# Patient Record
Sex: Male | Born: 1960 | Race: Black or African American | Hispanic: No | Marital: Single | Smoking: Current every day smoker
Health system: Southern US, Community
[De-identification: ages and names within clinical notes are randomized; demographics above are authoritative.]

## PROBLEM LIST (undated history)

## (undated) DIAGNOSIS — F32A Depression, unspecified: Secondary | ICD-10-CM

## (undated) DIAGNOSIS — I10 Essential (primary) hypertension: Secondary | ICD-10-CM

## (undated) DIAGNOSIS — F329 Major depressive disorder, single episode, unspecified: Secondary | ICD-10-CM

## (undated) DIAGNOSIS — I639 Cerebral infarction, unspecified: Secondary | ICD-10-CM

## (undated) DIAGNOSIS — Z8673 Personal history of transient ischemic attack (TIA), and cerebral infarction without residual deficits: Secondary | ICD-10-CM

## (undated) DIAGNOSIS — Z720 Tobacco use: Secondary | ICD-10-CM

## (undated) HISTORY — DX: Personal history of transient ischemic attack (TIA), and cerebral infarction without residual deficits: Z86.73

## (undated) HISTORY — DX: Depression, unspecified: F32.A

## (undated) HISTORY — DX: Essential (primary) hypertension: I10

## (undated) HISTORY — DX: Tobacco use: Z72.0

## (undated) HISTORY — DX: Major depressive disorder, single episode, unspecified: F32.9

---

## 1981-12-11 HISTORY — PX: OTHER SURGICAL HISTORY: SHX169

## 2005-02-19 ENCOUNTER — Emergency Department: Payer: Self-pay | Admitting: Emergency Medicine

## 2005-02-21 ENCOUNTER — Emergency Department: Payer: Self-pay | Admitting: Emergency Medicine

## 2008-04-04 ENCOUNTER — Emergency Department: Payer: Self-pay | Admitting: Emergency Medicine

## 2008-05-05 ENCOUNTER — Other Ambulatory Visit: Payer: Self-pay

## 2008-05-05 ENCOUNTER — Emergency Department: Payer: Self-pay | Admitting: Internal Medicine

## 2009-01-25 ENCOUNTER — Emergency Department: Payer: Self-pay | Admitting: Emergency Medicine

## 2009-05-07 ENCOUNTER — Emergency Department: Payer: Self-pay | Admitting: Unknown Physician Specialty

## 2009-09-22 ENCOUNTER — Emergency Department: Payer: Self-pay | Admitting: Emergency Medicine

## 2010-01-19 ENCOUNTER — Emergency Department: Payer: Self-pay | Admitting: Emergency Medicine

## 2010-10-28 ENCOUNTER — Ambulatory Visit: Payer: Self-pay | Admitting: Cardiovascular Disease

## 2010-10-28 ENCOUNTER — Inpatient Hospital Stay: Payer: Self-pay | Admitting: Internal Medicine

## 2010-11-17 ENCOUNTER — Encounter: Payer: Self-pay | Admitting: Family Medicine

## 2010-12-11 ENCOUNTER — Encounter: Payer: Self-pay | Admitting: Family Medicine

## 2011-01-11 ENCOUNTER — Encounter: Payer: Self-pay | Admitting: Family Medicine

## 2011-02-02 ENCOUNTER — Emergency Department: Payer: Self-pay | Admitting: Emergency Medicine

## 2011-02-09 ENCOUNTER — Encounter: Payer: Self-pay | Admitting: Family Medicine

## 2011-10-23 ENCOUNTER — Inpatient Hospital Stay: Payer: Self-pay | Admitting: Family Medicine

## 2012-06-25 LAB — COMPREHENSIVE METABOLIC PANEL
Albumin: 3.7 g/dL (ref 3.4–5.0)
Alkaline Phosphatase: 72 U/L (ref 50–136)
Anion Gap: 9 (ref 7–16)
BUN: 11 mg/dL (ref 7–18)
Bilirubin,Total: 0.3 mg/dL (ref 0.2–1.0)
Calcium, Total: 8.6 mg/dL (ref 8.5–10.1)
Chloride: 108 mmol/L — ABNORMAL HIGH (ref 98–107)
EGFR (Non-African Amer.): >60
Glucose: 75 mg/dL (ref 65–99)
SGOT(AST): 19 U/L (ref 15–37)
SGPT (ALT): 22 U/L

## 2012-06-25 LAB — URINALYSIS, COMPLETE
Bacteria: NONE SEEN
Blood: NEGATIVE
Ketone: NEGATIVE
Leukocyte Esterase: NEGATIVE
Ph: 6 (ref 4.5–8.0)
Protein: NEGATIVE
RBC,UR: NONE SEEN /HPF (ref 0–5)

## 2012-06-25 LAB — CBC
HCT: 47.5 % (ref 40.0–52.0)
MCH: 28.9 pg (ref 26.0–34.0)
MCHC: 32.1 g/dL (ref 32.0–36.0)
MCV: 90 fL (ref 80–100)
RDW: 14.7 % — ABNORMAL HIGH (ref 11.5–14.5)

## 2012-06-26 ENCOUNTER — Observation Stay: Payer: Self-pay | Admitting: Family Medicine

## 2012-06-27 LAB — LIPID PANEL
Cholesterol: 165 mg/dL (ref 0–200)
HDL Cholesterol: 62 mg/dL — ABNORMAL HIGH (ref 40–60)
Ldl Cholesterol, Calc: 76 mg/dL (ref 0–100)

## 2014-03-09 ENCOUNTER — Inpatient Hospital Stay: Payer: Self-pay | Admitting: Family Medicine

## 2014-03-09 LAB — URINALYSIS, COMPLETE
BILIRUBIN, UR: NEGATIVE
BLOOD: NEGATIVE
Bacteria: NONE SEEN
Glucose,UR: NEGATIVE mg/dL (ref 0–75)
KETONE: NEGATIVE
LEUKOCYTE ESTERASE: NEGATIVE
Nitrite: NEGATIVE
PH: 6 (ref 4.5–8.0)
PROTEIN: NEGATIVE
Specific Gravity: 1.013 (ref 1.003–1.030)
Squamous Epithelial: NONE SEEN
WBC UR: NONE SEEN /HPF (ref 0–5)

## 2014-03-09 LAB — CBC WITH DIFFERENTIAL/PLATELET
BASOS ABS: 0 10*3/uL (ref 0.0–0.1)
Basophil %: 0.6 %
EOS ABS: 0.2 10*3/uL (ref 0.0–0.7)
EOS PCT: 3.1 %
HCT: 49.1 % (ref 40.0–52.0)
HGB: 16 g/dL (ref 13.0–18.0)
LYMPHS ABS: 1 10*3/uL (ref 1.0–3.6)
Lymphocyte %: 13.8 %
MCH: 28.2 pg (ref 26.0–34.0)
MCHC: 32.6 g/dL (ref 32.0–36.0)
MCV: 87 fL (ref 80–100)
MONO ABS: 0.5 x10 3/mm (ref 0.2–1.0)
MONOS PCT: 6.9 %
Neutrophil #: 5.2 10*3/uL (ref 1.4–6.5)
Neutrophil %: 75.6 %
PLATELETS: 300 10*3/uL (ref 150–440)
RBC: 5.67 10*6/uL (ref 4.40–5.90)
RDW: 14.5 % (ref 11.5–14.5)
WBC: 6.9 10*3/uL (ref 3.8–10.6)

## 2014-03-09 LAB — BASIC METABOLIC PANEL
ANION GAP: 3 — AB (ref 7–16)
BUN: 13 mg/dL (ref 7–18)
CALCIUM: 8.7 mg/dL (ref 8.5–10.1)
CO2: 27 mmol/L (ref 21–32)
Chloride: 107 mmol/L (ref 98–107)
Creatinine: 1.2 mg/dL (ref 0.60–1.30)
EGFR (Non-African Amer.): >60
Glucose: 100 mg/dL — ABNORMAL HIGH (ref 65–99)
OSMOLALITY: 274 (ref 275–301)
POTASSIUM: 3.6 mmol/L (ref 3.5–5.1)
SODIUM: 137 mmol/L (ref 136–145)

## 2014-03-09 LAB — PROTIME-INR
INR: 0.9
PROTHROMBIN TIME: 12.4 s (ref 11.5–14.7)

## 2014-03-09 LAB — TROPONIN I: Troponin-I: <0.02 ng/mL

## 2014-03-09 LAB — APTT: ACTIVATED PTT: 29.4 s (ref 23.6–35.9)

## 2014-03-10 ENCOUNTER — Ambulatory Visit: Payer: Self-pay | Admitting: Neurology

## 2014-03-10 LAB — LIPID PANEL
Cholesterol: 177 mg/dL (ref 0–200)
HDL Cholesterol: 57 mg/dL (ref 40–60)
LDL CHOLESTEROL, CALC: 100 mg/dL (ref 0–100)
TRIGLYCERIDES: 102 mg/dL (ref 0–200)
VLDL Cholesterol, Calc: 20 mg/dL (ref 5–40)

## 2014-03-26 ENCOUNTER — Encounter: Payer: Self-pay | Admitting: Family Medicine

## 2014-04-10 ENCOUNTER — Encounter: Payer: Self-pay | Admitting: Family Medicine

## 2014-09-20 ENCOUNTER — Emergency Department: Payer: Self-pay | Admitting: Emergency Medicine

## 2014-09-20 ENCOUNTER — Inpatient Hospital Stay: Payer: Self-pay | Admitting: Family Medicine

## 2014-09-20 LAB — CBC WITH DIFFERENTIAL/PLATELET
Basophil #: 0 10*3/uL (ref 0.0–0.1)
Basophil %: 0.3 %
EOS ABS: 0.3 10*3/uL (ref 0.0–0.7)
Eosinophil %: 2.3 %
HCT: 53.6 % — ABNORMAL HIGH (ref 40.0–52.0)
HGB: 17.5 g/dL (ref 13.0–18.0)
Lymphocyte #: 0.6 10*3/uL — ABNORMAL LOW (ref 1.0–3.6)
Lymphocyte %: 4.9 %
MCH: 28.1 pg (ref 26.0–34.0)
MCHC: 32.7 g/dL (ref 32.0–36.0)
MCV: 86 fL (ref 80–100)
Monocyte #: 0.7 x10 3/mm (ref 0.2–1.0)
Monocyte %: 5.4 %
NEUTROS PCT: 87.1 %
Neutrophil #: 11.3 10*3/uL — ABNORMAL HIGH (ref 1.4–6.5)
PLATELETS: 309 10*3/uL (ref 150–440)
RBC: 6.23 10*6/uL — ABNORMAL HIGH (ref 4.40–5.90)
RDW: 15 % — AB (ref 11.5–14.5)
WBC: 13 10*3/uL — ABNORMAL HIGH (ref 3.8–10.6)

## 2014-09-20 LAB — COMPREHENSIVE METABOLIC PANEL
ALK PHOS: 76 U/L
AST: 25 U/L (ref 15–37)
Albumin: 3.7 g/dL (ref 3.4–5.0)
Anion Gap: 12 (ref 7–16)
BUN: 15 mg/dL (ref 7–18)
Bilirubin,Total: 0.4 mg/dL (ref 0.2–1.0)
CALCIUM: 8.8 mg/dL (ref 8.5–10.1)
Chloride: 102 mmol/L (ref 98–107)
Co2: 28 mmol/L (ref 21–32)
Creatinine: 1.34 mg/dL — ABNORMAL HIGH (ref 0.60–1.30)
GFR CALC NON AF AMER: 59 — AB
Glucose: 136 mg/dL — ABNORMAL HIGH (ref 65–99)
Osmolality: 286 (ref 275–301)
Potassium: 3.5 mmol/L (ref 3.5–5.1)
SGPT (ALT): 22 U/L
Sodium: 142 mmol/L (ref 136–145)
Total Protein: 7.2 g/dL (ref 6.4–8.2)

## 2014-09-20 LAB — TROPONIN I: Troponin-I: <0.02 ng/mL

## 2014-09-20 LAB — TSH: Thyroid Stimulating Horm: 0.365 u[IU]/mL — ABNORMAL LOW

## 2014-09-20 LAB — PRO B NATRIURETIC PEPTIDE: B-TYPE NATIURETIC PEPTID: 80 pg/mL (ref 0–125)

## 2014-09-21 LAB — CBC WITH DIFFERENTIAL/PLATELET
Basophil #: 0 10*3/uL (ref 0.0–0.1)
Basophil %: 0.3 %
EOS PCT: 0 %
Eosinophil #: 0 10*3/uL (ref 0.0–0.7)
HCT: 45.9 % (ref 40.0–52.0)
HGB: 14.8 g/dL (ref 13.0–18.0)
LYMPHS PCT: 1.6 %
Lymphocyte #: 0.2 10*3/uL — ABNORMAL LOW (ref 1.0–3.6)
MCH: 27.9 pg (ref 26.0–34.0)
MCHC: 32.2 g/dL (ref 32.0–36.0)
MCV: 87 fL (ref 80–100)
MONO ABS: 0.3 x10 3/mm (ref 0.2–1.0)
Monocyte %: 2.1 %
NEUTROS PCT: 96 %
Neutrophil #: 14.2 10*3/uL — ABNORMAL HIGH (ref 1.4–6.5)
Platelet: 306 10*3/uL (ref 150–440)
RBC: 5.31 10*6/uL (ref 4.40–5.90)
RDW: 14.9 % — ABNORMAL HIGH (ref 11.5–14.5)
WBC: 14.8 10*3/uL — ABNORMAL HIGH (ref 3.8–10.6)

## 2014-09-21 LAB — LIPID PANEL
Cholesterol: 150 mg/dL (ref 0–200)
HDL: 76 mg/dL — AB (ref 40–60)
LDL CHOLESTEROL, CALC: 66 mg/dL (ref 0–100)
Triglycerides: 40 mg/dL (ref 0–200)
VLDL Cholesterol, Calc: 8 mg/dL (ref 5–40)

## 2014-09-21 LAB — BASIC METABOLIC PANEL
Anion Gap: 9 (ref 7–16)
BUN: 11 mg/dL (ref 7–18)
CALCIUM: 8.5 mg/dL (ref 8.5–10.1)
CHLORIDE: 110 mmol/L — AB (ref 98–107)
CO2: 24 mmol/L (ref 21–32)
CREATININE: 1.22 mg/dL (ref 0.60–1.30)
EGFR (African American): >60
Glucose: 131 mg/dL — ABNORMAL HIGH (ref 65–99)
OSMOLALITY: 286 (ref 275–301)
Potassium: 3.4 mmol/L — ABNORMAL LOW (ref 3.5–5.1)
Sodium: 143 mmol/L (ref 136–145)

## 2014-09-21 LAB — T4, FREE: Free Thyroxine: 0.96 ng/dL (ref 0.76–1.46)

## 2015-03-30 NOTE — H&P (Signed)
PATIENT NAME:  Cory Harvey, Cory Harvey MR#:  [id number] DATE OF BIRTH:  07/21/1961  DATE OF ADMISSION:  06/26/2012  PRIMARY CARE PHYSICIAN: Dr. Burnadette PopLinthavong   CHIEF COMPLAINT: Dizziness and slurred speech times 5 days.   HISTORY OF PRESENT ILLNESS: Cory Harvey is a 54 year old African American male with history of multiple strokes. The last admission here was in November 2012 when he presented with slurred speech and his CT scan showed left parietal lobe infarct at that time. The patient presents now with five to six-day history of dizzy spells associated with slurred speech. The patient states that he has a baseline of slurred speech but it got worse to the extent that he has to repeat his statements so that people will understand him. He stayed at home hoping that this would go away. However, because it is recurrent and persistent he decided to come and seek medical advice. CT scan here revealed the presence of old strokes, but no new finding. The patient was admitted for further evaluation and management. At this point he does not have any new focal weakness.     REVIEW OF SYSTEMS:  CONSTITUTIONAL: Denies any fever. No chills. No fatigue. EYES: No blurring of vision. No double vision. ENT: No hearing impairment. No sore throat. No dysphagia. CARDIOVASCULAR: No chest pain. No shortness of breath. No edema. No syncope. RESPIRATORY: No shortness of breath. No cough. No sputum production. No hemoptysis. GASTROINTESTINAL: No abdominal pain. No vomiting. No diarrhea. GENITOURINARY: No dysuria. No frequency of urination. MUSCULOSKELETAL: No joint swelling or tenderness. No muscle pain or swelling. INTEGUMENT: No skin rash. No ulcers. NEURO: He has no focal weakness on either side of his body. No seizure activity. No headache. He has dizziness, and slurred speech was reported. PSYCH: No anxiety. No depression. ENDOCRINE: No polyuria, no polydipsia. No heat or cold intolerance.   PAST MEDICAL HISTORY:  1. History of  multiple strokes. The last one was in November 2012.  2. Systemic hypertension.  3. Past history of depression. 4. Tobacco abuse.     FAMILY HISTORY: His father of is still alive. He is at the age of 55 and he has diabetes mellitus. His mother is still alive at the age of [id number]. She has hypertension.   SOCIAL HABITS: He continues to smoke about eight cigarettes a day. He drinks beer, about 4 ounces 3 times a week. No other drug abuse.   SOCIAL HISTORY: He is single, unemployed, living on disability.   ADMISSION MEDICATIONS: The patient stated that he is on three medications. He does not recall the names but they are the same since he was discharged from the hospital in November. According to his records he is supposed to be on: 1. Norvasc 10 mg a day. 2. Losartan 50 mg a day. 3. Aggrenox 1 capsule twice a day.   ALLERGIES: No known drug allergies.   PHYSICAL EXAMINATION:  VITAL SIGNS: Blood pressure 154/102, respiratory rate 16, pulse 70, temperature 98, oxygen saturation 100%.   GENERAL APPEARANCE: Middle-aged male laying in bed in no acute distress.   HEAD: No pallor. No icterus. No cyanosis.   EARS, NOSE, AND THROAT: Hearing was normal. Nasal mucosa, lips, tongue were normal.   EYES: Normal eyelids and conjunctivae. Pupils about 5 mm, equal, reactive to light.   NECK: Supple. Trachea at midline. No thyromegaly. No cervical lymphadenopathy. No masses.   HEART: Normal S1, S2. No S3 or S4. No murmur. No gallop. No carotid bruits.   RESPIRATORY: Normal breathing  pattern without use of accessory muscles. No rales. No wheezing.   ABDOMEN: Soft without tenderness. No hepatosplenomegaly. No masses. No hernias.   SKIN: No ulcers. No subcutaneous nodules.   MUSCULOSKELETAL: No joint swelling. No clubbing.   NEURO: Cranial nerves II through XII are intact. His speech is slightly slurred. Coordination movements were normal. No focal motor deficit.   PSYCHIATRIC: The patient is alert  and oriented times three. Mood and affect were normal.   LABORATORY, DIAGNOSTIC, AND RADIOLOGICAL DATA: CT scan of the head showed old pontine lacunar infarct, old right basal ganglia lacunar infarct, old left parietal lobe infarct. There are chronic white matter ischemic changes. No evidence of an acute intracranial abnormality. His EKG showed normal sinus rhythm at a rate of 79 per minute, left axis deviation, incomplete right bundle branch block, otherwise unremarkable EKG. His blood work-up showed glucose of 75, repeat 86. BUN 11, creatinine 1.2, sodium 140, potassium 3.6. Normal liver function tests, normal CPK and troponin. CBC showed white count of 7000, hemoglobin 15, hematocrit 47, platelet count 262. Urinalysis was unremarkable.   ASSESSMENT:  1. Dizziness and slurred speech. The patient reports that these are the same symptoms when he had a stroke last time. His CT scan showed multiple old strokes but no new changes. The presentation is highly suspicious for possibly small stroke that is not evident by CT scan now, even though his symptoms started about 5 to 6 days ago.  2. Systemic hypertension, uncontrolled.  3. History of multiple strokes, as above.  4. Hyperlipidemia.  5. Tobacco abuse.   PLAN: Since the patient's symptoms are not new and started 5 to 6 days ago, I admitted him for observation. Since the CT scan showed no changes, I ordered an MRI of the head. I will continue for the time being with Aggrenox 1 capsule twice a day.  Of note, the patient had failed before using Plavix and in November it was changed to Aggrenox. I would like to mention that the patient had adequate work-up for stroke in November 2012. This included CT scan of the head, MRI of the head, carotid ultrasound which showed no hemodynamic significant stenosis, and he had also an echocardiogram in November 2012, which was essentially normal. I will increase the dose of his losartan from 50 mg to 100 mg for better  blood pressure control. I will increase his pravastatin from 10 mg to 20 mg. I advised the patient to discontinue smoking and counseled for that. I also offered a nicotine patch but he refused, stating that it will cause him to have tachycardia. I consulted neurology for further input.   TIME SPENT EVALUATING THIS PATIENT: More than 55 minutes.    ____________________________ Carney Corners. Rudene Re, MD amd:bjt D: 06/26/2012 03:16:56 ET T: 06/26/2012 08:27:14 ET JOB#: [id number]  cc: Carney Corners. Rudene Re, MD, <Dictator> Marisue Ivan, MD Karolee Ohs Dala Dock MD ELECTRONICALLY SIGNED 06/28/2012 4:54

## 2015-03-30 NOTE — Discharge Summary (Signed)
PATIENT NAME:  Cory Harvey, Cory Harvey MR#:  [id number] DATE OF BIRTH:  1960-12-18  DATE OF ADMISSION:  06/26/2012 DATE OF DISCHARGE:  06/27/2012  DISCHARGE DIAGNOSES:  1. Speech deficit.  2. History of cerebrovascular accident.  3. Hypertension.  4. Tobacco dependence.  5. History of depression.   DISCHARGE MEDICATIONS:  1. Pravastatin 10 mg p.o. at bedtime.  2. Aggrenox 200/25, 1 capsule p.o. b.i.d.  3. Losartan 100 mg p.o. daily, this is an increase.   CONSULTS: None.   LABORATORY, DIAGNOSTIC AND RADIOLOGICAL DATA: He had a CT of the head that was negative, MRI of the brain that was negative.   Pertinent labs on day of discharge: LDL 76. Other labs within normal limits. MRI of the brain was negative for acute infarct.   HOSPITAL COURSE:  1. Speech deficit. Patient initially came in with complaints of worsening speech deficit from his baseline. He has known history of cerebrovascular accident therefore it was thought that this may be a new onset of cerebrovascular accident. His initial CT of the head was negative. His initial MRI of the brain was negative. He had no other focal deficits neurologically except for the speech impediment/deficit. I discussed the case with Dr. Sherryll BurgerShah who agrees to see the patient as outpatient.  2. Hypertension. Patient initially came in with elevated blood pressure. He was started on amlodipine 10 mg and also increased losartan to 100 mg the next day. His blood pressure dropped to the 90s, the amlodipine was discontinued. He was continued on losartan 100 mg p.o. daily. Will need to follow this as an outpatient as we may need to titrate this down.  3. Hyperlipidemia. It has remained stable. His LDL is 76. Will continue on the home regimen with the pravastatin 10 mg at bedtime.   DISPOSITION: He is in stable condition to be discharged to home.   FOLLOW UP: Follow up with Dr. Sherryll BurgerShah this week. Follow up with Dr. Burnadette PopLinthavong within 14 days.    ____________________________ Marisue IvanKanhka Parlee Amescua, MD kl:cms D: 06/27/2012 10:03:58 ET T: 06/27/2012 14:37:31 ET JOB#: [id number]  cc: Marisue IvanKanhka Videl Nobrega, MD, <Dictator>  Marisue IvanKANHKA Clete Kuch MD ELECTRONICALLY SIGNED 07/11/2012 8:31

## 2015-04-03 NOTE — Discharge Summary (Signed)
PATIENT NAME:  Cory Harvey, Seldon J MR#:  [id number] DATE OF BIRTH:  07-07-1961  DATE OF ADMISSION:  03/09/2014 DATE OF DISCHARGE:  03/11/2014  DISCHARGE DIAGNOSIS: Acute cerebrovascular accident with residual dysarthria.   DISCHARGE MEDICATIONS:  1.  Aggrenox 25/200, 1 capsule p.o. b.i.d.  2.  Pravastatin 10 mg p.o. daily.   MEDICATIONS TO HOLD: AT THIS TIME: Losartan and hydrochlorothiazide.   CONSULTS: Neurology.   PROCEDURES: The patient had an MRI that showed acute infarct of the left corpus callosum and gyrus and left parietal lobe. Carotid ultrasound showed no stenosis. CT of the head showed no acute abnormalities. Echo showed an ejection fraction of 55% to 60% with no acute abnormalities. White blood cell count 6.9, hemoglobin 16, platelets of 300. LDL of 100.   BRIEF HOSPITAL COURSE: Acute CVA. The patient came in with difficulty with speech consistent with dysarthria. Initial CT was negative, but an MRI showed an acute infarct in 3 different areas. Neurology did see the patient and did not make significant changes to his regimen. They did recommend possibly switching to aspirin, but after I spoke with the patient, he said that he would be able to afford the Aggrenox, so we will continue on that as he has failed Plavix and aspirin in the past. We will continue with the statin therapy as well. His blood pressure remained low during his hospital stay, therefore we are holding his blood pressure medications at this time. He is going to follow up with me next in the office. We will find a way to help him with medication assistance through the manufacturer if possible.   DISPOSITION: He is in stable condition and will be discharged to home. I did not see a speech therapy note in the chart, but he is eating well and his speech is improving. Therefore, he does not need any further therapy as an outpatient.  ____________________________ Marisue IvanKanhka Promyse Ardito, MD kl:aw D: 03/11/2014 08:30:53  ET T: 03/11/2014 08:41:02 ET JOB#: [id number]  cc: Marisue IvanKanhka Lynnelle Mesmer, MD, <Dictator> Marisue IvanKANHKA Tram Wrenn MD ELECTRONICALLY SIGNED 03/16/2014 10:40

## 2015-04-03 NOTE — Discharge Summary (Signed)
PATIENT NAME:  Cory Harvey, Cory Harvey MR#:  [id number] DATE OF BIRTH:  02/01/1961  DATE OF ADMISSION:  09/20/2014 DATE OF DISCHARGE:  09/21/2014  DISCHARGE DIAGNOSIS: Acute asthma exacerbation.   DISCHARGE MEDICATIONS:  1. Albuterol 90 mcg inhaler 2 puffs q.4 h. as needed for wheezing.  2. Advair 250/50 one puff b.i.d.  3. Aspirin 325 mg p.o. daily.  4. Azithromycin 250 mg p.o. daily x 4 more days.  5. Prednisone taper as directed.   CONSULTS: None.   PROCEDURES: None.   PERTINENT LABORATORIES AND STUDIES: Chest x-ray showed no acute process.   CT angiogram showed no PE and no acute process.   White blood cell count unavailable at this time.   BRIEF HOSPITAL COURSE: Acute asthma exacerbation. The patient initially came into the Emergency Room with what appeared to be acute dyspnea and hypoxemia, was asked to be admitted from the hospitalists. Once admitted, chest x-ray was performed which showed no acute abnormalities. Concern for a possible PE. CT of the chest showed no signs of a PE. It was confirmed that it was more likely acute asthma exacerbation. When questioning the patient, he says he has a history of asthma. Has remained afebrile and his sputum, breathing and wheezing has all improved since his hospital stay and treated with antibiotics, prednisone and nebulizer treatments.  Smoking cessation counseling. He will continue on the prednisone taper as directed. He will be on 4 more days of azithromycin. He will also continue with Advair 250/50 b.i.d. and then to use the albuterol inhaler as needed.   DISPOSITION: He is in stable condition and will be discharged to home. I will follow up with him in the clinic in 10 days.    ____________________________ Cory IvanKanhka Leray Garverick, MD kl:JT D: 09/21/2014 13:11:45 ET T: 09/21/2014 14:02:46 ET JOB#: [id number]  cc: Cory IvanKanhka Rabecka Brendel, MD, <Dictator>  Cory IvanKANHKA Maria Gallicchio MD ELECTRONICALLY SIGNED 09/28/2014 8:30

## 2015-04-03 NOTE — H&P (Signed)
PATIENT NAME:  Cory Harvey, Cory Harvey MR#:  [id number] DATE OF BIRTH:  1961/10/02  DATE OF ADMISSION:  09/20/2014  PRIMARY CARE PHYSICIAN: Marisue Ivan, MD  REFERRING EMERGENCY ROOM PHYSICIAN: Raelyn Ensign, MD   CHIEF COMPLAINT: Shortness of breath.   HISTORY OF PRESENT ILLNESS: The patient is a 54 year old African American male with past medical history of hypertension, stroke x 4 with chronic dysarthria, is presenting to the ED with a chief complaint of shortness of breath. The patient is reporting that he is experiencing shortness of breath associated with mild cough since yesterday evening. He denies any fever. No sick contacts. This morning, he came into the ED for shortness of breath and he was given Solu-Medrol 125 mg IV, magnesium sulfate IV and eventually discharged home with p.o. steroids. Unfortunately, the patient comes back to the ED in the afternoon as he could not afford the prescriptions. The patient started having shortness of breath again by noon, which prompted him to come back to the ED. The patient was given nebulizer treatments but still the patient being tachycardic with heart rate of 120. Another dose of Solu-Medrol 125 mg IV was given. Chest x-ray was clear. Hospitalist team is called to admit the patient. During my examination, the patient is resting comfortably and watching TV. Denies any shortness of breath or chest tightness. No other complaints. No family members. CT angiogram of the chest was ordered by the Emergency Department physician for hypoxia. However, the patient's pulse oximetry was at 95% to 96% on 2 liters of oxygen. When he came into the ED, his saturations were at 90%. No other complaints. The patient reports that he has chronic dysarthria, as he had 4 strokes in the past. The patient is not on any aspirin. He admits that he smokes at least 10 cigarettes per day. No other complaints.   PAST MEDICAL HISTORY: Hypertension, history of stroke x 4 with chronic  dysarthria.  PAST SURGICAL HISTORY: Left kidney surgery during his childhood.   ALLERGIES: No known drug allergies.   PSYCHOSOCIAL HISTORY: Lives at home, lives alone. Smokes 10 cigarettes per day. Occasional intake of alcohol. Denies any drugs.   FAMILY HISTORY: Mother had CVA.   HOME MEDICATIONS: Prednisone 50 mg p.o. once daily, albuterol 2 puffs inhalation every 4 hours as needed.   REVIEW OF SYSTEMS: CONSTITUTIONAL: Denies any fever, fatigue, weakness. EYES: Denies blurry vision, double vision.  EARS, NOSE AND THROAT: Denies epistaxis, discharge.  RESPIRATORY: Denies cough today, but had a cough yesterday. Never diagnosed with COPD, but has chronic history of asthma. CARDIAC: Denies chest pain, palpitations. GASTROINTESTINAL: Denies nausea, vomiting, diarrhea, abdominal pain.  GENITOURINARY: No dysuria or hematuria.  ENDOCRINE: Denies polyuria, nocturia, thyroid problems.  HEMATOLOGICAL: Denies anemia, easy bruising, bleeding.  INTEGUMENTARY: No acne, rash, lesions.  MUSCULOSKELETAL: No joint pain in the back and neck. Denies gout. NEUROLOGIC: Has old history of stroke x 4, chronic dysarthria. Denies any vertigo or ataxia.  PSYCHIATRIC: Normal mood and affect.  PHYSICAL EXAMINATION:  VITAL SIGNS: Temperature 98.4, pulse 111, respirations 22-30, blood pressure 158/77, pulse oximetry is 90% on room air, 99% on 2 liters.  GENERAL APPEARANCE: Not in acute distress. Moderately built, thin-looking male in no apparent distress.  HEENT: Normocephalic, atraumatic. Pupils are equally reacting to light and accommodation. No scleral icterus. No conjunctival injection. No sinus tenderness. No postnasal drip. Dry mucous membranes. NECK: Supple. No JVD. No thyromegaly.  LUNGS: Bilateral diffuse wheezing is present, but no accessory muscle use and no anterior  chest wall tenderness on palpation.  CARDIAC: S1, S2 normal. Regular rate and rhythm, tachycardic.  GASTROINTESTINAL: Soft. Bowel sounds  are positive in all 4 quadrants. Nontender, nondistended. No hepatosplenomegaly. No masses.  NEUROLOGIC: Awake, alert and oriented x 3. Cranial nerves II-XII are grossly intact. Motor and sensory are intact. Chronic dysarthria is present from old history of stroke. Reflexes are 2+.  EXTREMITIES: No edema, cyanosis, clubbing.  SKIN: Warm to touch. Normal turgor. No rashes. No lesions.  MUSCULOSKELETAL: No joint effusion, tenderness, erythema.  PSYCHIATRIC: Normal mood and affect.   LABORATORY DATA AND IMAGING STUDIES: Chest x-ray, no acute findings. Twelve-lead EKG: Normal sinus rhythm with tachycardia, no acute ST-T wave changes. LFTs are normal. Troponin less than 0.02. TSH is below normal at 0.365. WBC 13.0, hemoglobin is normal, hematocrit of 53.6, platelets are 309,000. BMP: Glucose 136, BUN normal, creatinine 1.34. Sodium, potassium and chloride are normal. Anion gap is at 12. BNP is at 80.  ASSESSMENT AND PLAN: A 54 year old pleasant male is presenting to the ED with a chief complaint of shortness of breath, started yesterday, seen by the ER physician around 5:00 a.m. today, received Solu-Medrol 125 mg IV, magnesium sulfate and some nebulizer treatments and eventually discharged home. The patient could not afford to buy any prescriptions that were given in the ED, and came back to the ED with shortness of breath by afternoon.  1.  Acute exacerbation of asthma with underlying chronic obstructive pulmonary disease, but never diagnosed with chronic obstructive pulmonary disease, chronic smoker. We will admit him to off unit telemetry. Solu-Medrol 125 mg IV was given in the a.m. and another dose of Solu-Medrol 125 mg IV was given by the ED physician in the noon. We will continue Solu-Medrol at 40 mg IV q. 6 hours. Provide nebulizer treatments and Zithromax.  2.  Acute kidney injury. Renal function is slightly off. We will provide IV fluids.  3.  TSH is below normal. We will check free T4 and T3.  4.   Hypertension, not on any home medications. Started him on amlodipine.  5.  Chronic history of cerebrovascular accident x 4 in the past with chronic dysarthria. The patient is not on any home medications, started him on aspirin.  6.  Nicotine abuse. Counseled the patient to quit smoking for more than 3 minutes. We will provide a nicotine patch.   We will transfer the patient to Dr. Burnadette PopLinthavong, his primary care physician. Diagnosis and plan of care was discussed in detail with the patient. He is aware of the plan.   TOTAL TIME SPENT: Forty-five minutes.   CODE STATUS: He is full code.   POWER OF ATTORNEY: Mom is the medical power of attorney.   ____________________________ Ramonita LabAruna Aeva Posey, MD ag:TT D: 09/20/2014 18:20:14 ET T: 09/20/2014 19:52:29 ET JOB#: [id number]  cc: Ramonita LabAruna Vali Capano, MD, <Dictator> Marisue IvanKanhka Linthavong, MD Ramonita LabARUNA Teyana Pierron MD ELECTRONICALLY SIGNED 09/21/2014 15:52

## 2015-04-03 NOTE — H&P (Signed)
PATIENT NAME:  Cory Harvey, Cory Harvey MR#:  [id number] DATE OF BIRTH:  Jan 20, 1961  DATE OF ADMISSION:  03/09/2014  REFERRING PHYSICIAN:  Kandee Keenory R. York CeriseForbach, MD  ATTENDING PHYSICIAN:  Marisue IvanKanhka Linthavong, MD, at [facility].   CHIEF COMPLAINT: Slurred speech.   HISTORY OF PRESENT ILLNESS:  A 54 year old African American male with past medical history of CVA x 3, presented with slurred speech with acute onset around 9:00 a.m. of slurred speech. Denies any further symptoms. His symptoms are slowly improving. Speech he has difficulty getting words out and wants to persist. Denies any headache, weakness or paralysis. In the Emergency Department, initial CT within normal limits.   REVIEW OF SYSTEMS:    CONSTITUTIONAL: Denies fever, fatigue, weakness.  EYES: No blurry vision, double vision, eye pain.  EARS, NOSE, THROAT: Denies tinnitus, ear pain, hearing loss.  RESPIRATORY: Denies cough, wheeze, shortness of breath.  CARDIOVASCULAR:  Denies chest pain, palpitations, edema.  GASTROINTESTINAL:  Denies nausea, vomiting, diarrhea, abdominal pain.   GENITOURINARY:  No dysuria or hematuria.  ENDOCRINE: Denies nocturia or thyroid problems. HEMATOLOGIC AND LYMPHATIC:  Denies easy bruising or bleeding.  SKIN: Denies rash or lesion. MUSCULOSKELETAL: Denies pain in neck, back, shoulders, knees or arthritic symptoms NEUROLOGIC: Positive for slurred speech. Denies numbness, weakness, tremor or headache. PSYCHIATRIC:  Denies anxiety or depressive symptoms.   Otherwise, full review of systems performed by me is negative.   PAST MEDICAL HISTORY: CVA, hypertension and asthma.   SOCIAL HISTORY: Positive for tobacco as well as alcohol use.   FAMILY HISTORY: Positive for diabetes, hypertension.   ALLERGIES: No known drug allergies.   HOME MEDICATIONS: Include losartan 100 mg p.o. daily, pravastatin 10 mg p.o. daily, hydrochlorothiazide 12.5 mg p.o. daily, as well as he is supposed to taking Aggrenox 25/200 mg p.o.  b.i.d. which he has self-discontinued.   PHYSICAL EXAMINATION: VITAL SIGNS: Temperature 98.1, heart rate 80, respirations 20, blood pressure 141/95, saturating 97% on room air. Weight 83.9 kg, BMI 27.3.  GENERAL: Well-nourished, well-developed, African American gentleman, currently in no acute distress.  HEAD: Normocephalic, atraumatic.  EYES: Pupils equal, round and reactive to light.  Extraocular muscles intact.  No scleral icterus.  MOUTH: Moist mucosal membranes and dentition intact. No abscess noted. EARS, NOSE AND THROAT:  Throat without exudate or lesions.  NECK: Supple. No thyromegaly. No nodules. No JVD.  PULMONARY: Clear to auscultation bilaterally. No wheezes, rales, rhonchi. No use of accessory muscles.  chest non tender to palpation.  CARDIOVASCULAR: S1, S2, regular rate and rhythm. No murmurs, rubs or gallops. No edema. Pedal pulses 2+ bilaterally.   GI:  soft, nontender, nondistended. No masses. Positive hepatosplenomegaly.  MUSCULOSKELETAL: No swelling, clubbing, edema. Range of motion full in all extremities.  NEUROLOGICAL: Cranial nerves II through XII intact. Sensation intact. Reflexes intact. Strength 5 out of 5 in all extremities including proximal and distal flexor and extensor muscle groups. Pronator drift normal. Speech; however, is slurred and jumbled.  SKIN: No ulcerations, lesions, rash, cyanosis. Skin warm, dry. Turgor intact.  PSYCHIATRIC: Mood and affect within normal limits.  The patient is alert and oriented x 3.  Insight and judgment intact.   LABORATORY, DIAGNOSTIC AND RADIOLOGICAL DATA:  Sodium 137, potassium 3.6, chloride 107, bicarb 27, BUN 13, creatinine 1.2, glucose 100. Troponin I less than 0.02. WBC 6.9, hemoglobin 16, platelets 300. Urinalysis negative for evidence of infection. CT head performed revealing mild atrophy, no acute intracranial process.   ASSESSMENT AND PLAN: A 54 year old [place] gentleman with history  of cerebrovascular accidents,  presenting with slurred speech.  1.  Cerebrovascular accident.  Admitted to telemetry under observation. Restart Aggrenox medication as he self-discontinued it. Continue statin therapy. Check MRI, lipid panel, transthoracic echocardiogram, carotid Dopplers. Neuro checks q.4 hours, permissive hypertension, treating blood pressure if greater than 220/120 or symptomatic. Hold on heparin.  2.  Hypertension. Hold medications for first 24 to 48 hours for permissive hypertension described above.  3.  Deep venous thrombosis prophylaxis with sequential compression devices.  4.  CODE STATUS: The patient is full code.   TIME SPENT: 45 minutes.    ____________________________ Cletis Athens. Roston Grunewald, MD dkh:cs D: 03/09/2014 20:42:00 ET T: 03/09/2014 20:54:17 ET JOB#: [id number]  cc: Cletis Athens. Melenda Bielak, MD, <Dictator> Mang Hazelrigg Synetta Shadow MD ELECTRONICALLY SIGNED 03/09/2014 23:55

## 2015-04-03 NOTE — Consult Note (Signed)
PATIENT NAME:  Cory Harvey, Cory Harvey MR#:  [id number] DATE OF BIRTH:  1961/09/29  DATE OF CONSULTATION:  03/10/2014  REFERRING PHYSICIAN:   CONSULTING PHYSICIAN:  Cory BrownsYuriy Sariah Henkin, MD  REASON FOR EVALUATION:  History of stroke.   HISTORY OF PRESENT ILLNESS:  This is a 54 year old African American male with past medical history of strokes in the past apparently x 3 episodes, last one was about 3 years ago without any residual symptoms, presenting with slurred speech, with acute onset yesterday. When questioned, slurred speech more described as expressive aphasia. The patient states he has difficulty getting words out that he wants to state. No focal motor deficits. No sensory deficits. No facial droop was noted. When further questioned, the patient was on Aggrenox b.i.d., which he had stopped taking for well over a year prior to coming to the hospital. The patient states he could not afford Aggrenox. Currently, symptoms improved, but still not at baseline.   IMAGING:  The patient is status post MRI of the brain that has left corpus callosum and left parietal lobe infarct. The patient is status post carotids done. There are no signs of hemodynamic stenosis.   LABORATORY:  Work-up has been reviewed.   CT scan the head no acute intracranial abnormalities.   REVIEW OF SYSTEMS: Denies fever or fatigue. Denies any blurry vision, eye pain double vision throughout. Denies any tinnitus, ear pain. Denies any cough, wheezing or shortness of breath.  CARDIOVASCULAR: Denies any chest pain. No palpitations.  GASTROINTESTINAL: Denies nausea, vomiting, diarrhea.  GENITOURINARY: No dysuria, hematuria.  ENDOCRINE: Denies any easy bruising.  MUSCULOSKELETAL: No joint pain.  NEUROLOGICAL: History of strokes and slurred speech.  PSYCHIATRIC: Denies any anxiety or depression.   ALLERGIES: No known drug allergies.   PAST FAMILY HISTORY:  Positive for a history of diabetes and hypertension.   SOCIAL HISTORY: Positive  for tobacco and alcohol use.   HOME MEDICATIONS: Include losartan, pravastatin, hydrochlorothiazide and Aggrenox. I am not sure how much of these medications he is taking. Difficulty obtaining them for financial reasons.   PHYSICAL EXAMINATION:  VITAL SIGNS: Include a temperature of 98.1, pulse 75, respirations 18, blood pressure 102/72, pulse oximetry 96% on room air.  NEUROLOGICAL:  On cranial nerve examination, extraocular movements are intact. Pupils 4 mm to 3 mm, reactive bilaterally. Facial sensation intact. Facial motor is intact. The patient appears to have expressive aphasia with mild dysarthria that is noted. Shoulder shrug intact. Motor strength examination appears to be 5/5 bilateral upper and lower extremities. Coordination: Finger-to-nose intact. Sensation is intact to light touch and temperature. Gait not assessed.   IMPRESSION: A 54 year old [place] male with a history of multiple strokes in the past as well as history of hypertension, presenting with new onset of expressive aphasia, which has improved, but still not at baseline. The patient was supposed to be on Aggrenox at home, but stopped it because of inability to afford it.   PLAN: With further discussing with the patient, it does appear that the patient will have difficulty obtaining Aggrenox in the future. Would switch the patient to aspirin 325 daily, which he appeared to collaborate with, as he is able to obtain that over-the-counter. The patient should be started on statin. Discharge in the near future. No further neurological testing at this point.   Please call with any questions.    ____________________________ Cory BrownsYuriy Mehar Sagen, MD yz:tc D: 03/10/2014 18:41:00 ET T: 03/10/2014 19:37:57 ET JOB#: [id number]  cc: Cory BrownsYuriy Orton Capell, MD, <Dictator> Cory BrownsYURIY Maurica Omura MD  ELECTRONICALLY SIGNED 03/13/2014 11:34

## 2017-04-09 ENCOUNTER — Encounter: Payer: Self-pay | Admitting: Emergency Medicine

## 2017-04-09 ENCOUNTER — Emergency Department
Admission: EM | Admit: 2017-04-09 | Discharge: 2017-04-09 | Disposition: A | Discharge status: Home / Self Care | Payer: Medicare Other | Attending: Student in an Organized Health Care Education/Training Program | Admitting: Student in an Organized Health Care Education/Training Program

## 2017-04-09 DIAGNOSIS — R1031 Right lower quadrant pain: Secondary | ICD-10-CM | POA: Diagnosis present

## 2017-04-09 DIAGNOSIS — K4090 Unilateral inguinal hernia, without obstruction or gangrene, not specified as recurrent: Secondary | ICD-10-CM

## 2017-04-09 DIAGNOSIS — F17210 Nicotine dependence, cigarettes, uncomplicated: Secondary | ICD-10-CM | POA: Insufficient documentation

## 2017-04-09 HISTORY — DX: Cerebral infarction, unspecified: I63.9

## 2017-04-09 LAB — URINALYSIS, COMPLETE (UACMP) WITH MICROSCOPIC
BILIRUBIN URINE: NEGATIVE
Bacteria, UA: NONE SEEN
Glucose, UA: NEGATIVE mg/dL
HGB URINE DIPSTICK: NEGATIVE
KETONES UR: NEGATIVE mg/dL
LEUKOCYTES UA: NEGATIVE
NITRITE: NEGATIVE
PH: 5 (ref 5.0–8.0)
Protein, ur: NEGATIVE mg/dL
SPECIFIC GRAVITY, URINE: 1.023 (ref 1.005–1.030)
Squamous Epithelial / LPF: NONE SEEN

## 2017-04-09 MED ORDER — POLYETHYLENE GLYCOL 3350 17 G PO PACK: 17.0000 g | PACK | Freq: Every day | ORAL | 0 refills | Status: DC

## 2017-04-09 NOTE — ED Provider Notes (Signed)
[facility] Emergency Department Provider Note    None    (approximate)  I have reviewed the triage vital signs and the nursing notes.   HISTORY  Chief Complaint Hernia    HPI Cory Harvey is a 56 y.o. male with swelling to the right groin area for about a month. States it'll intermittently cause some discomfort.  States he came in today because while walking he had some pain. He is still moving his bowels. No nausea or vomiting. The pain is mild. Denies any dysuria. No flank pain. No previous surgeries. Has not seen his doctor about this.   Past Medical History:  Diagnosis Date  . Stroke [facility])    No family history on file. History reviewed. No pertinent surgical history. There are no active problems to display for this patient.     Prior to Admission medications   Not on File    Allergies Patient has no known allergies.    Social History Social History  Substance Use Topics  . Smoking status: Current Every Day Smoker    Packs/day: 0.50    Types: Cigarettes  . Smokeless tobacco: Never Used  . Alcohol use Yes     Comment: occasional     Review of Systems Patient denies headaches, rhinorrhea, blurry vision, numbness, shortness of breath, chest pain, edema, cough, abdominal pain, nausea, vomiting, diarrhea, dysuria, fevers, rashes or hallucinations unless otherwise stated above in HPI. ____________________________________________   PHYSICAL EXAM:  VITAL SIGNS: Vitals:   04/09/17 1303  BP: (!) 159/91  Pulse: 72  Resp: 16  Temp: 98.7 F (37.1 C)    Constitutional: Alert and oriented. Well appearing and in no acute distress. Eyes: Conjunctivae are normal. PERRL. EOMI. Head: Atraumatic. Nose: No congestion/rhinnorhea. Mouth/Throat: Mucous membranes are moist.  Oropharynx non-erythematous. Neck: No stridor. Painless ROM. No cervical spine tenderness to palpation Hematological/Lymphatic/Immunilogical: No cervical  lymphadenopathy. Cardiovascular: Normal rate, regular rhythm. Grossly normal heart sounds.  Good peripheral circulation. Respiratory: Normal respiratory effort.  No retractions. Lungs CTAB. Gastrointestinal: Soft and nontender. Right inguinal hernia easily reducible.No distention. No abdominal bruits. No CVA tenderness. Musculoskeletal: No lower extremity tenderness nor edema.  No joint effusions. Neurologic:  Normal speech and language. Skin:  Skin is warm, dry and intact. No rash noted. Psychiatric: Mood and affect are normal. Speech and behavior are normal.  ____________________________________________   LABS (all labs ordered are listed, but only abnormal results are displayed)  Results for orders placed or performed during the hospital encounter of 04/09/17 (from the past 24 hour(s))  Urinalysis, Complete w Microscopic     Status: Abnormal   Collection Time: 04/09/17  1:06 PM  Result Value Ref Range   Color, Urine YELLOW (A) YELLOW   APPearance CLEAR (A) CLEAR   Specific Gravity, Urine 1.023 1.005 - 1.030   pH 5.0 5.0 - 8.0   Glucose, UA NEGATIVE NEGATIVE mg/dL   Hgb urine dipstick NEGATIVE NEGATIVE   Bilirubin Urine NEGATIVE NEGATIVE   Ketones, ur NEGATIVE NEGATIVE mg/dL   Protein, ur NEGATIVE NEGATIVE mg/dL   Nitrite NEGATIVE NEGATIVE   Leukocytes, UA NEGATIVE NEGATIVE   RBC / HPF 0-5 0 - 5 RBC/hpf   WBC, UA 0-5 0 - 5 WBC/hpf   Bacteria, UA NONE SEEN NONE SEEN   Squamous Epithelial / LPF NONE SEEN NONE SEEN   Mucous PRESENT    ____________________________________________  ____________________________________________  RADIOLOGY   ____________________________________________   PROCEDURES  Procedure(s) performed:  Procedures    Critical  Care performed: no ____________________________________________   INITIAL IMPRESSION / ASSESSMENT AND PLAN / ED COURSE  Pertinent labs & imaging results that were available during my care of the patient were reviewed by me  and considered in my medical decision making (see chart for details).  DDX: hernia, incarcerated hernia, strangulated hernia  Cory Harvey is a 56 y.o. who presents to the ED with acute right lower quadrant pain and swelling to his inguinal groin area.  Patient is AFVSS in ED. Exam as above. Given current presentation have considered the above differential. Patient with evidence of a reducible right inguinal hernia. There is no evidence of incarceration or strangulation. Patient is well-appearing and in no acute distress. Denies any other complaints. Will provided referral to general surgery and started on laxatives.  Have discussed with the patient and available family all diagnostics and treatments performed thus far and all questions were answered to the best of my ability. The patient demonstrates understanding and agreement with plan.       ____________________________________________   FINAL CLINICAL IMPRESSION(S) / ED DIAGNOSES  Final diagnoses:  Inguinal hernia of right side without obstruction or gangrene      NEW MEDICATIONS STARTED DURING THIS VISIT:  New Prescriptions   No medications on file     Note:  This document was prepared using Dragon voice recognition software and may include unintentional dictation errors.    Willy Eddy, MD 04/09/17 [phone]

## 2017-04-09 NOTE — ED Notes (Signed)
Pt presents with hernia to inguinal area x85month. Denies pain other than discomfort with ambulation. Pt in NAD at this time

## 2017-04-09 NOTE — ED Triage Notes (Addendum)
Pt to ED via POV from home with c/o hernia to inguinal area. Pt states present x62month. Denies any difficult urination or abd pain. Pt states " uncomfortable" with ambulation. Pt NAD at this time.

## 2017-04-13 ENCOUNTER — Inpatient Hospital Stay: Payer: Medicare Other | Admitting: Surgery

## 2017-04-18 DIAGNOSIS — I639 Cerebral infarction, unspecified: Secondary | ICD-10-CM

## 2017-04-18 DIAGNOSIS — Z720 Tobacco use: Secondary | ICD-10-CM | POA: Insufficient documentation

## 2017-04-18 DIAGNOSIS — F32A Depression, unspecified: Secondary | ICD-10-CM | POA: Insufficient documentation

## 2017-04-18 DIAGNOSIS — F329 Major depressive disorder, single episode, unspecified: Secondary | ICD-10-CM | POA: Insufficient documentation

## 2017-04-18 DIAGNOSIS — I10 Essential (primary) hypertension: Secondary | ICD-10-CM

## 2017-04-18 DIAGNOSIS — Z8673 Personal history of transient ischemic attack (TIA), and cerebral infarction without residual deficits: Secondary | ICD-10-CM | POA: Insufficient documentation

## 2017-04-19 ENCOUNTER — Ambulatory Visit (INDEPENDENT_AMBULATORY_CARE_PROVIDER_SITE_OTHER): Payer: Medicare Other | Admitting: Surgery

## 2017-04-19 ENCOUNTER — Encounter: Payer: Self-pay | Admitting: Surgery

## 2017-04-19 VITALS — BP 152/101 | HR 64 | Temp 97.4°F | Ht 69.0 in | Wt 177.0 lb

## 2017-04-19 DIAGNOSIS — K4090 Unilateral inguinal hernia, without obstruction or gangrene, not specified as recurrent: Secondary | ICD-10-CM

## 2017-04-19 NOTE — Progress Notes (Signed)
04/19/2017  Reason for Visit:  Right inguinal hernia  History of Present Illness: Cory Harvey is a 56 y.o. male who presents with a 2 month history of a new right inguinal hernia.  The patient reports that he was lifting some wood and then felt a tingling sensation over the right groin.  He denies having any similar symptoms or any bulging prior to that.  Over the last two months, he feels that the bulging is getting bigger.  He notices it more when he's walking and his hernia bulges more, and it's also bulging more at the end of the day.  He does have some mild to minimal discomfort.  The hernia is reducible on its own.  He denies any fevers, chills, chest pain, shortness of breath, nausea, vomiting, abdominal pain, constipation, or diarrhea.  He does report that occasionally he will have to hold his right groin when he's having a bowel movement so it does not bulge out.    He presented to the ED on 4/30 due to his hernia and was given MiraLax, which he is not taking.  Of note, he has a history of 4 strokes in the past, with the last recorded on 03/2014.  He says he saw his PCP last about a year ago, though there are no records of this.  He does not take any antiplatelet agents for stroke prevention.  He did have asthma a couple of years ago and was on an inhaler temporarily.    Past Medical History: Past Medical History:  Diagnosis Date  . Depression   . History of CVA (cerebrovascular accident)   . Hypertension   . Stroke (HCC)    x 4  . Tobacco abuse      Past Surgical History: Past Surgical History:  Procedure Laterality Date  . kidney surgery Left 1983    Home Medications: Prior to Admission medications   Not on File    Allergies: No Known Allergies  Social History:  reports that he has been smoking Cigarettes.  He has been smoking about 0.50 packs per day. He has never used smokeless tobacco. He reports that he drinks alcohol. He reports that he does not use drugs.    Family History: Family History  Problem Relation Age of Onset  . Diabetes Mother   . Heart disease Mother   . Hyperlipidemia Mother   . Hypertension Mother   . Stroke Mother   . Diabetes Father   . Hypertension Father   . Cancer Neg Hx     Review of Systems: Review of Systems  Constitutional: Negative for chills and fever.  HENT: Negative for hearing loss.   Eyes: Negative for blurred vision.  Respiratory: Negative for cough and shortness of breath.   Cardiovascular: Negative for chest pain and leg swelling.  Gastrointestinal: Negative for abdominal pain, constipation, nausea and vomiting.  Genitourinary: Negative for dysuria.  Musculoskeletal: Negative for myalgias.  Skin: Negative for rash.  Neurological: Negative for dizziness.  Psychiatric/Behavioral: Negative for depression.  All other systems reviewed and are negative.   Physical Exam BP (!) 152/101   Pulse 64   Temp 97.4 F (36.3 C) (Oral)   Ht 5\' 9"  (1.753 m)   Wt 80.3 kg (177 lb)   BMI 26.14 kg/m  CONSTITUTIONAL: No acute distress.  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 HEENT:  Normocephalic, atraumatic, extraocular motion intact. NECK: Trachea is midline, and there is no jugular  venous distension. RESPIRATORY:  Lungs are clear, and breath sounds are equal bilaterally. Normal respiratory effort without pathologic use of accessory muscles. CARDIOVASCULAR: Heart is regular without murmurs, gallops, or rubs. GI: The abdomen is soft, nondistended, nontender to palpation.  The patient has a right inguinal hernia that is easily reducible.  There is no tenderness with manipulation.  There is no hernia palpable on the left side. There were no palpable masses.  MUSCULOSKELETAL:  Normal muscle strength and tone in all four extremities.  No peripheral edema or cyanosis. SKIN: Skin turgor is normal. There are no pathologic skin lesions.  NEUROLOGIC:  Motor and  sensation is grossly normal.  Cranial nerves are grossly intact. PSYCH:  Alert and oriented to person, place and time. Affect is normal.  Laboratory Analysis: No results found for this or any previous visit (from the past 24 hour(s)).  Imaging: No results found.  Assessment and Plan: This is a 56 y.o. male who presents with a recent onset of a right inguinal hernia, which is reducible.  Discussed with the patient the different types of hernia and their severity classification, ranging from reducible to incarcerated to strangulated.  Currently his is reducible with minimal to no symptoms, and only some discomfort when it's bulging out.  Given his history of multiple strokes, currently not on any prevention medications, the concern is that he could also have cardiac disease that is unknown and other comorbidities that could complicate his surgical management.  Discussed with the patient that conservative management is also an option, particularly given the lack of any significant symptoms.  Discussed avoiding strenuous activity that could make the hernia bulge out more, quitting smoking, using MiraLax daily so that he has better and easier bowel movements so there is less straining, and using or wearing a hernia truss or underwear to help push on the hernia so it does not bulge out as easily.  The patient wants to try conservative management first, given his lack of significant symptoms and knowing his history as well.  If his symptoms seem to be worsening, then he can give us a call so we can see him again and discuss surgical management.  He would definitely require cardiac clearance prior to any intervention.  Instructions were given regarding what symptoms to look out for that would require urgent evaluation in ED.  The patient understands this plan and all of his questions have been answered.  Face-to-face time spent with the patient and care providers was 45 minutes, with more than 50% of the  time spent counseling, educating, and coordinating care of the patient.     [name], MD [facility]

## 2017-04-19 NOTE — Patient Instructions (Signed)
  Please call your Primary Care doctor and schedule an appointment.  Please start taking Miralax 17 G once a day for constipation.  Please go to your local pharmacy and inquire about a Truss to help with your inguinal hernia.     Inguinal Hernia, Adult An inguinal hernia is when fat or the intestines push through the area where the leg meets the lower belly (groin) and make a rounded lump (bulge). This condition happens over time. There are three types of inguinal hernias. These types include:  Hernias that can be pushed back into the belly (are reducible).  Hernias that cannot be pushed back into the belly (are incarcerated).  Hernias that cannot be pushed back into the belly and lose their blood supply (get strangulated). This type needs emergency surgery. Follow these instructions at home: Lifestyle   Drink enough fluid to keep your urine (pee) clear or pale yellow.  Eat plenty of fruits, vegetables, and whole grains. These have a lot of fiber. Talk with your doctor if you have questions.  Avoid lifting heavy objects.  Avoid standing for long periods of time.  Do not use tobacco products. These include cigarettes, chewing tobacco, or e-cigarettes. If you need help quitting, ask your doctor.  Try to stay at a healthy weight. General instructions   Do not try to force the hernia back in.  Watch your hernia for any changes in color or size. Let your doctor know if there are any changes.  Take over-the-counter and prescription medicines only as told by your doctor.  Keep all follow-up visits as told by your doctor. This is important. Contact a doctor if:  You have a fever.  You have new symptoms.  Your symptoms get worse. Get help right away if:  The area where the legs meets the lower belly has:  Pain that gets worse suddenly.  A bulge that gets bigger suddenly and does not go down.  A bulge that turns red or purple.  A bulge that is painful to the  touch.  You are a man and your scrotum:  Suddenly feels painful.  Suddenly changes in size.  You feel sick to your stomach (nauseous) and this feeling does not go away.  You throw up (vomit) and this keeps happening.  You feel your heart beating a lot more quickly than normal.  You cannot poop (have a bowel movement) or pass gas. This information is not intended to replace advice given to you by your health care provider. Make sure you discuss any questions you have with your health care provider. Document Released: 12/28/2006 Document Revised: 05/04/2016 Document Reviewed: 10/07/2014 Elsevier Interactive Patient Education  2017 [employer]

## 2017-09-02 ENCOUNTER — Emergency Department: Payer: Medicare Other

## 2017-09-02 ENCOUNTER — Observation Stay: Payer: Medicare Other

## 2017-09-02 ENCOUNTER — Encounter: Payer: Self-pay | Admitting: Emergency Medicine

## 2017-09-02 ENCOUNTER — Inpatient Hospital Stay
Admission: EM | Admit: 2017-09-02 | Discharge: 2017-09-04 | DRG: 065 | Disposition: A | Discharge status: Home / Self Care | Payer: Medicare Other | Attending: Internal Medicine | Admitting: Internal Medicine

## 2017-09-02 DIAGNOSIS — I639 Cerebral infarction, unspecified: Secondary | ICD-10-CM

## 2017-09-02 DIAGNOSIS — F17210 Nicotine dependence, cigarettes, uncomplicated: Secondary | ICD-10-CM | POA: Diagnosis present

## 2017-09-02 DIAGNOSIS — R269 Unspecified abnormalities of gait and mobility: Secondary | ICD-10-CM

## 2017-09-02 DIAGNOSIS — E785 Hyperlipidemia, unspecified: Secondary | ICD-10-CM | POA: Diagnosis present

## 2017-09-02 DIAGNOSIS — K219 Gastro-esophageal reflux disease without esophagitis: Secondary | ICD-10-CM | POA: Diagnosis present

## 2017-09-02 DIAGNOSIS — I63532 Cerebral infarction due to unspecified occlusion or stenosis of left posterior cerebral artery: Secondary | ICD-10-CM | POA: Diagnosis not present

## 2017-09-02 DIAGNOSIS — G8191 Hemiplegia, unspecified affecting right dominant side: Secondary | ICD-10-CM | POA: Diagnosis present

## 2017-09-02 DIAGNOSIS — Z8673 Personal history of transient ischemic attack (TIA), and cerebral infarction without residual deficits: Secondary | ICD-10-CM

## 2017-09-02 DIAGNOSIS — R4781 Slurred speech: Secondary | ICD-10-CM | POA: Diagnosis present

## 2017-09-02 DIAGNOSIS — I10 Essential (primary) hypertension: Secondary | ICD-10-CM | POA: Diagnosis present

## 2017-09-02 DIAGNOSIS — F329 Major depressive disorder, single episode, unspecified: Secondary | ICD-10-CM | POA: Diagnosis present

## 2017-09-02 DIAGNOSIS — R200 Anesthesia of skin: Secondary | ICD-10-CM | POA: Diagnosis not present

## 2017-09-02 DIAGNOSIS — R29702 NIHSS score 2: Secondary | ICD-10-CM | POA: Diagnosis present

## 2017-09-02 DIAGNOSIS — R531 Weakness: Secondary | ICD-10-CM

## 2017-09-02 DIAGNOSIS — Z79899 Other long term (current) drug therapy: Secondary | ICD-10-CM

## 2017-09-02 DIAGNOSIS — F32A Depression, unspecified: Secondary | ICD-10-CM | POA: Diagnosis present

## 2017-09-02 LAB — COMPREHENSIVE METABOLIC PANEL
ALK PHOS: 50 U/L (ref 38–126)
ALT: 12 U/L — ABNORMAL LOW (ref 17–63)
ANION GAP: 8 (ref 5–15)
AST: 17 U/L (ref 15–41)
Albumin: 3.8 g/dL (ref 3.5–5.0)
BILIRUBIN TOTAL: 0.5 mg/dL (ref 0.3–1.2)
BUN: 10 mg/dL (ref 6–20)
CO2: 24 mmol/L (ref 22–32)
CREATININE: 0.99 mg/dL (ref 0.61–1.24)
Calcium: 9 mg/dL (ref 8.9–10.3)
Chloride: 105 mmol/L (ref 101–111)
GFR calc Af Amer: >60 mL/min (ref >60)
Glucose, Bld: 102 mg/dL — ABNORMAL HIGH (ref 65–99)
POTASSIUM: 3.9 mmol/L (ref 3.5–5.1)
Sodium: 137 mmol/L (ref 135–145)
Total Protein: 6.7 g/dL (ref 6.5–8.1)

## 2017-09-02 LAB — CBC
HCT: 44.5 % (ref 40.0–52.0)
Hemoglobin: 15.3 g/dL (ref 13.0–18.0)
MCH: 28.5 pg (ref 26.0–34.0)
MCHC: 34.3 g/dL (ref 32.0–36.0)
MCV: 83.1 fL (ref 80.0–100.0)
Platelets: 378 10*3/uL (ref 150–440)
RBC: 5.36 MIL/uL (ref 4.40–5.90)
RDW: 15.1 % — ABNORMAL HIGH (ref 11.5–14.5)
WBC: 6 10*3/uL (ref 3.8–10.6)

## 2017-09-02 LAB — DIFFERENTIAL
Basophils Absolute: 0 10*3/uL (ref 0–0.1)
Basophils Relative: 1 %
EOS ABS: 0.2 10*3/uL (ref 0–0.7)
EOS PCT: 4 %
LYMPHS ABS: 0.9 10*3/uL — AB (ref 1.0–3.6)
Lymphocytes Relative: 15 %
MONOS PCT: 7 %
Monocytes Absolute: 0.4 10*3/uL (ref 0.2–1.0)
NEUTROS PCT: 73 %
Neutro Abs: 4.4 10*3/uL (ref 1.4–6.5)

## 2017-09-02 LAB — PROTIME-INR
INR: 0.98
Prothrombin Time: 12.9 seconds (ref 11.4–15.2)

## 2017-09-02 LAB — APTT: APTT: 30 s (ref 24–36)

## 2017-09-02 LAB — GLUCOSE, CAPILLARY: GLUCOSE-CAPILLARY: 112 mg/dL — AB (ref 65–99)

## 2017-09-02 LAB — TROPONIN I: Troponin I: <0.03 ng/mL (ref <0.03)

## 2017-09-02 MED ORDER — SODIUM CHLORIDE 0.9 % IV SOLN
INTRAVENOUS | Status: DC
  Administered 2017-09-02 – 2017-09-04 (×3): via INTRAVENOUS

## 2017-09-02 MED ORDER — ONDANSETRON HCL 4 MG PO TABS: 4.0000 mg | ORAL_TABLET | Freq: Four times a day (QID) | ORAL | Status: DC | PRN

## 2017-09-02 MED ORDER — ENOXAPARIN SODIUM 40 MG/0.4ML ~~LOC~~ SOLN
40.0000 mg | SUBCUTANEOUS | Status: DC
  Administered 2017-09-02 – 2017-09-03 (×2): 40 mg via SUBCUTANEOUS
  Filled 2017-09-02 (×2): qty 0.4

## 2017-09-02 MED ORDER — PANTOPRAZOLE SODIUM 40 MG PO TBEC
40.0000 mg | DELAYED_RELEASE_TABLET | Freq: Every day | ORAL | Status: DC
Start: 2017-09-02 — End: 2017-09-04
  Administered 2017-09-02 – 2017-09-04 (×3): 40 mg via ORAL
  Filled 2017-09-02 (×3): qty 1

## 2017-09-02 MED ORDER — ASPIRIN 81 MG PO CHEW
324.0000 mg | CHEWABLE_TABLET | Freq: Once | ORAL | Status: AC
  Administered 2017-09-02: 324 mg via ORAL
  Filled 2017-09-02: qty 4

## 2017-09-02 MED ORDER — DOCUSATE SODIUM 100 MG PO CAPS
100.0000 mg | ORAL_CAPSULE | Freq: Two times a day (BID) | ORAL | Status: DC
  Administered 2017-09-03 – 2017-09-04 (×3): 100 mg via ORAL
  Filled 2017-09-02 (×4): qty 1

## 2017-09-02 MED ORDER — ACETAMINOPHEN 650 MG RE SUPP: 650.0000 mg | Freq: Four times a day (QID) | RECTAL | Status: DC | PRN

## 2017-09-02 MED ORDER — ATORVASTATIN CALCIUM 20 MG PO TABS
10.0000 mg | ORAL_TABLET | Freq: Every day | ORAL | Status: DC
  Administered 2017-09-02 – 2017-09-03 (×2): 10 mg via ORAL
  Filled 2017-09-02 (×2): qty 1

## 2017-09-02 MED ORDER — LOSARTAN POTASSIUM 50 MG PO TABS
50.0000 mg | ORAL_TABLET | Freq: Every day | ORAL | Status: DC
  Administered 2017-09-03 – 2017-09-04 (×2): 50 mg via ORAL
  Filled 2017-09-02 (×2): qty 1

## 2017-09-02 MED ORDER — ONDANSETRON HCL 4 MG/2ML IJ SOLN: 4.0000 mg | Freq: Four times a day (QID) | INTRAMUSCULAR | Status: DC | PRN

## 2017-09-02 MED ORDER — BISACODYL 10 MG RE SUPP: 10.0000 mg | Freq: Every day | RECTAL | Status: DC | PRN

## 2017-09-02 MED ORDER — ACETAMINOPHEN 325 MG PO TABS: 650.0000 mg | ORAL_TABLET | Freq: Four times a day (QID) | ORAL | Status: DC | PRN

## 2017-09-02 MED ORDER — CLOPIDOGREL BISULFATE 75 MG PO TABS
75.0000 mg | ORAL_TABLET | Freq: Every day | ORAL | Status: DC
  Administered 2017-09-02 – 2017-09-04 (×3): 75 mg via ORAL
  Filled 2017-09-02 (×3): qty 1

## 2017-09-02 MED ORDER — SODIUM CHLORIDE 0.9 % IV SOLN
Freq: Once | INTRAVENOUS | Status: AC
  Administered 2017-09-02: 16:00:00 via INTRAVENOUS

## 2017-09-02 NOTE — ED Notes (Signed)
Patient denies pain and is resting comfortably.  

## 2017-09-02 NOTE — H&P (Signed)
History and Physical    Cory Harvey ZOX:[id number] DOB: 1961-09-18 DOA: 09/02/2017  Referring physician: Dr. Derrill Kay PCP: Cory Ivan, MD  Specialists: none  Chief Complaint: right-sided weakness  HPI: Cory Harvey is a 56 y.o. male has a past medical history significant for CVA, HTN, and tobacco use now with transient right-sided weakness with slurred speech. Sx's now improving but MRI shows acute CVA. He is now admitted. No fever. Denies CP or SOB. No N/V/D. No change in bowels or bladder. Still smokes.  Review of Systems: The patient denies anorexia, fever, weight loss,, vision loss, decreased hearing, hoarseness, chest pain, syncope, dyspnea on exertion, peripheral edema, hemoptysis, abdominal pain, melena, hematochezia, severe indigestion/heartburn, hematuria, incontinence, genital sores, suspicious skin lesions, transient blindness, , depression, unusual weight change, abnormal bleeding, enlarged lymph nodes, angioedema, and breast masses.   Past Medical History:  Diagnosis Date  . Depression   . History of CVA (cerebrovascular accident)   . Hypertension   . Stroke (HCC)    x 4  . Tobacco abuse    Past Surgical History:  Procedure Laterality Date  . kidney surgery Left 1983   Social History:  reports that he has been smoking Cigarettes.  He has been smoking about 0.50 packs per day. He has never used smokeless tobacco. He reports that he drinks alcohol. He reports that he does not use drugs.  No Known Allergies  Family History  Problem Relation Age of Onset  . Diabetes Mother   . Heart disease Mother   . Hyperlipidemia Mother   . Hypertension Mother   . Stroke Mother   . Diabetes Father   . Hypertension Father   . Cancer Neg Hx     Prior to Admission medications   Not on File   Physical Exam: Vitals:   09/02/17 1539 09/02/17 1548 09/02/17 1600 09/02/17 1630  BP:  126/88 133/90 (!) 116/99  Pulse:  74 (!) 55 (!) 56  Resp:  Temp:  98 F (36.7 C)     TempSrc:      SpO2:  100% 99% 100%  Weight:      Height:         General:  No apparent distress, WDWN, [place]/AT  Eyes: PERRL, EOMI, no scleral icterus, conjunctiva clear  ENT: moist oropharynx without exudate, TM's benign, dentition fair  Neck: supple, no lymphadenopathy. No bruits or thyromegaly  Cardiovascular: regular rate without MRG; 2+ peripheral pulses, no JVD, no peripheral edema  Respiratory: CTA biL, good air movement without wheezing, rhonchi or crackled. Respiratory effort normal  Abdomen: soft, non tender to palpation, positive bowel sounds, no guarding, no rebound  Skin: no rashes or lesions  Musculoskeletal: normal bulk and tone, no joint swelling  Psychiatric: normal mood and affect, A&OX3  Neurologic: CN 2-12 grossly intact, Motor strength 5/5 in all 4 groups with symmetric DTR's and non-focal sensory exam  Labs on Admission:  Basic Metabolic Panel:  Recent Labs Lab 09/02/17 1436  NA 137  K 3.9  CL 105  CO2 24  GLUCOSE 102*  BUN 10  CREATININE 0.99  CALCIUM 9.0   Liver Function Tests:  Recent Labs Lab 09/02/17 1436  AST 17  ALT 12*  ALKPHOS 50  BILITOT 0.5  PROT 6.7  ALBUMIN 3.8   No results for input(s): LIPASE, AMYLASE in the last 168 hours. No results for input(s): AMMONIA in the last 168 hours. CBC:  Recent Labs Lab 09/02/17 1436  WBC 6.0  NEUTROABS 4.4  HGB 15.3  HCT 44.5  MCV 83.1  PLT 378   Cardiac Enzymes:  Recent Labs Lab 09/02/17 1436  TROPONINI <0.03    BNP (last 3 results) No results for input(s): BNP in the last 8760 hours.  ProBNP (last 3 results) No results for input(s): PROBNP in the last 8760 hours.  CBG:  Recent Labs Lab 09/02/17 1405  GLUCAP 112*    Radiological Exams on Admission: Ct Head Code Stroke Wo Contrast  Result Date: 09/02/2017 CLINICAL DATA:  Code stroke.  Slurred speech, right-sided weakness EXAM: CT HEAD WITHOUT CONTRAST TECHNIQUE: Contiguous axial images  were obtained from the base of the skull through the vertex without intravenous contrast. COMPARISON:  None. FINDINGS: Brain: Ventricle size normal.  Mild atrophy. Extensive chronic microvascular ischemic change throughout the cerebral white matter. Chronic infarcts in the internal capsule on the right and thalamus on the left. Chronic infarct left parietal lobe. Chronic infarct in the right pons. Negative for acute infarct, hemorrhage, or mass lesion. Vascular: Negative for hyperdense vessel. Atherosclerotic calcification. Skull: Negative Sinuses/Orbits: Negative Other: None ASPECTS (Alberta Stroke Program Early CT Score) - Ganglionic level infarction (caudate, lentiform nuclei, internal capsule, insula, M1-M3 cortex): 7 - Supraganglionic infarction (M4-M6 cortex): 3 Total score (0-10 with 10 being normal): 10 IMPRESSION: 1. No acute abnormality.  Extensive chronic ischemia 2. ASPECTS is 10 These results were called by telephone at the time of interpretation on 09/02/2017 at 2:11 pm to Dr. [name] , who verbally acknowledged these results. Electronically Signed   By: [name]   On: 09/02/2017 14:11    EKG: Independently reviewed.  Assessment/Plan Principal Problem:   Acute CVA (cerebrovascular accident) [facility]) Active Problems:   Depression   Hypertension   Gait disturbance   Will observe on floor and begin Plavix. Order echo and carotids. Consult Neurology, PT, and CM. Begin Losartan and statin. Repeat labs in AM  Diet: soft Fluids: NS@75  DVT Prophylaxis: Lovenox  Code Status: FULL  Family Communication: none  Disposition Plan: home  Time spent: 50 min

## 2017-09-02 NOTE — ED Notes (Signed)
To [place] for doppler study.

## 2017-09-02 NOTE — ED Triage Notes (Signed)
Pt c/o right arm weakness and slurred speech worse than baseline starting at 7pm last night.  Has had 2 recent strokes per pt. Timeline was difficult to figure out. Initially pt reported symptoms started when waking this morning and when asked if was normal when went to bed he reports they started last night. When asked time he reprots 7 pm

## 2017-09-02 NOTE — ED Triage Notes (Signed)
1353-to CT scan with RN

## 2017-09-02 NOTE — ED Provider Notes (Signed)
[facility] Emergency Department Provider Note   ____________________________________________   I have reviewed the triage vital signs and the nursing notes.   HISTORY  Chief Complaint Code Stroke   History limited by: Not Limited   HPI Cory Harvey is a 56 y.o. male who presents to the emergency department today because of concerns for slurred speech and right-sided numbness and weakness. Patient states that this is been going on for about the past week. He states that he came in today because he thought it would have improved by now. The patient states that his weakness is in his right upper and lower extremity. It is not so severe that he can't use his extremities. He states he is still able to walk. In addition he has noticed some slurred speech. He does have a history of strokes and states that he has had for the past. He states he is currently not on any blood thinning medication or aspirin. He denies any recent fevers. No trauma.   Past Medical History:  Diagnosis Date  . Depression   . History of CVA (cerebrovascular accident)   . Hypertension   . Stroke (HCC)    x 4  . Tobacco abuse     Patient Active Problem List   Diagnosis Date Noted  . History of CVA (cerebrovascular accident) 04/18/2017  . Tobacco use 04/18/2017  . Depression   . Hypertension   . Stroke [facility])     Past Surgical History:  Procedure Laterality Date  . kidney surgery Left 1983    Prior to Admission medications   Not on File    Allergies Patient has no known allergies.  Family History  Problem Relation Age of Onset  . Diabetes Mother   . Heart disease Mother   . Hyperlipidemia Mother   . Hypertension Mother   . Stroke Mother   . Diabetes Father   . Hypertension Father   . Cancer Neg Hx     Social History Social History  Substance Use Topics  . Smoking status: Current Every Day Smoker    Packs/day: 0.50    Types: Cigarettes  . Smokeless  tobacco: Never Used  . Alcohol use Yes     Comment: occasional     Review of Systems Constitutional: No fever/chills Eyes: No visual changes. ENT: No sore throat. Cardiovascular: Denies chest pain. Respiratory: Denies shortness of breath. Gastrointestinal: No abdominal pain.  No nausea, no vomiting.  No diarrhea.   Genitourinary: Negative for dysuria. Musculoskeletal: Negative for back pain. Skin: Negative for rash. Neurological: positive for right-sided weakness, numbness. Positive for slurred speech.  ____________________________________________   PHYSICAL EXAM:  VITAL SIGNS: ED Triage Vitals  Enc Vitals Group     BP 09/02/17 1407 (!) 141/93     Pulse Rate 09/02/17 1407 60     Resp 09/02/17 1407 16     Temp 09/02/17 1408 98 F (36.7 C)     Temp Source 09/02/17 1408 Oral     SpO2 09/02/17 1407 96 %     Weight 09/02/17 1407 180 lb (81.6 kg)     Height 09/02/17 1407  (1.727 m)   Constitutional: Awake and alert. No acute distress. Eyes: Conjunctivae are normal.  ENT   Head: Normocephalic and atraumatic.   Nose: No congestion/rhinnorhea.   Mouth/Throat: Mucous membranes are moist.   Neck: No stridor. Hematological/Lymphatic/Immunilogical: No cervical lymphadenopathy. Cardiovascular: Normal rate, regular rhythm.  No murmurs, rubs, or gallops.  Respiratory: Normal respiratory effort  without tachypnea nor retractions. Breath sounds are clear and equal bilaterally. No wheezes/rales/rhonchi. Gastrointestinal: Soft and non tender. No rebound. No guarding.  Genitourinary: Deferred Musculoskeletal: Normal range of motion in all extremities. No lower extremity edema. Neurologic:  Slightly slurred speech. Face symmetric. Slight weakness to the right upper extremity with drift. Some drift to left lower extremity but can keep it above the bed. Subjective change in sensation to the right side.  Skin:  Skin is warm, dry and intact. No rash noted. Psychiatric: Mood  and affect are normal. Speech and behavior are normal. Patient exhibits appropriate insight and judgment.  ____________________________________________    LABS (pertinent positives/negatives)  Trop <0.03 CBC WBC: 6.0 Hgb: 15.3 Plt: 378 CMP Glu 102 Na 137 K 3.9 Cr 0.99  ____________________________________________   EKG  None  ____________________________________________    RADIOLOGY  CT head No acute process  ____________________________________________   PROCEDURES  Procedures  ____________________________________________   INITIAL IMPRESSION / ASSESSMENT AND PLAN / ED COURSE  Pertinent labs & imaging results that were available during my care of the patient were reviewed by me and considered in my medical decision making (see chart for details).  patient presented to the emergency department today because of concerns for possible stroke. Patient does have history of CVA. Symptoms have been going on for roughly 1 week. Patient was seen by teleneurologist. the telemetry neurologist did not recommend any acute intervention. I did recommend admission. They did recommend further testing.  ----------------------------------------- 3:39 PM on 09/02/2017 -----------------------------------------  Discussed with Dr. Judithann Sheen with hospital service about admission per neurology recommendation. At this point Dr. Judithann Sheen would like to defer admission pending MRI result.   ____________________________________________   FINAL CLINICAL IMPRESSION(S) / ED DIAGNOSES  Final diagnoses:  Numbness  Right sided weakness  History of CVA (cerebrovascular accident)     Note: This dictation was prepared with Dragon dictation. Any transcriptional errors that result from this process are unintentional     Phineas Semen, MD 09/02/17 1904

## 2017-09-03 ENCOUNTER — Inpatient Hospital Stay (HOSPITAL_COMMUNITY)
Admit: 2017-09-03 | Discharge: 2017-09-03 | Disposition: A | Discharge status: Home / Self Care | Payer: Medicare Other | Attending: Internal Medicine | Admitting: Internal Medicine

## 2017-09-03 DIAGNOSIS — R4781 Slurred speech: Secondary | ICD-10-CM | POA: Diagnosis present

## 2017-09-03 DIAGNOSIS — R200 Anesthesia of skin: Secondary | ICD-10-CM | POA: Diagnosis present

## 2017-09-03 DIAGNOSIS — G8191 Hemiplegia, unspecified affecting right dominant side: Secondary | ICD-10-CM | POA: Diagnosis present

## 2017-09-03 DIAGNOSIS — K219 Gastro-esophageal reflux disease without esophagitis: Secondary | ICD-10-CM | POA: Diagnosis present

## 2017-09-03 DIAGNOSIS — Z79899 Other long term (current) drug therapy: Secondary | ICD-10-CM | POA: Diagnosis not present

## 2017-09-03 DIAGNOSIS — I10 Essential (primary) hypertension: Secondary | ICD-10-CM | POA: Diagnosis present

## 2017-09-03 DIAGNOSIS — E785 Hyperlipidemia, unspecified: Secondary | ICD-10-CM | POA: Diagnosis present

## 2017-09-03 DIAGNOSIS — F329 Major depressive disorder, single episode, unspecified: Secondary | ICD-10-CM | POA: Diagnosis present

## 2017-09-03 DIAGNOSIS — I639 Cerebral infarction, unspecified: Secondary | ICD-10-CM

## 2017-09-03 DIAGNOSIS — Z8673 Personal history of transient ischemic attack (TIA), and cerebral infarction without residual deficits: Secondary | ICD-10-CM | POA: Diagnosis not present

## 2017-09-03 DIAGNOSIS — R29702 NIHSS score 2: Secondary | ICD-10-CM | POA: Diagnosis present

## 2017-09-03 DIAGNOSIS — F17210 Nicotine dependence, cigarettes, uncomplicated: Secondary | ICD-10-CM | POA: Diagnosis present

## 2017-09-03 DIAGNOSIS — I63532 Cerebral infarction due to unspecified occlusion or stenosis of left posterior cerebral artery: Secondary | ICD-10-CM | POA: Diagnosis present

## 2017-09-03 LAB — GLUCOSE, CAPILLARY: Glucose-Capillary: 89 mg/dL (ref 65–99)

## 2017-09-03 LAB — CBC
HCT: 43.9 % (ref 40.0–52.0)
Hemoglobin: 14.7 g/dL (ref 13.0–18.0)
MCH: 28.2 pg (ref 26.0–34.0)
MCHC: 33.5 g/dL (ref 32.0–36.0)
MCV: 84.1 fL (ref 80.0–100.0)
Platelets: 373 10*3/uL (ref 150–440)
RBC: 5.23 MIL/uL (ref 4.40–5.90)
RDW: 15.1 % — ABNORMAL HIGH (ref 11.5–14.5)
WBC: 6 10*3/uL (ref 3.8–10.6)

## 2017-09-03 LAB — ECHOCARDIOGRAM COMPLETE
Height: 69 in
Weight: 2904 oz

## 2017-09-03 LAB — COMPREHENSIVE METABOLIC PANEL
ALBUMIN: 3.2 g/dL — AB (ref 3.5–5.0)
ALK PHOS: 41 U/L (ref 38–126)
ALT: 11 U/L — AB (ref 17–63)
AST: 16 U/L (ref 15–41)
Anion gap: 4 — ABNORMAL LOW (ref 5–15)
BUN: 14 mg/dL (ref 6–20)
CALCIUM: 8.6 mg/dL — AB (ref 8.9–10.3)
CO2: 26 mmol/L (ref 22–32)
CREATININE: 0.98 mg/dL (ref 0.61–1.24)
Chloride: 109 mmol/L (ref 101–111)
GFR calc Af Amer: >60 mL/min (ref >60)
GFR calc non Af Amer: >60 mL/min (ref >60)
GLUCOSE: 82 mg/dL (ref 65–99)
Potassium: 3.9 mmol/L (ref 3.5–5.1)
SODIUM: 139 mmol/L (ref 135–145)
Total Bilirubin: 0.6 mg/dL (ref 0.3–1.2)
Total Protein: 5.7 g/dL — ABNORMAL LOW (ref 6.5–8.1)

## 2017-09-03 NOTE — Evaluation (Signed)
Physical Therapy Evaluation Patient Details Name: Cory Harvey MRN: [id number] DOB: 02-18-1961 Today's Date: 09/03/2017   History of Present Illness  Pt is a 56 y/o M who presented with R sided weakness and slurred speech.  MRI showed acute CVA.  Pt's PMH includes stroke x4, tobacco abuse, cancer.     Clinical Impression  Pt admitted with above diagnosis. Pt currently with functional limitations due to the deficits listed below (see PT Problem List). Cory Harvey presents with RLE strength and sensation deficits and recommend OT Eval and treat for evaluation of RUE and safety with ADLs.  Pt ambulates at min guard level of assist for safety due to instability noted with higher level balance activities.  Thus, recommending OPPT at d/c.  Reviewed the signs and symptoms of a stroke with the pt and what to do if these are noted.  Pt will benefit from skilled PT to increase their independence and safety with mobility to allow discharge to the venue listed below.      Follow Up Recommendations Outpatient PT    Equipment Recommendations  None recommended by PT    Recommendations for Other Services OT consult     Precautions / Restrictions Precautions Precautions: Fall Restrictions Weight Bearing Restrictions: No      Mobility  Bed Mobility Overal bed mobility: Independent             General bed mobility comments: No physical assist or cues needed.  Pt performs independently.   Transfers Overall transfer level: Needs assistance Equipment used: None Transfers: Sit to/from Stand Sit to Stand: Modified independent (Device/Increase time)         General transfer comment: Pt performs slowly and cautiously but no physical assist or cues needed.  Ambulation/Gait Ambulation/Gait assistance: Min guard Ambulation Distance (Feet): 200 Feet Assistive device: None Gait Pattern/deviations: Step-through pattern   Gait velocity interpretation: at or above normal speed for  age/gender General Gait Details: No signs of instability when ambulating without challenges to his balance.  However, pt demonstrates instability with higher level balance activities while ambulating as documented below.   Stairs            Wheelchair Mobility    Modified Rankin (Stroke Patients Only) Modified Rankin (Stroke Patients Only) Pre-Morbid Rankin Score: No significant disability Modified Rankin: Moderately severe disability     Balance Overall balance assessment: Needs assistance Sitting-balance support: No upper extremity supported;Feet supported Sitting balance-Leahy Scale: Good     Standing balance support: No upper extremity supported;During functional activity Standing balance-Leahy Scale: Fair Standing balance comment: Pt able to stand statically and ambulate without UE support but would likely lose his balance with perturbation.                  Standardized Balance Assessment Standardized Balance Assessment : Dynamic Gait Index   Dynamic Gait Index Level Surface: Normal Change in Gait Speed: Mild Impairment Gait with Horizontal Head Turns: Mild Impairment Gait with Vertical Head Turns: Mild Impairment Gait and Pivot Turn: Mild Impairment Step Over Obstacle: Mild Impairment Step Around Obstacles: Normal       Pertinent Vitals/Pain Pain Assessment: No/denies pain    Home Living Family/patient expects to be discharged to:: Private residence Living Arrangements: Alone Available Help at Discharge: Family;Available PRN/intermittently (brother works during the day) Type of Home: House Home Access: Stairs to enter Entrance Stairs-Rails: Left;Right;Can reach both Secretary/administrator of Steps: 6 Home Layout: One level Home Equipment: None      Prior Function  Level of Independence: Independent         Comments: Pt independent with all aspects of mobility. He does not use AD at baseline.  He is still driving.  No falls in the past 6  months.  Pt assists his mother with cooking and cleaning.      Hand Dominance   Dominant Hand: Left    Extremity/Trunk Assessment   Upper Extremity Assessment Upper Extremity Assessment: Defer to OT evaluation;RUE deficits/detail RUE Deficits / Details: R shoulder flexion, elbow flexion, grip strength 4/5    Lower Extremity Assessment Lower Extremity Assessment: RLE deficits/detail RLE Deficits / Details: Strength grossly 4/5 RLE Sensation: decreased light touch       Communication   Communication: Expressive difficulties  Cognition Arousal/Alertness: Awake/alert Behavior During Therapy: WFL for tasks assessed/performed Overall Cognitive Status: No family/caregiver present to determine baseline cognitive functioning Area of Impairment: Orientation;Memory;Following commands;Safety/judgement;Awareness;Problem solving                 Orientation Level: Disoriented to;Time   Memory: Decreased short-term memory Following Commands: Follows one step commands inconsistently Safety/Judgement: Decreased awareness of safety;Decreased awareness of deficits Awareness: Emergent Problem Solving: Slow processing;Requires verbal cues;Requires tactile cues        General Comments      Exercises Other Exercises Other Exercises: Encouraged pt to ambulate with nursing staff at least 3x/day    Assessment/Plan    PT Assessment Patient needs continued PT services  PT Problem List Decreased strength;Decreased balance;Decreased cognition;Decreased safety awareness       PT Treatment Interventions DME instruction;Gait training;Stair training;Functional mobility training;Therapeutic activities;Therapeutic exercise;Balance training;Neuromuscular re-education;Cognitive remediation;Patient/family education    PT Goals (Current goals can be found in the Care Plan section)  Acute Rehab PT Goals Patient Stated Goal: to get stronger PT Goal Formulation: With patient Time For Goal  Achievement: 09/17/17 Potential to Achieve Goals: Good    Frequency 7X/week   Barriers to discharge        Co-evaluation               AM-PAC PT "6 Clicks" Daily Activity  Outcome Measure Difficulty turning over in bed (including adjusting bedclothes, sheets and blankets)?: None Difficulty moving from lying on back to sitting on the side of the bed? : None Difficulty sitting down on and standing up from a chair with arms (e.g., wheelchair, bedside commode, etc,.)?: A Little Help needed moving to and from a bed to chair (including a wheelchair)?: A Little Help needed walking in hospital room?: A Little Help needed climbing 3-5 steps with a railing? : A Little 6 Click Score: 20    End of Session Equipment Utilized During Treatment: Gait belt Activity Tolerance: Patient tolerated treatment well Patient left: in bed;with call bell/phone within reach;with bed alarm set Nurse Communication: Mobility status PT Visit Diagnosis: Muscle weakness (generalized) (M62.81);Unsteadiness on feet (R26.81)    Time: 1610-9604 PT Time Calculation (min) (ACUTE ONLY): 33 min   Charges:   PT Evaluation $PT Eval Low Complexity: 1 Low PT Treatments $Gait Training: 8-22 mins   PT G Codes:   PT G-Codes **NOT FOR INPATIENT CLASS** Functional Assessment Tool Used: AM-PAC 6 Clicks Basic Mobility;Clinical judgement Functional Limitation: Mobility: Walking and moving around Mobility: Walking and Moving Around Current Status ([id number]): At least 20 percent but less than 40 percent impaired, limited or restricted Mobility: Walking and Moving Around Goal Status [phone]): At least 1 percent but less than 20 percent impaired, limited or restricted     Morrie Sheldon  Abashian PT, DPT 09/03/2017, 1:14 PM

## 2017-09-03 NOTE — Progress Notes (Signed)
[employer] - [place] at [facility]                                                                                                                                                                                  Patient Demographics   Cory Harvey, is a 56 y.o. male, DOB - 1961/09/05, IHK:[id number]  Admit date - 09/02/2017   Admitting Physician Marguarite Arbour, MD  Outpatient Primary MD for the patient is Marisue Ivan, MD   LOS - 0  Subjective: Patient continues to have some right-sided weakness. Slurred speech improved. Previous history of stroke    Review of Systems:   CONSTITUTIONAL: No documented fever. No fatigue, weakness. No weight gain, no weight loss.  EYES: No blurry or double vision.  ENT: No tinnitus. No postnasal drip. No redness of the oropharynx.  RESPIRATORY: No cough, no wheeze, no hemoptysis. No dyspnea.  CARDIOVASCULAR: No chest pain. No orthopnea. No palpitations. No syncope.  GASTROINTESTINAL: No nausea, no vomiting or diarrhea. No abdominal pain. No melena or hematochezia.  GENITOURINARY: No dysuria or hematuria.  ENDOCRINE: No polyuria or nocturia. No heat or cold intolerance.  HEMATOLOGY: No anemia. No bruising. No bleeding.  INTEGUMENTARY: No rashes. No lesions.  MUSCULOSKELETAL: No arthritis. No swelling. No gout.  NEUROLOGIC: Positive slurred speech and right-sided weakness PSYCHIATRIC: No anxiety. No insomnia. No ADD.    Vitals:   Vitals:   09/03/17 0220 09/03/17 0441 09/03/17 0629 09/03/17 0746  BP: (!) 131/97 (!) 141/92 136/84 135/84  Pulse: 61 (!) 59 72 (!) 51  Resp: Temp: 98.2 F (36.8 C) 98.2 F (36.8 C) 97.8 F (36.6 C) 98 F (36.7 C)  TempSrc:  Oral Oral Oral  SpO2: 100% 99% 97% 98%  Weight:      Height:        Wt Readings from Last 3 Encounters:  09/02/17 181 lb 8 oz (82.3 kg)  04/19/17 177 lb (80.3 kg)  04/09/17 185 lb (83.9 kg)     Intake/Output Summary (Last 24 hours) at 09/03/17  1332 Last data filed at 09/03/17 0900  Gross per 24 hour  Intake             1112 ml  Output                0 ml  Net             1112 ml    Physical Exam:   GENERAL: Pleasant-appearing in no apparent distress.  HEAD, EYES, EARS, NOSE AND THROAT: Atraumatic, normocephalic. Extraocular muscles are intact. Pupils equal and reactive to light. Sclerae anicteric. No conjunctival injection. No oro-pharyngeal erythema.  NECK: Supple. There is no jugular venous distention. No bruits, no lymphadenopathy, no thyromegaly.  HEART: Regular rate and rhythm,. No murmurs, no rubs, no clicks.  LUNGS: Clear to auscultation bilaterally. No rales or rhonchi. No wheezes.  ABDOMEN: Soft, flat, nontender, nondistended. Has good bowel sounds. No hepatosplenomegaly appreciated.  EXTREMITIES: No evidence of any cyanosis, clubbing, or peripheral edema.  +2 pedal and radial pulses bilaterally.  NEUROLOGIC: The patient is alert, awake, and oriented x3  right upper extremity or right lower extremity 4 out of 5, left lower extremity 4 out of 5 SKIN: Moist and warm with no rashes appreciated.  Psych: Not anxious, depressed LN: No inguinal LN enlargement    Antibiotics   Anti-infectives    None      Medications   Scheduled Meds: . atorvastatin  10 mg Oral q1800  . clopidogrel  75 mg Oral Daily  . docusate sodium  100 mg Oral BID  . enoxaparin (LOVENOX) injection  40 mg Subcutaneous Q24H  . losartan  50 mg Oral Daily  . pantoprazole  40 mg Oral Daily   Continuous Infusions: . sodium chloride 75 mL/hr at 09/03/17 0939   PRN Meds:.acetaminophen **OR** acetaminophen, bisacodyl, ondansetron **OR** ondansetron (ZOFRAN) IV   Data Review:   Micro Results No results found for this or any previous visit (from the past 240 hour(s)).  Radiology Reports [name]  Result Date: 09/02/2017 CLINICAL DATA:  Focal neuro deficit.  Right-sided weakness.  Stroke EXAM: MRI HEAD WITHOUT CONTRAST TECHNIQUE:  Multiplanar, multiecho pulse sequences of the brain and surrounding structures were obtained without intravenous contrast. COMPARISON:  CT head at 9:23 2018 FINDINGS: Brain: Small areas of acute infarction in the left posterior cerebral artery territory including the left thalamus, splenium of the corpus callosum on the left, left cerebral peduncle, and left occipital white matter. Extensive chronic microvascular ischemic change throughout the cerebral white matter bilaterally. Chronic infarcts in the left thalamus and right basal ganglia. Chronic infarct in the right pons. Chronic ischemia right thalamus. Negative for hemorrhage or mass. Ventricle size normal. Vascular: Normal arterial flow void Skull and upper cervical spine: Negative Sinuses/Orbits: Mucosal edema paranasal sinuses.  Normal orbit Other: None IMPRESSION: Acute left posterior cerebral artery infarct. Multiple small areas of infarction are present as described above in the left posterior cerebral artery territory. Extensive chronic ischemic change. Electronically Signed   By: [name]   On: 09/02/2017 17:58   US Carotid Bilateral  Result Date: 09/02/2017 CLINICAL DATA:  Stroke. EXAM: BILATERAL CAROTID DUPLEX ULTRASOUND TECHNIQUE: Wallace Cullens scale imaging, color Doppler and duplex ultrasound were performed of bilateral carotid and vertebral arteries in the neck. COMPARISON:  03/09/2014 as well as head CT 09/02/2017 and MRI brain 09/02/2017. FINDINGS: Criteria: Quantification of carotid stenosis is based on velocity parameters that correlate the residual internal carotid diameter with NASCET-based stenosis levels, using the diameter of the distal internal carotid lumen as the denominator for stenosis measurement. The following velocity measurements were obtained: RIGHT ICA:  80 cm/sec CCA:  83 cm/sec SYSTOLIC ICA/CCA RATIO:  1.0 DIASTOLIC ICA/CCA RATIO:  1.4 ECA:  69 Cm/sec LEFT ICA:  61 cm/sec CCA:  62 cm/sec SYSTOLIC ICA/CCA RATIO:  1.0  DIASTOLIC ICA/CCA RATIO:  2.2 ECA:  63 Cm/sec RIGHT CAROTID ARTERY: Common carotid artery is within normal with normal waveform. No significant atherosclerotic plaque over the right carotid bulb and internal carotid artery's. Normal Doppler waveforms. RIGHT VERTEBRAL ARTERY:  Normal antegrade flow. LEFT CAROTID ARTERY: Common  carotid artery is normal with normal Doppler waveforms. No significant atherosclerotic plaque at the left carotid bulb or internal carotid artery. Normal waveforms throughout the internal carotid artery. LEFT VERTEBRAL ARTERY:  Normal antegrade flow. IMPRESSION: No hemodynamically significant stenosis and no significant atherosclerotic plaque involving the common or internal carotid arteries bilaterally. Normal antegrade flow within the vertebral arteries. Electronically Signed   By: [name]   On: 09/02/2017 19:04   Ct Head Code Stroke Wo Contrast  Result Date: 09/02/2017 CLINICAL DATA:  Code stroke.  Slurred speech, right-sided weakness EXAM: CT HEAD WITHOUT CONTRAST TECHNIQUE: Contiguous axial images were obtained from the base of the skull through the vertex without intravenous contrast. COMPARISON:  None. FINDINGS: Brain: Ventricle size normal.  Mild atrophy. Extensive chronic microvascular ischemic change throughout the cerebral white matter. Chronic infarcts in the internal capsule on the right and thalamus on the left. Chronic infarct left parietal lobe. Chronic infarct in the right pons. Negative for acute infarct, hemorrhage, or mass lesion. Vascular: Negative for hyperdense vessel. Atherosclerotic calcification. Skull: Negative Sinuses/Orbits: Negative Other: None ASPECTS (Alberta Stroke Program Early CT Score) - Ganglionic level infarction (caudate, lentiform nuclei, internal capsule, insula, M1-M3 cortex): 7 - Supraganglionic infarction (M4-M6 cortex): 3 Total score (0-10 with 10 being normal): 10 IMPRESSION: 1. No acute abnormality.  Extensive chronic ischemia 2.  ASPECTS is 10 These results were called by telephone at the time of interpretation on 09/02/2017 at 2:11 pm to Dr. [name] , who verbally acknowledged these results. Electronically Signed   By: [name]   On: 09/02/2017 14:11     CBC  Recent Labs Lab 09/02/17 1436 09/03/17 0303  WBC 6.0 6.0  HGB 15.3 14.7  HCT 44.5 43.9  PLT 378 373  MCV 83.1 84.1  MCH 28.5 28.2  MCHC 34.3 33.5  RDW 15.1* 15.1*  LYMPHSABS 0.9*  --   MONOABS 0.4  --   EOSABS 0.2  --   BASOSABS 0.0  --     Chemistries   Recent Labs Lab 09/02/17 1436 09/03/17 0303  NA 137 139  K 3.9 3.9  CL 105 109  CO2 24 26  GLUCOSE 102* 82  BUN 10 14  CREATININE 0.99 0.98  CALCIUM 9.0 8.6*  AST 17 16  ALT 12* 11*  ALKPHOS 50 41  BILITOT 0.5 0.6   ------------------------------------------------------------------------------------------------------------------ estimated creatinine clearance is 83.2 mL/min (by C-G formula based on SCr of 0.98 mg/dL). ------------------------------------------------------------------------------------------------------------------ No results for input(s): HGBA1C in the last 72 hours. ------------------------------------------------------------------------------------------------------------------ No results for input(s): CHOL, HDL, LDLCALC, TRIG, CHOLHDL, LDLDIRECT in the last 72 hours. ------------------------------------------------------------------------------------------------------------------ No results for input(s): TSH, T4TOTAL, T3FREE, THYROIDAB in the last 72 hours.  Invalid input(s): FREET3 ------------------------------------------------------------------------------------------------------------------ No results for input(s): VITAMINB12, FOLATE, FERRITIN, TIBC, IRON, RETICCTPCT in the last 72 hours.  Coagulation profile  Recent Labs Lab 09/02/17 1436  INR 0.98    No results for input(s): DDIMER in the last 72 hours.  Cardiac Enzymes  Recent  Labs Lab 09/02/17 1436  TROPONINI <0.03   ------------------------------------------------------------------------------------------------------------------ Invalid input(s): POCBNP    Assessment & Plan  Patient is a 56 year old African-American male with recurrent CVA  1. Acute CVA Await neurology input Continue therapy with aspirin and Plavix Continue atorvastatin  2. Hyperlipidemia unspecified continue Lipitor  3. Hyperlipidemia continue Cozaar  4. GERD continue Protonix  5. Nicotine abuse smoking cessation provided to the patient 4 minutes spent- I strongly recommend he stop smoking Nicotine patch offered but he did not  want that    Code Status Orders        Start     Ordered   09/02/17 1937  Full code  Continuous     09/02/17 1936    Code Status History    Date Active Date Inactive Code Status Order ID Comments User Context   This patient has a current code status but no historical code status.           Consults neuro  DVT Prophylaxis  Lovenox    Lab Results  Component Value Date   PLT 373 09/03/2017     Time Spent in minutes    Greater than 50% of time spent in care coordination and counseling patient regarding the condition and plan of care.   Auburn Bilberry M.D on 09/03/2017 at 1:32 PM  Between 7am to 6pm - Pager - [phone]  After 6pm go to [email or web address] - password [email or web address] [facility]  [facility]   Office  [phone]

## 2017-09-03 NOTE — Care Management Important Message (Signed)
Important Message  Patient Details  Name: Cory Harvey MRN: [id number] Date of Birth: Mar 13, 1961   Medicare Important Message Given:  Yes  Signed IM notice given   Eber Hong, RN 09/03/2017, 10:55 AM

## 2017-09-03 NOTE — Plan of Care (Signed)
Problem: Education: Goal: Knowledge of [facility] Education information/materials will improve Outcome: Progressing NIH 2 for R-sided decreased sensation and dysarthria, though pt reports baseline.  Neuro checks stable.  No needs overnight.  Denies pain.  Bed in low position, call bell within reach.  WCTM.

## 2017-09-03 NOTE — Consult Note (Signed)
Referring Physician: Allena Katz    Chief Complaint: Slurred speech and right sided weakness  HPI: Cory Harvey is an 56 y.o. male with a history of multiple strokes who reports that last weekend he had the acute onset of slurred speech and right sided weakness.  He was hopeful that his symptoms would resolve as they have in the past but when they did not he presented for evaluation.  Initial NIHSS of 2. Reports that he has failed ASA in the past and Plavix was started but has been discontinued and it is unclear why.  Date last known well: Date: 08/26/2017 Time last known well: Unable to determine tPA Given: No: Outside time window  Past Medical History:  Diagnosis Date  . Depression   . History of CVA (cerebrovascular accident)   . Hypertension   . Stroke (HCC)    x 4  . Tobacco abuse     Past Surgical History:  Procedure Laterality Date  . kidney surgery Left 1983    Family History  Problem Relation Age of Onset  . Diabetes Mother   . Heart disease Mother   . Hyperlipidemia Mother   . Hypertension Mother   . Stroke Mother   . Diabetes Father   . Hypertension Father   . Cancer Neg Hx    Social History:  reports that he has been smoking Cigarettes.  He has been smoking about 0.50 packs per day. He has never used smokeless tobacco. He reports that he drinks alcohol. He reports that he does not use drugs.  Allergies: No Known Allergies  Medications:  I have reviewed the patient's current medications. Prior to Admission:  No prescriptions prior to admission.   Scheduled: . atorvastatin  10 mg Oral q1800  . clopidogrel  75 mg Oral Daily  . docusate sodium  100 mg Oral BID  . enoxaparin (LOVENOX) injection  40 mg Subcutaneous Q24H  . losartan  50 mg Oral Daily  . pantoprazole  40 mg Oral Daily    ROS: History obtained from the patient  General ROS: negative for - chills, fatigue, fever, night sweats, weight gain or weight loss Psychological ROS: negative for -  behavioral disorder, hallucinations, memory difficulties, mood swings or suicidal ideation Ophthalmic ROS: negative for - blurry vision, double vision, eye pain or loss of vision ENT ROS: negative for - epistaxis, nasal discharge, oral lesions, sore throat, tinnitus or vertigo Allergy and Immunology ROS: negative for - hives or itchy/watery eyes Hematological and Lymphatic ROS: negative for - bleeding problems, bruising or swollen lymph nodes Endocrine ROS: negative for - galactorrhea, hair pattern changes, polydipsia/polyuria or temperature intolerance Respiratory ROS: negative for - cough, hemoptysis, shortness of breath or wheezing Cardiovascular ROS: negative for - chest pain, dyspnea on exertion, edema or irregular heartbeat Gastrointestinal ROS: negative for - abdominal pain, diarrhea, hematemesis, nausea/vomiting or stool incontinence Genito-Urinary ROS: negative for - dysuria, hematuria, incontinence or urinary frequency/urgency Musculoskeletal ROS: negative for - joint swelling or muscular weakness Neurological ROS: as noted in HPI Dermatological ROS: negative for rash and skin lesion changes  Physical Examination: Blood pressure (!) 132/93, pulse 69, temperature 98.5 F (36.9 C), temperature source Oral, resp. rate 20, height  (1.753 m), weight 82.3 kg (181 lb 8 oz), SpO2 100 %.  HEENT-  Normocephalic, no lesions, without obvious abnormality.  Normal external eye and conjunctiva.  Normal TM's bilaterally.  Normal auditory canals and external ears. Normal external nose, mucus membranes and septum.  Normal pharynx. Cardiovascular- S1,  S2 normal, pulses palpable throughout   Lungs- chest clear, no wheezing, rales, normal symmetric air entry Abdomen- soft, non-tender; bowel sounds normal; no masses,  no organomegaly Extremities- no edema Lymph-no adenopathy palpable Musculoskeletal-no joint tenderness, deformity or swelling Skin-warm and dry, no hyperpigmentation, vitiligo, or  suspicious lesions  Neurological Examination   Mental Status: Alert, oriented, thought content appropriate.  Speech fluent without evidence of aphasia.  Dysarthric.  Able to follow 3 step commands without difficulty. Cranial Nerves: II: Discs flat bilaterally; Visual fields grossly normal, pupils equal, round, reactive to light and accommodation III,IV, VI: ptosis not present, extra-ocular motions intact bilaterally V,VII: decrease in left NLF, facial light touch sensation normal bilaterally VIII: hearing normal bilaterally IX,X: gag reflex present XI: bilateral shoulder shrug XII: midline tongue extension Motor: Right : Upper extremity   5/5 with 5-/5 hand grip Left:     Upper extremity   5-/5  Lower extremity   4-/5     Lower extremity   5/5 Tone and bulk:normal tone throughout; no atrophy noted Sensory: Pinprick and light touch intact throughout, bilaterally Deep Tendon Reflexes: 2+ and symmetric throughout Plantars: Right: upgoing   Left: upgoing Cerebellar: Normal finger-to-nose and normal heel-to-shin testing bilaterally Gait: not tested due to safety concerns    Laboratory Studies:  Basic Metabolic Panel:  Recent Labs Lab 09/02/17 1436 09/03/17 0303  NA 137 139  K 3.9 3.9  CL 105 109  CO2 24 26  GLUCOSE 102* 82  BUN 10 14  CREATININE 0.99 0.98  CALCIUM 9.0 8.6*    Liver Function Tests:  Recent Labs Lab 09/02/17 1436 09/03/17 0303  AST 17 16  ALT 12* 11*  ALKPHOS 50 41  BILITOT 0.5 0.6  PROT 6.7 5.7*  ALBUMIN 3.8 3.2*   No results for input(s): LIPASE, AMYLASE in the last 168 hours. No results for input(s): AMMONIA in the last 168 hours.  CBC:  Recent Labs Lab 09/02/17 1436 09/03/17 0303  WBC 6.0 6.0  NEUTROABS 4.4  --   HGB 15.3 14.7  HCT 44.5 43.9  MCV 83.1 84.1  PLT 378 373    Cardiac Enzymes:  Recent Labs Lab 09/02/17 1436  TROPONINI <0.03    BNP: Invalid input(s): POCBNP  CBG:  Recent Labs Lab 09/02/17 1405  09/03/17 0745  GLUCAP 112* 89    Microbiology: No results found for this or any previous visit.  Coagulation Studies:  Recent Labs  09/02/17 1436  LABPROT 12.9  INR 0.98    Urinalysis: No results for input(s): COLORURINE, LABSPEC, PHURINE, GLUCOSEU, HGBUR, BILIRUBINUR, KETONESUR, PROTEINUR, UROBILINOGEN, NITRITE, LEUKOCYTESUR in the last 168 hours.  Invalid input(s): APPERANCEUR  Lipid Panel:    Component Value Date/Time   CHOL 150 09/21/2014 0408   TRIG 40 09/21/2014 0408   HDL 76 (H) 09/21/2014 0408   VLDL 8 09/21/2014 0408   LDLCALC 66 09/21/2014 0408    HgbA1C: No results found for: HGBA1C  Urine Drug Screen:  No results found for: LABOPIA, COCAINSCRNUR, LABBENZ, AMPHETMU, THCU, LABBARB  Alcohol Level: No results for input(s): ETH in the last 168 hours.   Imaging: [name]  Result Date: 09/02/2017 CLINICAL DATA:  Focal neuro deficit.  Right-sided weakness.  Stroke EXAM: MRI HEAD WITHOUT CONTRAST TECHNIQUE: Multiplanar, multiecho pulse sequences of the brain and surrounding structures were obtained without intravenous contrast. COMPARISON:  CT head at 9:23 2018 FINDINGS: Brain: Small areas of acute infarction in the left posterior cerebral artery territory including the left thalamus, splenium  of the corpus callosum on the left, left cerebral peduncle, and left occipital white matter. Extensive chronic microvascular ischemic change throughout the cerebral white matter bilaterally. Chronic infarcts in the left thalamus and right basal ganglia. Chronic infarct in the right pons. Chronic ischemia right thalamus. Negative for hemorrhage or mass. Ventricle size normal. Vascular: Normal arterial flow void Skull and upper cervical spine: Negative Sinuses/Orbits: Mucosal edema paranasal sinuses.  Normal orbit Other: None IMPRESSION: Acute left posterior cerebral artery infarct. Multiple small areas of infarction are present as described above in the left posterior  cerebral artery territory. Extensive chronic ischemic change. Electronically Signed   By: [name]   On: 09/02/2017 17:58   US Carotid Bilateral  Result Date: 09/02/2017 CLINICAL DATA:  Stroke. EXAM: BILATERAL CAROTID DUPLEX ULTRASOUND TECHNIQUE: Wallace Cullens scale imaging, color Doppler and duplex ultrasound were performed of bilateral carotid and vertebral arteries in the neck. COMPARISON:  03/09/2014 as well as head CT 09/02/2017 and MRI brain 09/02/2017. FINDINGS: Criteria: Quantification of carotid stenosis is based on velocity parameters that correlate the residual internal carotid diameter with NASCET-based stenosis levels, using the diameter of the distal internal carotid lumen as the denominator for stenosis measurement. The following velocity measurements were obtained: RIGHT ICA:  80 cm/sec CCA:  83 cm/sec SYSTOLIC ICA/CCA RATIO:  1.0 DIASTOLIC ICA/CCA RATIO:  1.4 ECA:  69 Cm/sec LEFT ICA:  61 cm/sec CCA:  62 cm/sec SYSTOLIC ICA/CCA RATIO:  1.0 DIASTOLIC ICA/CCA RATIO:  2.2 ECA:  63 Cm/sec RIGHT CAROTID ARTERY: Common carotid artery is within normal with normal waveform. No significant atherosclerotic plaque over the right carotid bulb and internal carotid artery's. Normal Doppler waveforms. RIGHT VERTEBRAL ARTERY:  Normal antegrade flow. LEFT CAROTID ARTERY: Common carotid artery is normal with normal Doppler waveforms. No significant atherosclerotic plaque at the left carotid bulb or internal carotid artery. Normal waveforms throughout the internal carotid artery. LEFT VERTEBRAL ARTERY:  Normal antegrade flow. IMPRESSION: No hemodynamically significant stenosis and no significant atherosclerotic plaque involving the common or internal carotid arteries bilaterally. Normal antegrade flow within the vertebral arteries. Electronically Signed   By: [name]   On: 09/02/2017 19:04   Ct Head Code Stroke Wo Contrast  Result Date: 09/02/2017 CLINICAL DATA:  Code stroke.  Slurred speech,  right-sided weakness EXAM: CT HEAD WITHOUT CONTRAST TECHNIQUE: Contiguous axial images were obtained from the base of the skull through the vertex without intravenous contrast. COMPARISON:  None. FINDINGS: Brain: Ventricle size normal.  Mild atrophy. Extensive chronic microvascular ischemic change throughout the cerebral white matter. Chronic infarcts in the internal capsule on the right and thalamus on the left. Chronic infarct left parietal lobe. Chronic infarct in the right pons. Negative for acute infarct, hemorrhage, or mass lesion. Vascular: Negative for hyperdense vessel. Atherosclerotic calcification. Skull: Negative Sinuses/Orbits: Negative Other: None ASPECTS (Alberta Stroke Program Early CT Score) - Ganglionic level infarction (caudate, lentiform nuclei, internal capsule, insula, M1-M3 cortex): 7 - Supraganglionic infarction (M4-M6 cortex): 3 Total score (0-10 with 10 being normal): 10 IMPRESSION: 1. No acute abnormality.  Extensive chronic ischemia 2. ASPECTS is 10 These results were called by telephone at the time of interpretation on 09/02/2017 at 2:11 pm to Dr. [name] , who verbally acknowledged these results. Electronically Signed   By: [name]   On: 09/02/2017 14:11    Assessment: 56 y.o. male presenting with slurred speech and right sided weakness.  MR of the brain reviewed and shows multiple small PCA territory acute infarcts.  Likely embolic in etiology.  Patient on no antiplatelet therapy.  Carotid dopplers show no evidence of hemodynamically significant stenosis.  Echocardiogram shows no cardiac source of emboli with an EF of 60-65%.   Stroke Risk Factors - hypertension and smoking  Plan: 1. HgbA1c 2. PT consult, OT consult, Speech consult 3. Prophylactic therapy-Antiplatelet med: Plavix - dose  daily 4. Telemetry monitoring 5. Frequent neuro checks 6. Smoking cessation counseling   Thana Farr, MD Neurology [phone] 09/03/2017, 10:35 PM

## 2017-09-03 NOTE — Care Management (Signed)
Placed in observation for sx concerning for acute CVA.  MRI is positive.  Previous history of cva.  Presents from home with upper extremity weakness and slurred speech.  Current with his pcp.  No issues accessing medical care or obtaining medications.  PT and SLP consults pending.

## 2017-09-03 NOTE — Progress Notes (Signed)
*  PRELIMINARY RESULTS* Echocardiogram 2D Echocardiogram has been performed.  Cristela Blue 09/03/2017, 1:33 PM

## 2017-09-03 NOTE — Evaluation (Signed)
Clinical/Bedside Swallow Evaluation Patient Details  Name: Cory Harvey MRN: [id number] Date of Birth: [birth year]  Today's Date: 09/03/2017 Time: SLP Start Time (ACUTE ONLY): 1150 SLP Stop Time (ACUTE ONLY): 1240 SLP Time Calculation (min) (ACUTE ONLY): 50 min  Past Medical History:  Past Medical History:  Diagnosis Date  . Depression   . History of CVA (cerebrovascular accident)   . Hypertension   . Stroke (HCC)    x 4  . Tobacco abuse    Past Surgical History:  Past Surgical History:  Procedure Laterality Date  . kidney surgery Left 1983   HPI:  Pt  is a 56 y.o. male w/ PMH of CVAs, HTN, tobacco use,  and depression who presents to the emergency department today because of concerns for slurred speech and right-sided numbness and weakness. Patient states that this is been going on for about the past week. He states that he came in today because he thought it would have improved by now. The patient states that his weakness is in his right upper and lower extremity. It is not so severe that he can't use his extremities. He states he is still able to walk. In addition he has noticed some slurred speech. He does have a history of strokes and states that he has had this in the past. He states he is currently not on any blood thinning medications or aspirin. Pt endorsed s/s of min dyslfuency of speech (and also appears to have a slight lisp?) but stated when he "slows down" when talking the "stuttering doesn't happen". Discussed this strategy w/ pt further and encouraged him to use strategies of slowing down and over-articulating when conversing w/ others to reduce any articulation/fluency issues - pt is able to use these strategies independently and w/ accuracy. Pt denies any swallowing difficulty at this time.    Assessment / Plan / Recommendation Clinical Impression  Pt appears to present w/ adequate oropharyngeal phase swallow function w/ no immediate, overt s/s of aspiration noted  during po trials given at this exam. Pt consumed trials of thin liquids and softened solids w/ no overt coughing or throat clearing noted; vocal quality clear b/t trials and no decline in respiratory status occurred. Oral phase was c/b adequate bolus control and timely mastication/manipulation and A-P transfer for swallowing; oral clearing followed appropriately. No oral motor weakness noted. Pt was able to feed self w/out setup assistance needed. Recommend continue regular diet as ordered by MD; Thin liquids. Recommend general aspiration precautions. No further skilled ST services indicated at this time; NSG to reconsult if any change in status while admitted. Pt agreed. NSG updated. Pt will f/u w/ outpt services for any concerns re: dysfluency or articuation of speech post discharge per primary MD referral. Pt converses at conversational level w/ only intermittent dysfluency of speech noted - pt is aware of it and is able to use strategies discussed/instructed on independently.  SLP Visit Diagnosis: Dysphagia, unspecified (R13.10)    Aspiration Risk   (reduced)    Diet Recommendation  regular diet w/ thin liquids; general aspiration precautions  Medication Administration: Whole meds with liquid    Other  Recommendations Recommended Consults:  (n/a) Oral Care Recommendations: Oral care BID;Patient independent with oral care   Follow up Recommendations None      Frequency and Duration  (n/a)   (n/a)       Prognosis Prognosis for Safe Diet Advancement: Good Barriers to Reach Goals:  (none)      Swallow Study  General Date of Onset: 09/02/17 HPI: Pt  is a 56 y.o. male w/ PMH of CVAs, HTN, tobacco use,  and depression who presents to the emergency department today because of concerns for slurred speech and right-sided numbness and weakness. Patient states that this is been going on for about the past week. He states that he came in today because he thought it would have improved by now. The  patient states that his weakness is in his right upper and lower extremity. It is not so severe that he can't use his extremities. He states he is still able to walk. In addition he has noticed some slurred speech. He does have a history of strokes and states that he has had this in the past. He states he is currently not on any blood thinning medications or aspirin. Pt endorsed s/s of min dyslfuency of speech (and also appears to have a slight lisp?) but stated when he "slows down" when talking the "stuttering doesn't happen". Discussed this strategy w/ pt further and encouraged him to use strategies of slowing down and over-articulating when conversing w/ others to reduce any articulation/fluency issues - pt is able to use these strategies independently and w/ accuracy. Pt denies any swallowing difficulty at this time.  Type of Study: Bedside Swallow Evaluation Previous Swallow Assessment: none reported Diet Prior to this Study: Regular;Thin liquids Temperature Spikes Noted: No (wbc not elevated) Respiratory Status: Room air History of Recent Intubation: No Behavior/Cognition: Alert;Cooperative;Pleasant mood Oral Cavity Assessment: Within Functional Limits Oral Care Completed by SLP: Recent completion by staff Oral Cavity - Dentition: Dentures, top;Dentures, bottom (in place) Vision: Functional for self-feeding Self-Feeding Abilities: Able to feed self Patient Positioning: Upright in bed Baseline Vocal Quality: Normal;Low vocal intensity (min) Volitional Cough: Strong Volitional Swallow: Able to elicit    Oral/Motor/Sensory Function Overall Oral Motor/Sensory Function: Within functional limits   Ice Chips Ice chips: Not tested Other Comments: eating meal   Thin Liquid Thin Liquid: Within functional limits Presentation: Cup;Self Fed (7-8 trials)    Nectar Thick Nectar Thick Liquid: Not tested   Honey Thick Honey Thick Liquid: Not tested   Puree Puree: Not tested   Solid   GO   Solid:  Within functional limits Presentation: Self Fed;Spoon (7-8 trials of meats, french fries)    Functional Assessment Tool Used: clinical judgement Functional Limitations: Swallowing Swallow Current Status (Z6109): 0 percent impaired, limited or restricted Swallow Goal Status (U0454): 0 percent impaired, limited or restricted Swallow Discharge Status (U9811): 0 percent impaired, limited or restricted    Jerilynn Som, MS, CCC-SLP Watson,Katherine 09/03/2017,2:13 PM

## 2017-09-03 NOTE — Progress Notes (Signed)
OT Cancellation Note  Patient Details Name: Cory Harvey MRN: [id number] DOB: 1961/10/17   Cancelled Treatment:    Reason Eval/Treat Not Completed: Patient at procedure or test/ unavailable. Order received, chart reviewed. Pt undergoing echo. Will re-attempt this afternoon as pt is available.  Richrd Prime, MPH, MS, OTR/L ascom [phone] 09/03/17, 12:24 PM

## 2017-09-03 NOTE — Evaluation (Signed)
Occupational Therapy Evaluation Patient Details Name: Cory Harvey MRN: [id number] Baylep 03, 1962 Today's Date: 09/03/2017    History of Present Illness Pt is a 56 y/o M who presented with R sided weakness and slurred speech.  MRI showed acute CVA.  Pt's PMH includes stroke x4, tobacco abuse, cancer.    Clinical Impression   Pt seen for OT evaluation. Prior to admission, pt independent with all aspects of mobility and ADL, and pt eager to return home at [facility]. Pt currently presents with minor R grip weakness (4/5 versus 5/5) and tingling in RUE, numbness/tingling in R foot with RLE weakness, and very mildly impaired balance, requiring supervision for mobility during assessment, with no LOB noted, otherwise modified independent for all ADL. Pt noted dizziness earlier today with mobility. Pt alert and oriented, following all commands, demonstrating good safety awareness throughout. No skilled OT services indicated at this time. Will sign off. Please re-consult if additional needs arise.     Follow Up Recommendations  No OT follow up    Equipment Recommendations  None recommended by OT    Recommendations for Other Services       Precautions / Restrictions Precautions Precautions: Fall Restrictions Weight Bearing Restrictions: No      Mobility Bed Mobility Overal bed mobility: Independent             General bed mobility comments: No physical assist or cues needed.  Pt performs independently.   Transfers Overall transfer level: Needs assistance Equipment used: None Transfers: Sit to/from Stand Sit to Stand: Modified independent (Device/Increase time)         General transfer comment: Pt performs slowly and cautiously but no physical assist or cues needed.    Balance Overall balance assessment: Needs assistance Sitting-balance support: No upper extremity supported;Feet supported Sitting balance-Leahy Scale: Good     Standing balance support: No upper extremity  supported;During functional activity Standing balance-Leahy Scale: Good                             ADL either performed or assessed with clinical judgement   ADL Overall ADL's : Modified independent                                       General ADL Comments: pt able to perform all aspects of dressing, toileting, and grooming with modified independence, pt careful with mobility during self care tasks, took his time to ensure safety     Vision Baseline Vision/History: Wears glasses Wears Glasses: At all times Patient Visual Report: No change from baseline Vision Assessment?: No apparent visual deficits     Perception     Praxis      Pertinent Vitals/Pain Pain Assessment: No/denies pain     Hand Dominance Left   Extremity/Trunk Assessment Upper Extremity Assessment Upper Extremity Assessment: RUE deficits/detail RUE Deficits / Details: shoulder, elbow grossly 5/5, grip 4/5, tingling in RUE noted, normal RAM, finger to nose testing   Lower Extremity Assessment Lower Extremity Assessment: Defer to PT evaluation;RLE deficits/detail RLE Deficits / Details: Strength grossly 4/5, pt reports numbness/tingling in R foot   Cervical / Trunk Assessment Cervical / Trunk Assessment: Normal   Communication Communication Communication: Expressive difficulties   Cognition Arousal/Alertness: Awake/alert Behavior During Therapy: WFL for tasks assessed/performed Overall Cognitive Status: Within Functional Limits for tasks assessed  General Comments       Exercises     Shoulder Instructions      Home Living Family/patient expects to be discharged to:: Private residence Living Arrangements: Alone Available Help at Discharge: Family;Available PRN/intermittently (brother works during the day) Type of Home: House Home Access: Stairs to enter Secretary/administrator of Steps: 6 Entrance Stairs-Rails:  Left;Right;Can reach both Home Layout: One level     Bathroom Shower/Tub: IT trainer: Standard     Home Equipment: None          Prior Functioning/Environment Level of Independence: Independent        Comments: Pt independent with all aspects of mobility. He does not use AD at baseline.  He is still driving.  No falls in the past 6 months.  Pt assists his mother with cooking and cleaning.         OT Problem List:        OT Treatment/Interventions:      OT Goals(Current goals can be found in the care plan section) Acute Rehab OT Goals OT Goal Formulation: All assessment and education complete, DC therapy  OT Frequency:     Barriers to D/C:            Co-evaluation              AM-PAC PT "6 Clicks" Daily Activity     Outcome Measure Help from another person eating meals?: None Help from another person taking care of personal grooming?: None Help from another person toileting, which includes using toliet, bedpan, or urinal?: None Help from another person bathing (including washing, rinsing, drying)?: None Help from another person to put on and taking off regular upper body clothing?: None Help from another person to put on and taking off regular lower body clothing?: None 6 Click Score: 24   End of Session    Activity Tolerance: Patient tolerated treatment well Patient left: in bed;with call bell/phone within reach  OT Visit Diagnosis: Other abnormalities of gait and mobility (R26.89)                Time: 9604-5409 OT Time Calculation (min): 17 min Charges:  OT General Charges $OT Visit: 1 Visit OT Evaluation $OT Eval Low Complexity: 1 Low G-Codes: OT G-codes **NOT FOR INPATIENT CLASS** Functional Assessment Tool Used: AM-PAC 6 Clicks Daily Activity;Clinical judgement Functional Limitation: Self care Self Care Current Status (W1191): 0 percent impaired, limited or restricted Self Care Goal Status (Y7829): 0 percent  impaired, limited or restricted Self Care Discharge Status (F6213): 0 percent impaired, limited or restricted   Richrd Prime, MPH, MS, OTR/L ascom [phone] 09/03/17, 3:32 PM

## 2017-09-04 LAB — LIPID PANEL
CHOL/HDL RATIO: 2.8 ratio
Cholesterol: 164 mg/dL (ref 0–200)
HDL: 58 mg/dL (ref >40)
LDL CALC: 88 mg/dL (ref 0–99)
Triglycerides: 91 mg/dL (ref <150)
VLDL: 18 mg/dL (ref 0–40)

## 2017-09-04 LAB — GLUCOSE, CAPILLARY: Glucose-Capillary: 103 mg/dL — ABNORMAL HIGH (ref 65–99)

## 2017-09-04 LAB — HEMOGLOBIN A1C
Hgb A1c MFr Bld: 5.4 % (ref 4.8–5.6)
MEAN PLASMA GLUCOSE: 108.28 mg/dL

## 2017-09-04 MED ORDER — ASPIRIN EC 81 MG PO TBEC: 81.0000 mg | DELAYED_RELEASE_TABLET | Freq: Every day | ORAL | 2 refills | Status: DC

## 2017-09-04 MED ORDER — CLOPIDOGREL BISULFATE 75 MG PO TABS: 75.0000 mg | ORAL_TABLET | Freq: Every day | ORAL | 0 refills | Status: DC

## 2017-09-04 MED ORDER — ATORVASTATIN CALCIUM 10 MG PO TABS: 20.0000 mg | ORAL_TABLET | Freq: Every day | ORAL | 0 refills | Status: DC

## 2017-09-04 MED ORDER — LOSARTAN POTASSIUM 50 MG PO TABS: 50.0000 mg | ORAL_TABLET | Freq: Every day | ORAL | 0 refills | Status: DC

## 2017-09-04 NOTE — Discharge Instructions (Signed)
[facility] - [place] at [facility] ° °DIET:  °Cardiac diet ° °DISCHARGE CONDITION:  °Stable ° °ACTIVITY:  °Activity as tolerated ° °OXYGEN:  °Home Oxygen: No. °  °Oxygen Delivery: room air ° °DISCHARGE LOCATION:  °home  ° ° °ADDITIONAL DISCHARGE INSTRUCTION: ° ° °If you experience worsening of your admission symptoms, develop shortness of breath, life threatening emergency, suicidal or homicidal thoughts you must seek medical attention immediately by calling 911 or calling your MD immediately  if symptoms less severe. ° °You Must read complete instructions/literature along with all the possible adverse reactions/side effects for all the Medicines you take and that have been prescribed to you. Take any new Medicines after you have completely understood and accpet all the possible adverse reactions/side effects.  ° °Please note ° °You were cared for by a hospitalist during your hospital stay. If you have any questions about your discharge medications or the care you received while you were in the hospital after you are discharged, you can call the unit and asked to speak with the hospitalist on call if the hospitalist that took care of you is not available. Once you are discharged, your primary care physician will handle any further medical issues. Please note that NO REFILLS for any discharge medications will be authorized once you are discharged, as it is imperative that you return to your primary care physician (or establish a relationship with a primary care physician if you do not have one) for your aftercare needs so that they can reassess your need for medications and monitor your lab values. ° ° °

## 2017-09-04 NOTE — Discharge Summary (Signed)
[facility] - [place] at [facility], 56 y.o., DOB [birth year], MRN [id number]. Admission date: 09/02/2017 Discharge Date 09/04/2017 Primary MD Marisue Ivan, MD Admitting Physician Marguarite Arbour, MD  Admission Diagnosis  Numbness [R20.0] Stroke [facility]) [I63.9] History of CVA (cerebrovascular accident) [Z86.73] Right sided weakness [R53.1]  Discharge Diagnosis   Principal Problem:   Acute CVA (cerebrovascular accident) [facility])    essential  Hypertension    Nicotine abuse    Depression   Previous CVA       Hospital Course  Patient is a 56 year old African-American male with history of multiple strokes who presented with complaint of slurred speech and right-sided weakness. Patient had not been taking Plavix because he stated that he was discontinued for some reason. He was thought to have aspirin failure that is why he was changed to Plavix. Patient was admitted to the hospital for further evaluation. Patient was seen by neurology and restarted on Plavix. Also had Carotid Dopplers which did not show significant carotid artery stenosis. Patient continues to smoke he was recommended to stop smoking. Patient was seen by physical therapy recommended outpatient therapy. Which will need to be arranged by primary care provider. Occupational therapy who recommended no fatigue therapy.              Consults  neurology  Significant Tests:  See full reports for all details     [name]  Result Date: 09/02/2017 CLINICAL DATA:  Focal neuro deficit.  Right-sided weakness.  Stroke EXAM: MRI HEAD WITHOUT CONTRAST TECHNIQUE: Multiplanar, multiecho pulse sequences of the brain and surrounding structures were obtained without intravenous contrast. COMPARISON:  CT head at 9:23 2018 FINDINGS: Brain: Small areas of acute infarction in the left posterior cerebral artery territory including the left thalamus, splenium of the corpus callosum on the  left, left cerebral peduncle, and left occipital white matter. Extensive chronic microvascular ischemic change throughout the cerebral white matter bilaterally. Chronic infarcts in the left thalamus and right basal ganglia. Chronic infarct in the right pons. Chronic ischemia right thalamus. Negative for hemorrhage or mass. Ventricle size normal. Vascular: Normal arterial flow void Skull and upper cervical spine: Negative Sinuses/Orbits: Mucosal edema paranasal sinuses.  Normal orbit Other: None IMPRESSION: Acute left posterior cerebral artery infarct. Multiple small areas of infarction are present as described above in the left posterior cerebral artery territory. Extensive chronic ischemic change. Electronically Signed   By: [name]   On: 09/02/2017 17:58   US Carotid Bilateral  Result Date: 09/02/2017 CLINICAL DATA:  Stroke. EXAM: BILATERAL CAROTID DUPLEX ULTRASOUND TECHNIQUE: Wallace Cullens scale imaging, color Doppler and duplex ultrasound were performed of bilateral carotid and vertebral arteries in the neck. COMPARISON:  03/09/2014 as well as head CT 09/02/2017 and MRI brain 09/02/2017. FINDINGS: Criteria: Quantification of carotid stenosis is based on velocity parameters that correlate the residual internal carotid diameter with NASCET-based stenosis levels, using the diameter of the distal internal carotid lumen as the denominator for stenosis measurement. The following velocity measurements were obtained: RIGHT ICA:  80 cm/sec CCA:  83 cm/sec SYSTOLIC ICA/CCA RATIO:  1.0 DIASTOLIC ICA/CCA RATIO:  1.4 ECA:  69 Cm/sec LEFT ICA:  61 cm/sec CCA:  62 cm/sec SYSTOLIC ICA/CCA RATIO:  1.0 DIASTOLIC ICA/CCA RATIO:  2.2 ECA:  63 Cm/sec RIGHT CAROTID ARTERY: Common carotid artery is within normal with normal waveform. No significant atherosclerotic plaque over the right carotid bulb and internal carotid artery's. Normal Doppler waveforms. RIGHT VERTEBRAL ARTERY:  Normal antegrade flow. LEFT CAROTID ARTERY:  Common carotid artery is normal with normal Doppler waveforms. No significant atherosclerotic plaque at the left carotid bulb or internal carotid artery. Normal waveforms throughout the internal carotid artery. LEFT VERTEBRAL ARTERY:  Normal antegrade flow. IMPRESSION: No hemodynamically significant stenosis and no significant atherosclerotic plaque involving the common or internal carotid arteries bilaterally. Normal antegrade flow within the vertebral arteries. Electronically Signed   By: [name]   On: 09/02/2017 19:04   Ct Head Code Stroke Wo Contrast  Result Date: 09/02/2017 CLINICAL DATA:  Code stroke.  Slurred speech, right-sided weakness EXAM: CT HEAD WITHOUT CONTRAST TECHNIQUE: Contiguous axial images were obtained from the base of the skull through the vertex without intravenous contrast. COMPARISON:  None. FINDINGS: Brain: Ventricle size normal.  Mild atrophy. Extensive chronic microvascular ischemic change throughout the cerebral white matter. Chronic infarcts in the internal capsule on the right and thalamus on the left. Chronic infarct left parietal lobe. Chronic infarct in the right pons. Negative for acute infarct, hemorrhage, or mass lesion. Vascular: Negative for hyperdense vessel. Atherosclerotic calcification. Skull: Negative Sinuses/Orbits: Negative Other: None ASPECTS (Alberta Stroke Program Early CT Score) - Ganglionic level infarction (caudate, lentiform nuclei, internal capsule, insula, M1-M3 cortex): 7 - Supraganglionic infarction (M4-M6 cortex): 3 Total score (0-10 with 10 being normal): 10 IMPRESSION: 1. No acute abnormality.  Extensive chronic ischemia 2. ASPECTS is 10 These results were called by telephone at the time of interpretation on 09/02/2017 at 2:11 pm to Dr. [name] , who verbally acknowledged these results. Electronically Signed   By: [name]   On: 09/02/2017 14:11       Today   Subjective:   Cory Harvey  patient feeling better weakness  improved  Objective:   Blood pressure (!) 156/81, pulse (!) 52, temperature 97.7 F (36.5 C), temperature source Oral, resp. rate 18, height  (1.753 m), weight 185 lb 4.8 oz (84.1 kg), SpO2 100 %.  .  Intake/Output Summary (Last 24 hours) at 09/04/17 1041 Last data filed at 09/04/17 0959  Gross per 24 hour  Intake          2078.75 ml  Output                0 ml  Net          2078.75 ml    Exam VITAL SIGNS: Blood pressure (!) 156/81, pulse (!) 52, temperature 97.7 F (36.5 C), temperature source Oral, resp. rate 18, height  (1.753 m), weight 185 lb 4.8 oz (84.1 kg), SpO2 100 %.  GENERAL:  56 y.o.-year-old patient lying in the bed with no acute distress.  EYES: Pupils equal, round, reactive to light and accommodation. No scleral icterus. Extraocular muscles intact.  HEENT: Head atraumatic, normocephalic. Oropharynx and nasopharynx clear.  NECK:  Supple, no jugular venous distention. No thyroid enlargement, no tenderness.  LUNGS: Normal breath sounds bilaterally, no wheezing, rales,rhonchi or crepitation. No use of accessory muscles of respiration.  CARDIOVASCULAR: S1, S2 normal. No murmurs, rubs, or gallops.  ABDOMEN: Soft, nontender, nondistended. Bowel sounds present. No organomegaly or mass.  EXTREMITIES: No pedal edema, cyanosis, or clubbing.  NEUROLOGIC: Cranial nerves II through XII are intact. Right lower extremities 4 out of 5.  rest of the extremity strength normal Sensation intact. Gait not checked.  PSYCHIATRIC: The patient is alert and oriented x 3.  SKIN: No obvious rash, lesion, or ulcer.   Data Review     CBC  w Diff: Lab Results  Component Value Date   WBC 6.0 09/03/2017   HGB 14.7 09/03/2017   HGB 14.8 09/21/2014   HCT 43.9 09/03/2017   HCT 45.9 09/21/2014   PLT 373 09/03/2017   PLT 306 09/21/2014   LYMPHOPCT 15 09/02/2017   LYMPHOPCT 1.6 09/21/2014   MONOPCT 7 09/02/2017   MONOPCT 2.1 09/21/2014   EOSPCT 4 09/02/2017   EOSPCT 0.0 09/21/2014    BASOPCT 1 09/02/2017   BASOPCT 0.3 09/21/2014   CMP: Lab Results  Component Value Date   NA 139 09/03/2017   NA 143 09/21/2014   K 3.9 09/03/2017   K 3.4 (L) 09/21/2014   CL 109 09/03/2017   CL 110 (H) 09/21/2014   CO2 26 09/03/2017   CO2 24 09/21/2014   BUN 14 09/03/2017   BUN 11 09/21/2014   CREATININE 0.98 09/03/2017   CREATININE 1.22 09/21/2014   PROT 5.7 (L) 09/03/2017   PROT 7.2 09/20/2014   ALBUMIN 3.2 (L) 09/03/2017   ALBUMIN 3.7 09/20/2014   BILITOT 0.6 09/03/2017   BILITOT 0.4 09/20/2014   ALKPHOS 41 09/03/2017   ALKPHOS 76 09/20/2014   AST 16 09/03/2017   AST 25 09/20/2014   ALT 11 (L) 09/03/2017   ALT 22 09/20/2014  .  Micro Results No results found for this or any previous visit (from the past 240 hour(s)).      Code Status Orders        Start     Ordered   09/02/17 1937  Full code  Continuous     09/02/17 1936    Code Status History    Date Active Date Inactive Code Status Order ID Comments User Context   This patient has a current code status but no historical code status.          Follow-up Information    Go to Marisue Ivan, MD.   Specialty:  Family Medicine Why:  Office will call patient with appointment Contact information: [address] [facility] [place] [address] [phone]           Discharge Medications   Allergies as of 09/04/2017   No Known Allergies     Medication List    TAKE these medications   aspirin EC 81 MG tablet Take 1 tablet (81 mg total) by mouth daily.   atorvastatin 10 MG tablet Commonly known as:  LIPITOR Take 2 tablets (20 mg total) by mouth daily at 6 PM.   clopidogrel 75 MG tablet Commonly known as:  PLAVIX Take 1 tablet (75 mg total) by mouth daily.   losartan 50 MG tablet Commonly known as:  COZAAR Take 1 tablet (50 mg total) by mouth daily.            Discharge Care Instructions        Start     Ordered   09/05/17 0000  losartan (COZAAR) 50  MG tablet  Daily     09/04/17 0902   09/05/17 0000  clopidogrel (PLAVIX) 75 MG tablet  Daily     09/04/17 0902   09/04/17 0000  atorvastatin (LIPITOR) 10 MG tablet  Daily-1800     09/04/17 0902   09/04/17 0000  aspirin EC 81 MG tablet  Daily     09/04/17 0902         Total Time in preparing paper work, data evaluation and todays exam - 35 minutes  Auburn Bilberry M.D on 09/04/2017 at 10:41 AM  [facility] Sprang  [facility]   Office  [phone]

## 2017-09-04 NOTE — Progress Notes (Signed)
Upon arriving to bedside to complete discharge, patient was not present. Attempted to call patient - no answer - left message. Will attempt to reach patient again. Discharge paperwork and prescriptions placed in chart at this time. Bo Mcclintock, RN

## 2018-03-26 ENCOUNTER — Emergency Department: Payer: Medicare Other

## 2018-03-26 ENCOUNTER — Inpatient Hospital Stay
Admit: 2018-03-26 | Discharge: 2018-03-26 | Disposition: A | Discharge status: Home / Self Care | Payer: Medicare Other | Attending: Internal Medicine | Admitting: Internal Medicine

## 2018-03-26 ENCOUNTER — Inpatient Hospital Stay
Admission: EM | Admit: 2018-03-26 | Discharge: 2018-03-29 | DRG: 065 | Disposition: A | Discharge status: Rehabilitation Facility | Payer: Medicare Other | Attending: Internal Medicine | Admitting: Internal Medicine

## 2018-03-26 ENCOUNTER — Encounter: Payer: Self-pay | Admitting: Emergency Medicine

## 2018-03-26 ENCOUNTER — Other Ambulatory Visit: Payer: Self-pay

## 2018-03-26 DIAGNOSIS — Z7982 Long term (current) use of aspirin: Secondary | ICD-10-CM | POA: Diagnosis not present

## 2018-03-26 DIAGNOSIS — I10 Essential (primary) hypertension: Secondary | ICD-10-CM | POA: Diagnosis present

## 2018-03-26 DIAGNOSIS — R29702 NIHSS score 2: Secondary | ICD-10-CM | POA: Diagnosis present

## 2018-03-26 DIAGNOSIS — M25551 Pain in right hip: Secondary | ICD-10-CM | POA: Diagnosis present

## 2018-03-26 DIAGNOSIS — R471 Dysarthria and anarthria: Secondary | ICD-10-CM | POA: Diagnosis present

## 2018-03-26 DIAGNOSIS — G8191 Hemiplegia, unspecified affecting right dominant side: Secondary | ICD-10-CM | POA: Diagnosis present

## 2018-03-26 DIAGNOSIS — I639 Cerebral infarction, unspecified: Secondary | ICD-10-CM | POA: Diagnosis not present

## 2018-03-26 DIAGNOSIS — F17210 Nicotine dependence, cigarettes, uncomplicated: Secondary | ICD-10-CM | POA: Diagnosis present

## 2018-03-26 DIAGNOSIS — E785 Hyperlipidemia, unspecified: Secondary | ICD-10-CM | POA: Diagnosis present

## 2018-03-26 DIAGNOSIS — Z7902 Long term (current) use of antithrombotics/antiplatelets: Secondary | ICD-10-CM

## 2018-03-26 DIAGNOSIS — R4781 Slurred speech: Secondary | ICD-10-CM | POA: Diagnosis present

## 2018-03-26 DIAGNOSIS — I63512 Cerebral infarction due to unspecified occlusion or stenosis of left middle cerebral artery: Principal | ICD-10-CM | POA: Diagnosis present

## 2018-03-26 DIAGNOSIS — Z79899 Other long term (current) drug therapy: Secondary | ICD-10-CM | POA: Diagnosis not present

## 2018-03-26 DIAGNOSIS — R4701 Aphasia: Secondary | ICD-10-CM | POA: Diagnosis present

## 2018-03-26 LAB — CBC
HEMATOCRIT: 50.3 % (ref 40.0–52.0)
HEMOGLOBIN: 16.7 g/dL (ref 13.0–18.0)
MCH: 28.2 pg (ref 26.0–34.0)
MCHC: 33.2 g/dL (ref 32.0–36.0)
MCV: 84.8 fL (ref 80.0–100.0)
Platelets: 440 10*3/uL (ref 150–440)
RBC: 5.93 MIL/uL — ABNORMAL HIGH (ref 4.40–5.90)
RDW: 15 % — ABNORMAL HIGH (ref 11.5–14.5)
WBC: 6.8 10*3/uL (ref 3.8–10.6)

## 2018-03-26 LAB — BASIC METABOLIC PANEL
ANION GAP: 9 (ref 5–15)
BUN: 16 mg/dL (ref 6–20)
CHLORIDE: 103 mmol/L (ref 101–111)
CO2: 25 mmol/L (ref 22–32)
Calcium: 9.2 mg/dL (ref 8.9–10.3)
Creatinine, Ser: 1.24 mg/dL (ref 0.61–1.24)
GFR calc non Af Amer: >60 mL/min (ref >60)
Glucose, Bld: 91 mg/dL (ref 65–99)
Potassium: 3.8 mmol/L (ref 3.5–5.1)
Sodium: 137 mmol/L (ref 135–145)

## 2018-03-26 LAB — URINALYSIS, COMPLETE (UACMP) WITH MICROSCOPIC
Bacteria, UA: NONE SEEN
Bilirubin Urine: NEGATIVE
Glucose, UA: NEGATIVE mg/dL
Hgb urine dipstick: NEGATIVE
KETONES UR: 20 mg/dL — AB
Leukocytes, UA: NEGATIVE
Nitrite: NEGATIVE
PROTEIN: NEGATIVE mg/dL
Specific Gravity, Urine: 1.025 (ref 1.005–1.030)
pH: 5 (ref 5.0–8.0)

## 2018-03-26 MED ORDER — ENOXAPARIN SODIUM 40 MG/0.4ML ~~LOC~~ SOLN
40.0000 mg | SUBCUTANEOUS | Status: DC
  Administered 2018-03-26 – 2018-03-28 (×3): 40 mg via SUBCUTANEOUS
  Filled 2018-03-26 (×3): qty 0.4

## 2018-03-26 MED ORDER — ASPIRIN 300 MG RE SUPP
300.0000 mg | Freq: Every day | RECTAL | Status: DC
  Filled 2018-03-26 (×2): qty 1

## 2018-03-26 MED ORDER — CLOPIDOGREL BISULFATE 75 MG PO TABS
75.0000 mg | ORAL_TABLET | Freq: Every day | ORAL | Status: DC
  Administered 2018-03-27 – 2018-03-29 (×3): 75 mg via ORAL
  Filled 2018-03-26 (×3): qty 1

## 2018-03-26 MED ORDER — ACETAMINOPHEN 650 MG RE SUPP: 650.0000 mg | RECTAL | Status: DC | PRN

## 2018-03-26 MED ORDER — ACETAMINOPHEN 160 MG/5ML PO SOLN
650.0000 mg | ORAL | Status: DC | PRN
  Filled 2018-03-26: qty 20.3

## 2018-03-26 MED ORDER — STROKE: EARLY STAGES OF RECOVERY BOOK
Freq: Once | Status: AC
  Administered 2018-03-26: 22:00:00

## 2018-03-26 MED ORDER — ASPIRIN 81 MG PO CHEW
324.0000 mg | CHEWABLE_TABLET | Freq: Once | ORAL | Status: AC
  Administered 2018-03-26: 324 mg via ORAL
  Filled 2018-03-26: qty 4

## 2018-03-26 MED ORDER — SODIUM CHLORIDE 0.9 % IV SOLN
INTRAVENOUS | Status: DC
  Administered 2018-03-26: 17:00:00 via INTRAVENOUS

## 2018-03-26 MED ORDER — LOSARTAN POTASSIUM 50 MG PO TABS
50.0000 mg | ORAL_TABLET | Freq: Every day | ORAL | Status: DC
  Administered 2018-03-27 – 2018-03-29 (×3): 50 mg via ORAL
  Filled 2018-03-26 (×3): qty 1

## 2018-03-26 MED ORDER — ASPIRIN EC 325 MG PO TBEC
325.0000 mg | DELAYED_RELEASE_TABLET | Freq: Every day | ORAL | Status: DC
  Administered 2018-03-26 – 2018-03-29 (×4): 325 mg via ORAL
  Filled 2018-03-26 (×4): qty 1

## 2018-03-26 MED ORDER — ACETAMINOPHEN 325 MG PO TABS: 650.0000 mg | ORAL_TABLET | ORAL | Status: DC | PRN

## 2018-03-26 NOTE — ED Provider Notes (Signed)
[facility] Emergency Department Provider Note    First MD Initiated Contact with Patient 03/26/18 1153     (approximate)  I have reviewed the triage vital signs and the nursing notes.   HISTORY  Chief Complaint Hip Pain; Aphasia; and Hand Problem    HPI Cory Harvey is a 57 y.o. male history of CVA as well as depression and tobacco abuse presents with chief complaint of right sided numbness as well as intermittent slurred speech since Friday.  Patient states it feels like he is having a stroke as he typically does not have those symptoms.  Denies any chest pain.  Does have right hip pain.  Denies any falls but states he has been favoring his right leg.  No blurry vision.  Patient states that he is hungry and would like for me to get him a whopper from [employer].  Past Medical History:  Diagnosis Date  . Depression   . History of CVA (cerebrovascular accident)   . Hypertension   . Stroke (HCC)    x 4  . Tobacco abuse    Family History  Problem Relation Age of Onset  . Diabetes Mother   . Heart disease Mother   . Hyperlipidemia Mother   . Hypertension Mother   . Stroke Mother   . Diabetes Father   . Hypertension Father   . Cancer Neg Hx    Past Surgical History:  Procedure Laterality Date  . kidney surgery Left 1983   Patient Active Problem List   Diagnosis Date Noted  . Acute CVA (cerebrovascular accident) (HCC) 09/02/2017  . Gait disturbance 09/02/2017  . History of CVA (cerebrovascular accident) 04/18/2017  . Tobacco use 04/18/2017  . Depression   . Hypertension   . Stroke [facility])       Prior to Admission medications   Medication Sig Start Date End Date Taking? Authorizing Provider  aspirin EC 81 MG tablet Take 1 tablet (81 mg total) by mouth daily. 09/04/17 11/28/18 Yes Auburn Bilberry, MD  atorvastatin (LIPITOR) 10 MG tablet Take 2 tablets (20 mg total) by mouth daily at 6 PM. 09/04/17  Yes Auburn Bilberry, MD  clopidogrel  (PLAVIX) 75 MG tablet Take 1 tablet (75 mg total) by mouth daily. 09/05/17  Yes Auburn Bilberry, MD  losartan (COZAAR) 50 MG tablet Take 1 tablet (50 mg total) by mouth daily. 09/05/17  Yes Auburn Bilberry, MD    Allergies Patient has no known allergies.    Social History Social History   Tobacco Use  . Smoking status: Current Every Day Smoker    Packs/day: 0.50    Types: Cigarettes  . Smokeless tobacco: Never Used  Substance Use Topics  . Alcohol use: Yes    Comment: occasional   . Drug use: No    Review of Systems Patient denies headaches, rhinorrhea, blurry vision, numbness, shortness of breath, chest pain, edema, cough, abdominal pain, nausea, vomiting, diarrhea, dysuria, fevers, rashes or hallucinations unless otherwise stated above in HPI. ____________________________________________   PHYSICAL EXAM:  VITAL SIGNS: Vitals:   03/26/18 0928  BP: 122/84  Pulse: 76  Resp: 14  Temp: 97.9 F (36.6 C)  SpO2: 97%    Constitutional: Alert and oriented. Well appearing and in no acute distress. Eyes: Conjunctivae are normal.  Head: Atraumatic. Nose: No congestion/rhinnorhea. Mouth/Throat: Mucous membranes are moist.   Neck: No stridor. Painless ROM.  Cardiovascular: Normal rate, regular rhythm. Grossly normal heart sounds.  Good peripheral circulation. Respiratory: Normal respiratory  effort.  No retractions. Lungs CTAB. Gastrointestinal: Soft and nontender. No distention. No abdominal bruits. No CVA tenderness. Genitourinary:  Musculoskeletal: No lower extremity tenderness nor edema.  No joint effusions. Neurologic:  Normal speech and language. No gross focal neurologic deficits are appreciated. No facial droop Skin:  Skin is warm, dry and intact. No rash noted. Psychiatric: Mood and affect are normal. Speech and behavior are normal.  ____________________________________________   LABS (all labs ordered are listed, but only abnormal results are displayed)  Results  for orders placed or performed during the hospital encounter of 03/26/18 (from the past 24 hour(s))  Basic metabolic panel     Status: None   Collection Time: 03/26/18  9:44 AM  Result Value Ref Range   Sodium 137 135 - 145 mmol/L   Potassium 3.8 3.5 - 5.1 mmol/L   Chloride 103 101 - 111 mmol/L   CO2 25 22 - 32 mmol/L   Glucose, Bld 91 65 - 99 mg/dL   BUN 16 6 - 20 mg/dL   Creatinine, Ser 1.611.24 0.61 - 1.24 mg/dL   Calcium 9.2 8.9 - 09.610.3 mg/dL   GFR calc non Af Amer >60 >60 mL/min   GFR calc Af Amer >60 >60 mL/min   Anion gap 9 5 - 15  CBC     Status: Abnormal   Collection Time: 03/26/18  9:44 AM  Result Value Ref Range   WBC 6.8 3.8 - 10.6 K/uL   RBC 5.93 (H) 4.40 - 5.90 MIL/uL   Hemoglobin 16.7 13.0 - 18.0 g/dL   HCT 04.550.3 40.940.0 - 81.152.0 %   MCV 84.8 80.0 - 100.0 fL   MCH 28.2 26.0 - 34.0 pg   MCHC 33.2 32.0 - 36.0 g/dL   RDW 91.415.0 (H) 78.211.5 - 95.614.5 %   Platelets 440 150 - 440 K/uL  Urinalysis, Complete w Microscopic     Status: Abnormal   Collection Time: 03/26/18  1:16 PM  Result Value Ref Range   Color, Urine YELLOW (A) YELLOW   APPearance CLEAR (A) CLEAR   Specific Gravity, Urine 1.025 1.005 - 1.030   pH 5.0 5.0 - 8.0   Glucose, UA NEGATIVE NEGATIVE mg/dL   Hgb urine dipstick NEGATIVE NEGATIVE   Bilirubin Urine NEGATIVE NEGATIVE   Ketones, ur 20 (A) NEGATIVE mg/dL   Protein, ur NEGATIVE NEGATIVE mg/dL   Nitrite NEGATIVE NEGATIVE   Leukocytes, UA NEGATIVE NEGATIVE   RBC / HPF 0-5 0 - 5 RBC/hpf   WBC, UA 0-5 0 - 5 WBC/hpf   Bacteria, UA NONE SEEN NONE SEEN   Squamous Epithelial / LPF 0-5 (A) NONE SEEN   Mucus PRESENT    ____________________________________________  EKG My review and personal interpretation at Time: 9:40   Indication: weakness  Rate: 70  Rhythm: sinus Axis: normal Other: non specific t wave abn, no stemi ____________________________________________  RADIOLOGY  I personally reviewed all radiographic images ordered to evaluate for the above acute  complaints and reviewed radiology reports and findings.  These findings were personally discussed with the patient.  Please see medical record for radiology report.  ____________________________________________   PROCEDURES  Procedure(s) performed:  Procedures    Critical Care performed: no ____________________________________________   INITIAL IMPRESSION / ASSESSMENT AND PLAN / ED COURSE  Pertinent labs & imaging results that were available during my care of the patient were reviewed by me and considered in my medical decision making (see chart for details).  DDX: Dehydration, sepsis, pna, uti, hypoglycemia, cva, drug effect,  withdrawal, encephalitis   Cory Harvey is a 57 y.o. who presents to the ED with his as described above.  Very bizarre symptomatology that seemed of started over the weekend.  Does have some weakness and maybe some slurred speech but difficult to assess whether this is baseline given his history of stroke.  Denies any chest pain.  X-ray ordered to evaluate for right hip pain shows no evidence of fracture or dislocation.  CT head shows chronic microvascular disease but given the patient's presentation MRI ordered to evaluate for CVA.  MRI does show evidence of acute stroke.  Patient will require admission to the hospital for further medical management.      As part of my medical decision making, I reviewed the following data within the electronic MEDICAL RECORD NUMBER Nursing notes reviewed and incorporated, Labs reviewed, notes from prior ED visits and [place] Controlled Substance Database   ____________________________________________   FINAL CLINICAL IMPRESSION(S) / ED DIAGNOSES  Final diagnoses:  Cerebrovascular accident (CVA), unspecified mechanism (HCC)      NEW MEDICATIONS STARTED DURING THIS VISIT:  New Prescriptions   No medications on file     Note:  This document was prepared using Dragon voice recognition software and may include  unintentional dictation errors.    Willy Eddy, MD 03/26/18 [phone]

## 2018-03-26 NOTE — Progress Notes (Signed)
Advanced care plan.  Purpose of the Encounter: CODE STATUS  Parties in Attendance: Patient  Patient's Decision Capacity: Good  Subjective/Patient's story: Presented with right upper extremity weakness and slurred speech  Objective/Medical story Has CVA  Goals of care determination:  Advanced directives discussed with the patient Patient wants everything done that is cardiac resuscitation, intubation and ventilator if need arises  CODE STATUS: Full code  Time spent discussing advanced care planning: 16 minutes

## 2018-03-26 NOTE — ED Notes (Signed)
Patient c/o of 8/10 pain in right hip with no relief.

## 2018-03-26 NOTE — H&P (Signed)
[facility] [place] at [facility]   PATIENT NAME: Cory Harvey    MR#:  [id number]  DATE OF BIRTH:  [birth year]  DATE OF ADMISSION:  03/26/2018  PRIMARY CARE PHYSICIAN: Marisue Ivan, MD   REQUESTING/REFERRING PHYSICIAN:   CHIEF COMPLAINT:   Chief Complaint  Patient presents with  . Hip Pain  . Aphasia  . Hand Problem    HISTORY OF PRESENT ILLNESS: Cory Harvey  is a 57 y.o. male with a known history of CVA, tobacco abuse, hypertension presented to the emergency room with weakness in the right upper extremity and slurred speech.  This has been going on since Saturday.  Patient noticed slurred speech and also had difficulty with his right hand.  He also had some weakness in the right lower extremity.  He was evaluated in the emergency room with MRI brain which showed small acute infarcts in the right cerebral hemisphere.  Patient was started on aspirin.  Hospitalist service was consulted.  No complaints of any chest pain, shortness of breath.  Patient also says he had a fall but no head injury.  Hospitalist service was consulted for further care.  PAST MEDICAL HISTORY:   Past Medical History:  Diagnosis Date  . Depression   . History of CVA (cerebrovascular accident)   . Hypertension   . Stroke (HCC)    x 4  . Tobacco abuse     PAST SURGICAL HISTORY:  Past Surgical History:  Procedure Laterality Date  . kidney surgery Left 1983    SOCIAL HISTORY:  Social History   Tobacco Use  . Smoking status: Current Every Day Smoker    Packs/day: 0.50    Types: Cigarettes  . Smokeless tobacco: Never Used  Substance Use Topics  . Alcohol use: Yes    Comment: occasional     FAMILY HISTORY:  Family History  Problem Relation Age of Onset  . Diabetes Mother   . Heart disease Mother   . Hyperlipidemia Mother   . Hypertension Mother   . Stroke Mother   . Diabetes Father   . Hypertension Father   . Cancer Neg Hx     DRUG ALLERGIES: No Known  Allergies  REVIEW OF SYSTEMS:   CONSTITUTIONAL: No fever, fatigue or weakness.  EYES: No blurred or double vision.  EARS, NOSE, AND THROAT: No tinnitus or ear pain.  RESPIRATORY: No cough, shortness of breath, wheezing or hemoptysis.  CARDIOVASCULAR: No chest pain, orthopnea, edema.  GASTROINTESTINAL: No nausea, vomiting, diarrhea or abdominal pain.  GENITOURINARY: No dysuria, hematuria.  ENDOCRINE: No polyuria, nocturia,  HEMATOLOGY: No anemia, easy bruising or bleeding SKIN: No rash or lesion. MUSCULOSKELETAL: No joint pain or arthritis.   NEUROLOGIC: No tingling, numbness,  has right upper extremity weakness.  Has slurred speech. PSYCHIATRY: No anxiety or depression.   MEDICATIONS AT HOME:  Prior to Admission medications   Medication Sig Start Date End Date Taking? Authorizing Provider  aspirin EC 81 MG tablet Take 1 tablet (81 mg total) by mouth daily. 09/04/17 11/28/18 Yes Auburn Bilberry, MD  atorvastatin (LIPITOR) 10 MG tablet Take 2 tablets (20 mg total) by mouth daily at 6 PM. 09/04/17  Yes Auburn Bilberry, MD  clopidogrel (PLAVIX) 75 MG tablet Take 1 tablet (75 mg total) by mouth daily. 09/05/17  Yes Auburn Bilberry, MD  losartan (COZAAR) 50 MG tablet Take 1 tablet (50 mg total) by mouth daily. 09/05/17  Yes Auburn Bilberry, MD      PHYSICAL EXAMINATION:  VITAL SIGNS: Blood pressure 122/84, pulse 76, temperature 97.9 F (36.6 C), temperature source Oral, resp. rate 14, height 5\' 8"  (1.727 m), weight 82.1 kg (181 lb), SpO2 97 %.  GENERAL:  57 y.o.-year-old patient lying in the bed with no acute distress.  EYES: Pupils equal, round, reactive to light and accommodation. No scleral icterus. Extraocular muscles intact.  HEENT: Head atraumatic, normocephalic. Oropharynx and nasopharynx clear.  NECK:  Supple, no jugular venous distention. No thyroid enlargement, no tenderness.  LUNGS: Normal breath sounds bilaterally, no wheezing, rales,rhonchi or crepitation. No use of  accessory muscles of respiration.  CARDIOVASCULAR: S1, S2 normal. No murmurs, rubs, or gallops.  ABDOMEN: Soft, nontender, nondistended. Bowel sounds present. No organomegaly or mass.  EXTREMITIES: No pedal edema, cyanosis, or clubbing.  NEUROLOGIC: Cranial nerves II through XII are intact. Muscle strength 5/5 in left upper and left lower extremities. Strength 3/5 right upper extremity and 4/5 right lower extremity. Sensation intact. Gait not checked.  PSYCHIATRIC: The patient is alert and oriented x 3.  SKIN: No obvious rash, lesion, or ulcer.   LABORATORY PANEL:   CBC Recent Labs  Lab 03/26/18 0944  WBC 6.8  HGB 16.7  HCT 50.3  PLT 440  MCV 84.8  MCH 28.2  MCHC 33.2  RDW 15.0*   ------------------------------------------------------------------------------------------------------------------  Chemistries  Recent Labs  Lab 03/26/18 0944  NA 137  K 3.8  CL 103  CO2 25  GLUCOSE 91  BUN 16  CREATININE 1.24  CALCIUM 9.2   ------------------------------------------------------------------------------------------------------------------ estimated creatinine clearance is 68.7 mL/min (by C-G formula based on SCr of 1.24 mg/dL). ------------------------------------------------------------------------------------------------------------------ No results for input(s): TSH, T4TOTAL, T3FREE, THYROIDAB in the last 72 hours.  Invalid input(s): FREET3   Coagulation profile No results for input(s): INR, PROTIME in the last 168 hours. ------------------------------------------------------------------------------------------------------------------- No results for input(s): DDIMER in the last 72 hours. -------------------------------------------------------------------------------------------------------------------  Cardiac Enzymes No results for input(s): CKMB, TROPONINI, MYOGLOBIN in the last 168 hours.  Invalid input(s):  CK ------------------------------------------------------------------------------------------------------------------ Invalid input(s): POCBNP  ---------------------------------------------------------------------------------------------------------------  Urinalysis    Component Value Date/Time   COLORURINE YELLOW (A) 03/26/2018 1316   APPEARANCEUR CLEAR (A) 03/26/2018 1316   APPEARANCEUR Clear 03/09/2014 1955   LABSPEC 1.025 03/26/2018 1316   LABSPEC 1.013 03/09/2014 1955   PHURINE 5.0 03/26/2018 1316   GLUCOSEU NEGATIVE 03/26/2018 1316   GLUCOSEU Negative 03/09/2014 1955   HGBUR NEGATIVE 03/26/2018 1316   BILIRUBINUR NEGATIVE 03/26/2018 1316   BILIRUBINUR Negative 03/09/2014 1955   KETONESUR 20 (A) 03/26/2018 1316   PROTEINUR NEGATIVE 03/26/2018 1316   NITRITE NEGATIVE 03/26/2018 1316   LEUKOCYTESUR NEGATIVE 03/26/2018 1316   LEUKOCYTESUR Negative 03/09/2014 1955     RADIOLOGY: Ct Head Wo Contrast  Result Date: 03/26/2018 CLINICAL DATA:  Slurred speech and right arm tingling today. History of prior stroke. EXAM: CT HEAD WITHOUT CONTRAST TECHNIQUE: Contiguous axial images were obtained from the base of the skull through the vertex without intravenous contrast. COMPARISON:  Brain MRI 0 9 scratch the brain MRI and head CT 09/02/2017. FINDINGS: Brain: No evidence of acute infarction, hemorrhage, hydrocephalus, extra-axial collection or mass lesion/mass effect. Extensive chronic microvascular ischemic change is identified. Lacunar infarct in the right internal capsule is new since the prior studies but appears remote. Remote lacunar infarct in the pons also noted. Vascular: Atherosclerosis noted. Skull: Intact. Sinuses/Orbits: Negative. Other: None. IMPRESSION: No acute abnormality. Extensive chronic microvascular ischemic change. Atherosclerosis. Electronically Signed   By: Drusilla Kannerhomas  Dalessio M.D.   On: 03/26/2018 12:31   Mr Brain  Wo Contrast  Result Date: 03/26/2018 CLINICAL DATA:   Hand pain and slurred speech. Symptoms started over the weekend, and progressed. EXAM: MRI HEAD WITHOUT CONTRAST TECHNIQUE: Multiplanar, multiecho pulse sequences of the brain and surrounding structures were obtained without intravenous contrast. COMPARISON:  09/02/2017 FINDINGS: Brain: Numerous clustered small foci of restricted diffusion along the high right frontal and parietal convexities nearly reaching the occipital lobe, both cortical and subcortical. There is also parasagittal right parietal cortex involvement and involvement of the right splenium of the corpus callosum. Chronic small vessel ischemia with extensive white matter gliosis and numerous remote lacunar infarcts including in the bilateral deep gray nuclei, right pons, and bilateral deep white matter tracts. There are multiple signal abnormalities along the thin corpus callosum body. Although an atypical location for microvascular insults there is no history of demyelinating disease or trauma. There is however a strong vascular medical history. No acute hemorrhage. No hydrocephalus or masslike finding. Vascular: Major flow voids are preserved. Intracranial vessels are tortuous, correlating with hypertension history. Skull and upper cervical spine: Negative for marrow lesion. Cervical degenerative disc disease. Sinuses/Orbits: Negative IMPRESSION: 1. Multiple small acute infarcts in the right cerebral hemisphere, posterior watershed distribution. 2. Advanced chronic small vessel disease with multiple remote lacunar infarcts. Electronically Signed   By: Marnee Spring M.D.   On: 03/26/2018 14:58   Dg Hip Unilat W Or Wo Pelvis 2-3 Views Right  Result Date: 03/26/2018 CLINICAL DATA:  Right hip pain today.  No known injury. EXAM: DG HIP (WITH OR WITHOUT PELVIS) 2-3V RIGHT COMPARISON:  None. FINDINGS: There is no evidence of hip fracture or dislocation. There is no evidence of arthropathy or other focal bone abnormality. IMPRESSION: Normal exam.  Electronically Signed   By: Drusilla Kanner M.D.   On: 03/26/2018 12:56    EKG: Orders placed or performed during the hospital encounter of 03/26/18  . ED EKG  . ED EKG  . EKG 12-Lead  . EKG 12-Lead    IMPRESSION AND PLAN:  57 year old male patient with history of CVA, hypertension, tobacco abuse presented to the emergency room with slurred speech, right upper extremity weakness and weakness in the right lower extremity.  1.  Acute CVA Admit patient to medical floor Neurology consultation Check echocardiogram and carotid ultrasound  start patient on oral aspirin Neuro check every 4 hourly for 24 hours  2.  Hypertension Continue losartan for control of blood pressure  3.  Hyperlipidemia Resume Lipitor  4.  DVT prophylaxis with subcu Lovenox 40 mg daily  5.  Tobacco cessation counseled to the patient for 6 minutes Nicotine patch offered  All the records are reviewed and case discussed with ED provider. Management plans discussed with the patient, family and they are in agreement.  CODE STATUS:FULL CODE Code Status History    Date Active Date Inactive Code Status Order ID Comments User Context   09/02/2017 1936 09/04/2017 1722 Full Code [id number]  Marguarite Arbour, MD Inpatient       TOTAL TIME TAKING CARE OF THIS PATIENT: 50 minutes.    Ihor Austin M.D on 03/26/2018 at 4:31 PM  Between 7am to 6pm - Pager - [phone]  After 6pm go to [email or web address] - password [email or web address] [facility]  [facility] [place] [facility]  Office  [phone]  CC: Primary care physician; Marisue Ivan, MD

## 2018-03-26 NOTE — ED Triage Notes (Signed)
Says right hand pain with squeezine and right hip pain.  Says he feels he has  Had a stroke--has had before.  Says speech is slurred.  He says it started and progressed over the weekend..  Came today due to pain inhip.

## 2018-03-26 NOTE — ED Notes (Signed)
First Nurse Note:  Patient complaining of slurred speech and right arm tingling.   To Triage 3, Marisue IvanLiz RN notified.

## 2018-03-27 ENCOUNTER — Encounter: Payer: Self-pay | Admitting: Cardiology

## 2018-03-27 ENCOUNTER — Inpatient Hospital Stay
Admit: 2018-03-27 | Discharge: 2018-03-27 | Disposition: A | Discharge status: Home / Self Care | Payer: Medicare Other | Attending: Cardiology | Admitting: Cardiology

## 2018-03-27 ENCOUNTER — Encounter
Admission: EM | Disposition: A | Discharge status: Rehabilitation Facility | Payer: Self-pay | Source: Home / Self Care | Attending: Internal Medicine

## 2018-03-27 ENCOUNTER — Inpatient Hospital Stay: Payer: Medicare Other

## 2018-03-27 DIAGNOSIS — I639 Cerebral infarction, unspecified: Secondary | ICD-10-CM

## 2018-03-27 HISTORY — PX: TEE WITHOUT CARDIOVERSION: SHX5443

## 2018-03-27 LAB — ECHOCARDIOGRAM COMPLETE
Height: 68 in
Weight: 2896 oz

## 2018-03-27 LAB — LIPID PANEL
Cholesterol: 132 mg/dL (ref 0–200)
HDL: 49 mg/dL (ref >40)
LDL Cholesterol: 73 mg/dL (ref 0–99)
Total CHOL/HDL Ratio: 2.7 RATIO
Triglycerides: 49 mg/dL (ref <150)
VLDL: 10 mg/dL (ref 0–40)

## 2018-03-27 LAB — HEMOGLOBIN A1C
Hgb A1c MFr Bld: 5.5 % (ref 4.8–5.6)
Mean Plasma Glucose: 111.15 mg/dL

## 2018-03-27 SURGERY — ECHOCARDIOGRAM, TRANSESOPHAGEAL
Anesthesia: Moderate Sedation

## 2018-03-27 MED ORDER — MIDAZOLAM HCL 2 MG/2ML IJ SOLN
INTRAMUSCULAR | Status: AC | PRN
  Administered 2018-03-27: 1 mg via INTRAVENOUS

## 2018-03-27 MED ORDER — SODIUM CHLORIDE FLUSH 0.9 % IV SOLN
INTRAVENOUS | Status: AC
  Filled 2018-03-27: qty 10

## 2018-03-27 MED ORDER — MIDAZOLAM HCL 2 MG/2ML IJ SOLN
INTRAMUSCULAR | Status: AC | PRN
  Administered 2018-03-27: 2 mg via INTRAVENOUS
  Administered 2018-03-27: 1 mg via INTRAVENOUS

## 2018-03-27 MED ORDER — FENTANYL CITRATE (PF) 100 MCG/2ML IJ SOLN
INTRAMUSCULAR | Status: AC
  Filled 2018-03-27: qty 2

## 2018-03-27 MED ORDER — MIDAZOLAM HCL 5 MG/5ML IJ SOLN
INTRAMUSCULAR | Status: AC
  Filled 2018-03-27: qty 5

## 2018-03-27 MED ORDER — BUTAMBEN-TETRACAINE-BENZOCAINE 2-2-14 % EX AERO
INHALATION_SPRAY | CUTANEOUS | Status: AC
  Filled 2018-03-27: qty 5

## 2018-03-27 MED ORDER — FENTANYL CITRATE (PF) 100 MCG/2ML IJ SOLN
INTRAMUSCULAR | Status: AC | PRN
  Administered 2018-03-27: 25 ug via INTRAVENOUS

## 2018-03-27 MED ORDER — FENTANYL CITRATE (PF) 100 MCG/2ML IJ SOLN
INTRAMUSCULAR | Status: AC | PRN
  Administered 2018-03-27: 50 ug via INTRAVENOUS
  Administered 2018-03-27: 25 ug via INTRAVENOUS

## 2018-03-27 MED ORDER — TRAZODONE HCL 50 MG PO TABS
50.0000 mg | ORAL_TABLET | Freq: Every day | ORAL | Status: DC
  Administered 2018-03-27 – 2018-03-28 (×2): 50 mg via ORAL
  Filled 2018-03-27 (×3): qty 1

## 2018-03-27 MED ORDER — LIDOCAINE VISCOUS 2 % MT SOLN
OROMUCOSAL | Status: AC
  Filled 2018-03-27: qty 15

## 2018-03-27 MED ORDER — SODIUM CHLORIDE 0.9 % IV SOLN
INTRAVENOUS | Status: DC
  Administered 2018-03-27: 10:00:00 via INTRAVENOUS

## 2018-03-27 NOTE — Progress Notes (Signed)
Patient remains  Clinically stable port TEE with Dr Lady GaryFath , patient resting however arouses easily, denies complaints, vitals have remained stable. [facility] 1[facility] with questions answered.

## 2018-03-27 NOTE — CV Procedure (Addendum)
   TRANSESOPHAGEAL ECHOCARDIOGRAM   NAME:  Cory Harvey   MRN: [id number] DOB:  [birth year]   ADMIT DATE: 03/26/2018  INDICATIONS: Stroke  PROCEDURE:   Informed consent was obtained prior to the procedure. The risks, benefits and alternatives for the procedure were discussed and the patient comprehended these risks.  Risks include, but are not limited to, cough, sore throat, vomiting, nausea, somnolence, esophageal and stomach trauma or perforation, bleeding, low blood pressure, aspiration, pneumonia, infection, trauma to the teeth and death.    After a procedural time-out, the patient was given 3 mg versed and 100 mcg fentanyl for moderate sedation for 14 min.  The oropharynx was anesthetized 2 cc of topical 1% viscous lidocaine.  The transesophageal probe was inserted in the esophagus and stomach without difficulty and multiple views were obtained. I was present the entire time of the procedure.    COMPLICATIONS:    There were no immediate complications.  FINDINGS:  LEFT VENTRICLE: EF = 55%. No regional wall motion abnormalities.  RIGHT VENTRICLE: Normal size and function.   LEFT ATRIUM: Upper normal with no smoke or thrombus  LEFT ATRIAL APPENDAGE: No thrombus. Good doppler flow.   RIGHT ATRIUM: Upper normal. No thrombus  AORTIC VALVE:  Trileaflet. No vegetations or thrombus notes. Trivial ai  MITRAL VALVE:    Normal. No thrombus or vegetations  TRICUSPID VALVE: Normal.  PULMONIC VALVE: Grossly normal.  INTERATRIAL SEPTUM: No PFO or ASD. Agitated saline contrast shows no right to left shunt.   PERICARDIUM: No effusion  DESCENDING AORTA: Normal   CONCLUSION: Normal lv function. No cardiac source of emboli noted.

## 2018-03-27 NOTE — Evaluation (Signed)
Physical Therapy Evaluation Patient Details Name: Cory Harvey MRN: [id number] DOB: 1961/01/12 Today's Date: 03/27/2018   History of Present Illness  Patient is a 57 year old male admitted from home for a CVA following c/o R side weakness and slurred speech.  PMH includes Htn, depression, stroke, seizures, tobacco use and depression.  Clinical Impression  Pt is a 57 year old male who lives in a one story home alone.  He reports being independent with all activity at baseline with no use of AD.  Pt in bed and reports no pain upon PT arrival.  He is able to get to the EOB slowly without physical assist.  Pt presents with limited L shoulder ROM and strength and WNL R ROm and strength.  L LE's mildly weakened bilaterally.  Pt presents with a delay when receiving instructions and is not able to coordinate ROM bilaterally.  Finger to nose testing reveals significantly decreased coordination of L UE as compared to R.  Pt is limited in LE coordination testing due to soft tissue restriction and reporting that his joints "feel stiff".  Pt is able to stand from bed without physical assist but requires rocking motion and 3 attempts, with use of RW to complete upright posture.  Pt requires min A to ambulate 30 ft in room for clearance of obstacles.  He presents with gait deviations and slow gait indicative of fall risk.  Pt will continue to benefit from skilled PT with focus on strength, coordination, safe mobility, use of AD and balance.    Follow Up Recommendations CIR    Equipment Recommendations  Rolling walker with 5" wheels    Recommendations for Other Services       Precautions / Restrictions Precautions Precautions: Fall Restrictions Weight Bearing Restrictions: No      Mobility  Bed Mobility Overal bed mobility: Modified Independent             General bed mobility comments: Increased time and use of bed rail.  Transfers Overall transfer level: Needs assistance Equipment  used: Rolling walker (2 wheeled) Transfers: Sit to/from Stand Sit to Stand: Supervision         General transfer comment: Able to stand without physical assist.  Very unsteady on feet and required rocking motion, standing on third try.  Pt able to use RW to steady himself as he stood.  Is unfamiliar with use of RW.  Ambulation/Gait Ambulation/Gait assistance: Min assist Ambulation Distance (Feet): 20 Feet Assistive device: Rolling walker (2 wheeled)     Gait velocity interpretation: <1.8 ft/sec, indicate of risk for recurrent falls General Gait Details: Very slow gait in room with RW, PT providing assistance with clearing obstacles which pt does not appear to see though he did not demonstrate difficulty during visual field testing.  Maintains a wide BOS with out toeing of bilateral feet, significant difficulty in initiating swing phase with L LE.  PT provided VC's for sequencing when sitting into recliner.  Stairs            Wheelchair Mobility    Modified Rankin (Stroke Patients Only)       Balance Overall balance assessment: Modified Independent                                           Pertinent Vitals/Pain Pain Assessment: No/denies pain    Home Living Family/patient expects to be  discharged to:: Private residence Living Arrangements: Alone Available Help at Discharge: Available PRN/intermittently;Family(States that brother might be able to stay with him.) Type of Home: House Home Access: Stairs to enter Entrance Stairs-Rails: Can reach both Entrance Stairs-Number of Steps: 6 Home Layout: One level Home Equipment: Walker - 2 wheels      Prior Function Level of Independence: Independent         Comments: Pt able to drive and community ambulate     Hand Dominance   Dominant Hand: Left    Extremity/Trunk Assessment   Upper Extremity Assessment Upper Extremity Assessment: LUE deficits/detail LUE Deficits / Details: AROM: 45  degrees, shoulder and elbow strength grossly: 3-/5 LUE Coordination: decreased fine motor;decreased gross motor(Unable to perform RAM bilaterally with difficulty following directions, Finger to nose:  L: 20 sec., R: 11 sec.)    Lower Extremity Assessment Lower Extremity Assessment: Overall WFL for tasks assessed(Grossly 4-/5 bilaterally)    Cervical / Trunk Assessment Cervical / Trunk Assessment: Normal  Communication      Cognition Arousal/Alertness: Lethargic Behavior During Therapy: WFL for tasks assessed/performed Overall Cognitive Status: Within Functional Limits for tasks assessed                                        General Comments      Exercises     Assessment/Plan    PT Assessment Patient needs continued PT services  PT Problem List Decreased strength;Decreased mobility;Decreased balance;Decreased knowledge of use of DME;Decreased activity tolerance;Decreased coordination       PT Treatment Interventions DME instruction;Therapeutic activities;Gait training;Therapeutic exercise;Stair training;Balance training;Neuromuscular re-education;Functional mobility training;Patient/family education;Cognitive remediation    PT Goals (Current goals can be found in the Care Plan section)  Acute Rehab PT Goals Patient Stated Goal: To return home as soon as possible and walk like he used to. PT Goal Formulation: With patient Time For Goal Achievement: 04/17/18 Potential to Achieve Goals: Fair    Frequency 7X/week   Barriers to discharge        Co-evaluation               AM-PAC PT "6 Clicks" Daily Activity  Outcome Measure Difficulty turning over in bed (including adjusting bedclothes, sheets and blankets)?: A Little Difficulty moving from lying on back to sitting on the side of the bed? : A Little Difficulty sitting down on and standing up from a chair with arms (e.g., wheelchair, bedside commode, etc,.)?: A Little Help needed moving to and from  a bed to chair (including a wheelchair)?: A Lot Help needed walking in hospital room?: A Lot Help needed climbing 3-5 steps with a railing? : A Lot 6 Click Score: 15    End of Session Equipment Utilized During Treatment: Gait belt Activity Tolerance: Patient limited by fatigue Patient left: in chair;with chair alarm set;with call bell/phone within reach Nurse Communication: Mobility status PT Visit Diagnosis: Unsteadiness on feet (R26.81);Repeated falls (R29.6);Muscle weakness (generalized) (M62.81)    Time: 1130-1200 PT Time Calculation (min) (ACUTE ONLY): 30 min   Charges:   PT Evaluation $PT Eval Moderate Complexity: 1 Mod PT Treatments $Therapeutic Activity: 8-22 mins   PT G Codes:   PT G-Codes **NOT FOR INPATIENT CLASS** Functional Assessment Tool Used: AM-PAC 6 Clicks Basic Mobility    Glenetta Hew, PT, DPT   Glenetta Hew 03/27/2018, 1:57 PM

## 2018-03-27 NOTE — Evaluation (Signed)
Occupational Therapy Evaluation Patient Details Name: Cory Harvey MRN: [id number] DOB: 1961-11-22 Today's Date: 03/27/2018    History of Present Illness Patient is a 57 year old male admitted from home for a CVA following c/o R side weakness and slurred speech.  PMH includes Htn, depression, stroke, seizures, tobacco use and depression.   Clinical Impression   Met pt long sitting in bed, agreeable to therapy. Pt presents with decreased temporal awareness, safety, insight into deficits, and possible problem solving difficulty. When asked the president, pt replied "Ida Rogue", when reoriented pt burst into laughter with continued confusion. Asked pt situational safety questions, when asked "who would you call in an emergency", pt replies "father". When asked how to call the authorities, pt states "911". Pt also mentions needing to use hands to feed self meat loaf and and mashed potatoes due to difficulty using a fork (coordination/problem solving). Pt also needing safety cues on the importance of using call bell and not getting up to use BR independently in the night, pt agreed and able to identify call button. Proprioception assessment of BUEs not intact. Light touch assessment intact, with pt reports of "tingly" feeling and possible hot/cold impairment. BUE coordination most impaired, strength WFL. Pt would benefit from intensive neuro therapy to promote engagement in daily activities. Left pt in bed, call light/bed alarm in place.     Follow Up Recommendations  CIR    Equipment Recommendations       Recommendations for Other Services Rehab consult     Precautions / Restrictions Precautions Precautions: Fall Restrictions Weight Bearing Restrictions: No      Mobility Bed Mobility Overal bed mobility: Modified Independent                Transfers                      Balance Overall balance assessment: Modified Independent                                         ADL either performed or assessed with clinical judgement   ADL Overall ADL's : Needs assistance/impaired Eating/Feeding: Minimal assistance Eating/Feeding Details (indicate cue type and reason): Pt reports difficulty manipulating utensils and needing to use hands to eat foods requiring utensils (ex mashed potatoes) Grooming: Modified independent     Upper Body Bathing Details (indicate cue type and reason): 2/2 to cognitive deficits, safety and UE coordination deficits, anticipate pt needing min A    Lower Body Bathing Details (indicate cue type and reason): 2/2 to cognitive deficits, safety and UE coordination deficits, anticipate pt needing min A    Upper Body Dressing Details (indicate cue type and reason): 2/2 to cognitive deficits, safety and UE coordination deficits, anticipate pt needing min A    Lower Body Dressing Details (indicate cue type and reason): 2/2 to cognitive deficits, safety and UE coordination deficits, anticipate pt needing min A    Toilet Transfer Details (indicate cue type and reason): 2/2 to cognitive deficits, safety and UE coordination deficits, anticipate pt needing min A    Toileting - Clothing Manipulation Details (indicate cue type and reason): 2/2 to cognitive deficits, safety and UE coordination deficits, anticipate pt needing min A      Functional mobility during ADLs: Minimal assistance;Cueing for safety General ADL Comments: Overall pt presents with decreased safety awareness/insight to deficits, and UE  coordination deficits requiring min A to complete ADL routines     Vision Baseline Vision/History: Wears glasses Wears Glasses: At all times Patient Visual Report: No change from baseline       Perception     Praxis      Pertinent Vitals/Pain Pain Assessment: No/denies pain     Hand Dominance Left   Extremity/Trunk Assessment Upper Extremity Assessment Upper Extremity Assessment: LUE deficits/detail;RUE  deficits/detail;Difficult to assess due to impaired cognition RUE Deficits / Details: Difficult to assess AROM 2/2 to cognition, coordination and proprioception seems most prominently affected RUE Sensation: decreased proprioception RUE Coordination: decreased fine motor;decreased gross motor LUE Deficits / Details: Difficult to assess AROM 2/2 to cognition, coordination and proprioception seems most prominently affected LUE Sensation: decreased proprioception LUE Coordination: decreased fine motor;decreased gross motor   Lower Extremity Assessment Lower Extremity Assessment: Overall WFL for tasks assessed       Communication Communication Communication: Expressive difficulties   Cognition Arousal/Alertness: Awake/alert Behavior During Therapy: WFL for tasks assessed/performed Overall Cognitive Status: Impaired/Different from baseline Area of Impairment: Safety/judgement;Problem solving;Memory;Awareness                         Safety/Judgement: Decreased awareness of safety;Decreased awareness of deficits   Problem Solving: Slow processing;Requires verbal cues;Requires tactile cues General Comments: Pt asked orientation questions, replied "Ida Rogue" as president. When reoriented to current present, pt laughed and confused with misunderstanding.    General Comments       Exercises Other Exercises Other Exercises: Proprioception tested by placing pt extremity in position, unable to mimic on both sides. States "I have to see it first".   Shoulder Instructions      Home Living Family/patient expects to be discharged to:: Private residence Living Arrangements: Alone Available Help at Discharge: Available PRN/intermittently;Other (Comment)(father) Type of Home: House Home Access: Stairs to enter   Entrance Stairs-Rails: Can reach both Home Layout: One level     Bathroom Shower/Tub: Occupational psychologist: Standard     Home Equipment: Environmental consultant - 2  wheels;Other (comment)(has not previously used equipment, spare from family member)      Lives With: Alone    Prior Functioning/Environment Level of Independence: Independent        Comments: Pt was previously driving and independent in daily routines        OT Problem List: Decreased knowledge of use of DME or AE;Decreased coordination;Decreased cognition;Impaired UE functional use;Decreased safety awareness;Impaired sensation      OT Treatment/Interventions: Self-care/ADL training;Neuromuscular education    OT Goals(Current goals can be found in the care plan section) Acute Rehab OT Goals Patient Stated Goal: Pt will regain functional independence to engage in daily activity to increase quality of life  OT Frequency: Min 3X/week   Barriers to D/C:   Pt reports elderly father. Normally lives alone with no daily support       Co-evaluation              AM-PAC PT "6 Clicks" Daily Activity     Outcome Measure Help from another person eating meals?: A Little Help from another person taking care of personal grooming?: A Little Help from another person toileting, which includes using toliet, bedpan, or urinal?: A Little Help from another person bathing (including washing, rinsing, drying)?: A Little Help from another person to put on and taking off regular upper body clothing?: A Little Help from another person to put on and taking off regular  lower body clothing?: A Little 6 Click Score: 18   End of Session    Activity Tolerance: Patient tolerated treatment well Patient left: in bed;with call bell/phone within reach;with bed alarm set  OT Visit Diagnosis: Other abnormalities of gait and mobility (R26.89);Apraxia (R48.2);Cognitive communication deficit (R41.841);Other symptoms and signs involving the nervous system (R29.898);Other symptoms and signs involving cognitive function                Time: 1830-1856 OT Time Calculation (min): 26 min Charges:  OT General  Charges $OT Visit: 1 Visit OT Evaluation $OT Eval High Complexity: 1 High G-Codes:     Zenovia Jarred, MSOT, OTR/L   [place] 03/27/2018, 7:36 PM

## 2018-03-27 NOTE — Progress Notes (Signed)
Rehab Admissions Coordinator Note:  Patient was screened by Trish MageLogue, Margret Moat M for appropriateness for an Inpatient Acute Rehab Consult. Noted PT recommending CIR.  Would like to see OT evaluation.  Then can decide about possible inpatient rehab admission.  We would also [facility] if we decide on [facility].  Trish MageLogue, Jeanise Durfey M 03/27/2018, 3:41 PM  I can be reached at [phone].

## 2018-03-27 NOTE — Plan of Care (Signed)
  Problem: Education: Goal: Knowledge of General Education information will improve Outcome: Progressing   Problem: Education: Goal: Knowledge of disease or condition will improve Outcome: Progressing Goal: Knowledge of secondary prevention will improve Outcome: Progressing Goal: Knowledge of patient specific risk factors addressed and post discharge goals established will improve Outcome: Progressing   Problem: Coping: Goal: Will identify appropriate support needs Outcome: Progressing   Problem: Health Behavior/Discharge Planning: Goal: Ability to manage health-related needs will improve Outcome: Progressing   Problem: Self-Care: Goal: Ability to participate in self-care as condition permits will improve Outcome: Progressing Goal: Ability to communicate needs accurately will improve Outcome: Progressing   Problem: Nutrition: Goal: Risk of aspiration will decrease Outcome: Progressing   Problem: Ischemic Stroke/TIA Tissue Perfusion: Goal: Complications of ischemic stroke/TIA will be minimized Outcome: Progressing

## 2018-03-27 NOTE — Progress Notes (Signed)
PT Cancellation Note  Patient Details Name: Cory Harvey MRN: [id number] DOB: 09-17-1961   Cancelled Treatment:    Reason Eval/Treat Not Completed: Patient at procedure or test/unavailable. Order received.  Chart reviewed.  Pt our of room for a procedure.  Will re-attempt at a later time.   Glenetta HewSarah Ginnifer Creelman, PT, DPT 03/27/2018, 10:15 AM

## 2018-03-27 NOTE — Progress Notes (Signed)
*  PRELIMINARY RESULTS* Echocardiogram Echocardiogram Transesophageal has been performed.  Cristela BlueHege, Riann Oman 03/27/2018, 10:45 AM

## 2018-03-27 NOTE — Progress Notes (Signed)
OT Cancellation Note  Patient Details Name: Cory ChildWillie James Neeb MRN: [id number] DOB: 08/17/1961   Cancelled Treatment:    Reason Eval/Treat Not Completed: Patient at procedure or test/ unavailable. Order received, chart reviewed. Pt out of room for TEE. Will re-attempt OT evaluation at later date/time as pt is available and medically appropriate.  Richrd PrimeJamie Stiller, MPH, MS, OTR/L ascom [phone] 03/27/18, 11:01 AM

## 2018-03-27 NOTE — Care Management (Signed)
Onset of sx concerning for cva started 3 days prior to arrival in ED.  He has ruled in for cva. PT OT consults pending. Neuro has consulted Previous CVA 08/2017

## 2018-03-27 NOTE — Consult Note (Signed)
Referring Physician: Allena Katz    Chief Complaint: Slurred speech and numbness in the RUE  HPI: Cory Harvey is an 57 y.o. male who reports that on Saturday had the acute onset of slurred speech.  Also noted numbness in his right hand.  With further questioning also noted to have some LUE weakness.  Symptoms did not improve and patient presented for evaluation on yesterday.   Patient reports a stroke in the past about 2 years ago.  This affected his right side.  Has not been taking his ASA.  Initial NIHSS of 2.     Date last known well: Date: 03/23/2018 Time last known well: Unable to determine tPA Given: No: Outside time window  Past Medical History:  Diagnosis Date  . Depression   . History of CVA (cerebrovascular accident)   . Hypertension   . Stroke (HCC)    x 4  . Tobacco abuse     Past Surgical History:  Procedure Laterality Date  . kidney surgery Left 1983    Family History  Problem Relation Age of Onset  . Diabetes Mother   . Heart disease Mother   . Hyperlipidemia Mother   . Hypertension Mother   . Stroke Mother   . Diabetes Father   . Hypertension Father   . Cancer Neg Hx    Social History:  reports that he has been smoking cigarettes.  He has been smoking about 0.50 packs per day. He has never used smokeless tobacco. He reports that he drinks alcohol. He reports that he does not use drugs.  Allergies: No Known Allergies  Medications:  I have reviewed the patient's current medications. Prior to Admission:  Medications Prior to Admission  Medication Sig Dispense Refill Last Dose  . aspirin EC 81 MG tablet Take 1 tablet (81 mg total) by mouth daily. 150 tablet 2 03/26/2018 at Unknown time  . atorvastatin (LIPITOR) 10 MG tablet Take 2 tablets (20 mg total) by mouth daily at 6 PM. 30 tablet 0 03/26/2018 at Unknown time  . clopidogrel (PLAVIX) 75 MG tablet Take 1 tablet (75 mg total) by mouth daily. 30 tablet 0 03/26/2018 at Unknown time  . losartan (COZAAR) 50  MG tablet Take 1 tablet (50 mg total) by mouth daily. 30 tablet 0 03/26/2018 at Unknown time   Scheduled: . aspirin  300 mg Rectal Daily   Or  . aspirin EC  325 mg Oral Daily  . clopidogrel  75 mg Oral Daily  . enoxaparin (LOVENOX) injection  40 mg Subcutaneous Q24H  . losartan  50 mg Oral Daily    ROS: History obtained from the patient  General ROS: negative for - chills, fatigue, fever, night sweats, weight gain or weight loss Psychological ROS: negative for - behavioral disorder, hallucinations, memory difficulties, mood swings or suicidal ideation Ophthalmic ROS: negative for - blurry vision, double vision, eye pain or loss of vision ENT ROS: negative for - epistaxis, nasal discharge, oral lesions, sore throat, tinnitus or vertigo Allergy and Immunology ROS: negative for - hives or itchy/watery eyes Hematological and Lymphatic ROS: negative for - bleeding problems, bruising or swollen lymph nodes Endocrine ROS: negative for - galactorrhea, hair pattern changes, polydipsia/polyuria or temperature intolerance Respiratory ROS: negative for - cough, hemoptysis, shortness of breath or wheezing Cardiovascular ROS: negative for - chest pain, dyspnea on exertion, edema or irregular heartbeat Gastrointestinal ROS: negative for - abdominal pain, diarrhea, hematemesis, nausea/vomiting or stool incontinence Genito-Urinary ROS: negative for - dysuria, hematuria, incontinence or  urinary frequency/urgency Musculoskeletal ROS: right hip pain Neurological ROS: as noted in HPI Dermatological ROS: negative for rash and skin lesion changes  Physical Examination: Blood pressure 116/86, pulse (!) 58, temperature 98.4 F (36.9 C), temperature source Oral, resp. rate 16, height 5\' 8"  (1.727 m), weight 82.1 kg (181 lb), SpO2 97 %.  HEENT-  Normocephalic, no lesions, without obvious abnormality.  Normal external eye and conjunctiva.  Normal TM's bilaterally.  Normal auditory canals and external ears.  Normal external nose, mucus membranes and septum.  Normal pharynx. Cardiovascular- S1, S2 normal, pulses palpable throughout   Lungs- chest clear, no wheezing, rales, normal symmetric air entry Abdomen- soft, non-tender; bowel sounds normal; no masses,  no organomegaly Extremities- no edema Lymph-no adenopathy palpable Musculoskeletal-no joint tenderness, deformity or swelling Skin-warm and dry, no hyperpigmentation, vitiligo, or suspicious lesions  Neurological Examination   Mental Status: Alert, oriented.  Word finding errors and paraphasic errors noted.  Dysarthric.  Able to follow 3 step commands without difficulty. Cranial Nerves: II: Discs flat bilaterally; Visual fields grossly normal, pupils equal, round, reactive to light and accommodation III,IV, VI: ptosis not present, extra-ocular motions intact bilaterally V,VII: mild decrease in left NLF, facial light touch sensation normal bilaterally VIII: hearing normal bilaterally IX,X: gag reflex present XI: bilateral shoulder shrug XII: midline tongue extension Motor: Right : Upper extremity   5/5    Left:     Upper extremity   4+/5  Lower extremity   5/5     Lower extremity   5/5 Tone and bulk:normal tone throughout; no atrophy noted Sensory: Pinprick and light touch decreased in the RUE Deep Tendon Reflexes: 2+ and symmetric throughout Plantars: Right: downgoing   Left: upgoing Cerebellar: Mild dysmetria with right upper extremity finger-to-nose.  Normal heel-to-shin testing in the bilateral lower extremity  Gait: not tested due to safety concerns    Laboratory Studies:  Basic Metabolic Panel: Recent Labs  Lab 03/26/18 0944  NA 137  K 3.8  CL 103  CO2 25  GLUCOSE 91  BUN 16  CREATININE 1.24  CALCIUM 9.2    Liver Function Tests: No results for input(s): AST, ALT, ALKPHOS, BILITOT, PROT, ALBUMIN in the last 168 hours. No results for input(s): LIPASE, AMYLASE in the last 168 hours. No results for input(s): AMMONIA  in the last 168 hours.  CBC: Recent Labs  Lab 03/26/18 0944  WBC 6.8  HGB 16.7  HCT 50.3  MCV 84.8  PLT 440    Cardiac Enzymes: No results for input(s): CKTOTAL, CKMB, CKMBINDEX, TROPONINI in the last 168 hours.  BNP: Invalid input(s): POCBNP  CBG: No results for input(s): GLUCAP in the last 168 hours.  Microbiology: No results found for this or any previous visit.  Coagulation Studies: No results for input(s): LABPROT, INR in the last 72 hours.  Urinalysis:  Recent Labs  Lab 03/26/18 1316  COLORURINE YELLOW*  LABSPEC 1.025  PHURINE 5.0  GLUCOSEU NEGATIVE  HGBUR NEGATIVE  BILIRUBINUR NEGATIVE  KETONESUR 20*  PROTEINUR NEGATIVE  NITRITE NEGATIVE  LEUKOCYTESUR NEGATIVE    Lipid Panel:    Component Value Date/Time   CHOL 132 03/27/2018 0647   CHOL 150 09/21/2014 0408   TRIG 49 03/27/2018 0647   TRIG 40 09/21/2014 0408   HDL 49 03/27/2018 0647   HDL 76 (H) 09/21/2014 0408   CHOLHDL 2.7 03/27/2018 0647   VLDL 10 03/27/2018 0647   VLDL 8 09/21/2014 0408   LDLCALC 73 03/27/2018 0647   LDLCALC 66 09/21/2014 0408  HgbA1C:  Lab Results  Component Value Date   HGBA1C 5.4 09/04/2017    Urine Drug Screen:  No results found for: LABOPIA, COCAINSCRNUR, LABBENZ, AMPHETMU, THCU, LABBARB  Alcohol Level: No results for input(s): ETH in the last 168 hours.  Other results: EKG: sinus rhythm at 70 bpm.  Imaging: Ct Head Wo Contrast  Result Date: 03/26/2018 CLINICAL DATA:  Slurred speech and right arm tingling today. History of prior stroke. EXAM: CT HEAD WITHOUT CONTRAST TECHNIQUE: Contiguous axial images were obtained from the base of the skull through the vertex without intravenous contrast. COMPARISON:  Brain MRI 0 9 scratch the brain MRI and head CT 09/02/2017. FINDINGS: Brain: No evidence of acute infarction, hemorrhage, hydrocephalus, extra-axial collection or mass lesion/mass effect. Extensive chronic microvascular ischemic change is identified. Lacunar  infarct in the right internal capsule is new since the prior studies but appears remote. Remote lacunar infarct in the pons also noted. Vascular: Atherosclerosis noted. Skull: Intact. Sinuses/Orbits: Negative. Other: None. IMPRESSION: No acute abnormality. Extensive chronic microvascular ischemic change. Atherosclerosis. Electronically Signed   By: Drusilla Kannerhomas  Dalessio M.D.   On: 03/26/2018 12:31   [name]  Result Date: 03/26/2018 CLINICAL DATA:  Hand pain and slurred speech. Symptoms started over the weekend, and progressed. EXAM: MRI HEAD WITHOUT CONTRAST TECHNIQUE: Multiplanar, multiecho pulse sequences of the brain and surrounding structures were obtained without intravenous contrast. COMPARISON:  09/02/2017 FINDINGS: Brain: Numerous clustered small foci of restricted diffusion along the high right frontal and parietal convexities nearly reaching the occipital lobe, both cortical and subcortical. There is also parasagittal right parietal cortex involvement and involvement of the right splenium of the corpus callosum. Chronic small vessel ischemia with extensive white matter gliosis and numerous remote lacunar infarcts including in the bilateral deep gray nuclei, right pons, and bilateral deep white matter tracts. There are multiple signal abnormalities along the thin corpus callosum body. Although an atypical location for microvascular insults there is no history of demyelinating disease or trauma. There is however a strong vascular medical history. No acute hemorrhage. No hydrocephalus or masslike finding. Vascular: Major flow voids are preserved. Intracranial vessels are tortuous, correlating with hypertension history. Skull and upper cervical spine: Negative for marrow lesion. Cervical degenerative disc disease. Sinuses/Orbits: Negative IMPRESSION: 1. Multiple small acute infarcts in the right cerebral hemisphere, posterior watershed distribution. 2. Advanced chronic small vessel disease with  multiple remote lacunar infarcts. Electronically Signed   By: Marnee SpringJonathon  Watts M.D.   On: 03/26/2018 14:58   [place] Carotid Bilateral (at [facility] And [facility] Only)  Result Date: 03/27/2018 CLINICAL DATA:  57 year old male with a history of cerebrovascular accident. Cardiovascular risk factors include hypertension, stroke/TIA, tobacco use EXAM: BILATERAL CAROTID DUPLEX ULTRASOUND TECHNIQUE: Wallace CullensGray scale imaging, color Doppler and duplex ultrasound were performed of bilateral carotid and vertebral arteries in the neck. COMPARISON:  03/09/2014 FINDINGS: Criteria: Quantification of carotid stenosis is based on velocity parameters that correlate the residual internal carotid diameter with NASCET-based stenosis levels, using the diameter of the distal internal carotid lumen as the denominator for stenosis measurement. The following velocity measurements were obtained: RIGHT ICA:  Systolic 38 cm/sec, Diastolic 15 cm/sec CCA:  43 cm/sec SYSTOLIC ICA/CCA RATIO:  0.9 ECA:  52 cm/sec LEFT ICA:  Systolic 27 cm/sec, Diastolic 10 cm/sec CCA:  51 cm/sec SYSTOLIC ICA/CCA RATIO:  0.5 ECA:  59 cm/sec Right Brachial SBP: Not acquired Left Brachial SBP: Not acquired RIGHT CAROTID ARTERY: No significant calcified disease of the right common carotid artery. Intermediate waveform  maintained. Heterogeneous plaque without significant calcifications at the right carotid bifurcation. Low resistance waveform of the right ICA. No significant tortuosity. RIGHT VERTEBRAL ARTERY: Antegrade flow with low resistance waveform. LEFT CAROTID ARTERY: No significant calcified disease of the left common carotid artery. Intermediate waveform maintained. Heterogeneous plaque at the left carotid bifurcation without significant calcifications. Low resistance waveform of the left ICA. LEFT VERTEBRAL ARTERY:  Antegrade flow with low resistance waveform. IMPRESSION: Color duplex indicates minimal heterogeneous plaque, with no hemodynamically significant stenosis by duplex  criteria in the extracranial cerebrovascular circulation. Signed, Yvone Neu. Loreta Ave, DO [facility] Electronically Signed   By: Gilmer Mor D.O.   On: 03/27/2018 09:01   Dg Hip Unilat W Or Wo Pelvis 2-3 Views Right  Result Date: 03/26/2018 CLINICAL DATA:  Right hip pain today.  No known injury. EXAM: DG HIP (WITH OR WITHOUT PELVIS) 2-3V RIGHT COMPARISON:  None. FINDINGS: There is no evidence of hip fracture or dislocation. There is no evidence of arthropathy or other focal bone abnormality. IMPRESSION: Normal exam. Electronically Signed   By: Drusilla Kanner M.D.   On: 03/26/2018 12:56    Assessment: 57 y.o. male with a history of stroke presenting with slurred speech, RUE numbness and LUE weakness.  MRI of the brain reviewed and shows multiple, bilateral, acute right cerebral infarcts.  Embolic etiology suspected.  Patient on no antiplatelet therapy at home.  Carotid dopplers show no evidence of hemodynamically significant stenosis.  Echocardiogram shows no cardiac source of emboli with an EF of 60-65%.  A1c pending, LDL 73.  Stroke Risk Factors - hyperlipidemia, hypertension and smoking  Plan: 1. PT consult, OT consult, Speech consult 2. Smoking cessation counseling 3. Prophylactic therapy-Antiplatelet med: Aspirin - dose 325mg  daily.  Patient not taking ASA prior to admission  4. NPO until RN stroke swallow screen 5. Telemetry monitoring 6. Frequent neuro checks 7. TEE with loop monitor if TEE unremarkable 8. Aggressive lipid management with target LDL<70.   Thana Farr, MD Neurology [phone] 03/27/2018, 9:11 AM

## 2018-03-27 NOTE — Care Management (Signed)
Reached out to [facility] to follow patient for possible acute inpatient rehab. Physical therapy states that patient would be able to participate in at least 3 hours of therapy a day.  He does live alone.

## 2018-03-27 NOTE — Progress Notes (Signed)
[facility] - [place] at [facility]   PATIENT NAME: Cory Harvey    MR#:  [id number]  DATE OF BIRTH:  [birth year]  SUBJECTIVE:  patient came in with a aphasia and right-sided weakness. Speech improving although does have some dysarthria. Right lower extremity weakness improving. He is hungry.  REVIEW OF SYSTEMS:   Review of Systems  Constitutional: Negative for chills, fever and weight loss.  HENT: Negative for ear discharge, ear pain and nosebleeds.   Eyes: Negative for blurred vision, pain and discharge.  Respiratory: Negative for sputum production, shortness of breath, wheezing and stridor.   Cardiovascular: Negative for chest pain, palpitations, orthopnea and PND.  Gastrointestinal: Negative for abdominal pain, diarrhea, nausea and vomiting.  Genitourinary: Negative for frequency and urgency.  Musculoskeletal: Negative for back pain and joint pain.  Neurological: Positive for speech change and weakness. Negative for sensory change and focal weakness.  Psychiatric/Behavioral: Negative for depression and hallucinations. The patient is not nervous/anxious.    Tolerating Diet:yes Tolerating PT: pending  DRUG ALLERGIES:  No Known Allergies  VITALS:  Blood pressure 107/82, pulse (!) 56, temperature 98.3 F (36.8 C), temperature source Oral, resp. rate 20, height 5\' 8"  (1.727 m), weight 82.1 kg (181 lb), SpO2 98 %.  PHYSICAL EXAMINATION:   Physical Exam  GENERAL:  56 y.o.-year-old patient lying in the bed with no acute distress.  EYES: Pupils equal, round, reactive to light and accommodation. No scleral icterus. Extraocular muscles intact.  HEENT: Head atraumatic, normocephalic. Oropharynx and nasopharynx clear.  NECK:  Supple, no jugular venous distention. No thyroid enlargement, no tenderness.  LUNGS: Normal breath sounds bilaterally, no wheezing, rales, rhonchi. No use of accessory muscles of respiration.  CARDIOVASCULAR: S1, S2 normal. No murmurs,  rubs, or gallops.  ABDOMEN: Soft, nontender, nondistended. Bowel sounds present. No organomegaly or mass.  EXTREMITIES: No cyanosis, clubbing or edema b/l.    NEUROLOGIC: Cranial nerves II through XII are intact. No focal Motor or sensory deficits b/l. Dysarthria PSYCHIATRIC:  patient is alert and oriented x 3.  SKIN: No obvious rash, lesion, or ulcer.   LABORATORY PANEL:  CBC Recent Labs  Lab 03/26/18 0944  WBC 6.8  HGB 16.7  HCT 50.3  PLT 440    Chemistries  Recent Labs  Lab 03/26/18 0944  NA 137  K 3.8  CL 103  CO2 25  GLUCOSE 91  BUN 16  CREATININE 1.24  CALCIUM 9.2   Cardiac Enzymes No results for input(s): TROPONINI in the last 168 hours. RADIOLOGY:  Ct Head Wo Contrast  Result Date: 03/26/2018 CLINICAL DATA:  Slurred speech and right arm tingling today. History of prior stroke. EXAM: CT HEAD WITHOUT CONTRAST TECHNIQUE: Contiguous axial images were obtained from the base of the skull through the vertex without intravenous contrast. COMPARISON:  Brain MRI 0 9 scratch the brain MRI and head CT 09/02/2017. FINDINGS: Brain: No evidence of acute infarction, hemorrhage, hydrocephalus, extra-axial collection or mass lesion/mass effect. Extensive chronic microvascular ischemic change is identified. Lacunar infarct in the right internal capsule is new since the prior studies but appears remote. Remote lacunar infarct in the pons also noted. Vascular: Atherosclerosis noted. Skull: Intact. Sinuses/Orbits: Negative. Other: None. IMPRESSION: No acute abnormality. Extensive chronic microvascular ischemic change. Atherosclerosis. Electronically Signed   By: Drusilla Kanner M.D.   On: 03/26/2018 12:31   [name]  Result Date: 03/26/2018 CLINICAL DATA:  Hand pain and slurred speech. Symptoms started over the weekend, and progressed. EXAM: MRI HEAD  WITHOUT CONTRAST TECHNIQUE: Multiplanar, multiecho pulse sequences of the brain and surrounding structures were obtained without  intravenous contrast. COMPARISON:  09/02/2017 FINDINGS: Brain: Numerous clustered small foci of restricted diffusion along the high right frontal and parietal convexities nearly reaching the occipital lobe, both cortical and subcortical. There is also parasagittal right parietal cortex involvement and involvement of the right splenium of the corpus callosum. Chronic small vessel ischemia with extensive white matter gliosis and numerous remote lacunar infarcts including in the bilateral deep gray nuclei, right pons, and bilateral deep white matter tracts. There are multiple signal abnormalities along the thin corpus callosum body. Although an atypical location for microvascular insults there is no history of demyelinating disease or trauma. There is however a strong vascular medical history. No acute hemorrhage. No hydrocephalus or masslike finding. Vascular: Major flow voids are preserved. Intracranial vessels are tortuous, correlating with hypertension history. Skull and upper cervical spine: Negative for marrow lesion. Cervical degenerative disc disease. Sinuses/Orbits: Negative IMPRESSION: 1. Multiple small acute infarcts in the right cerebral hemisphere, posterior watershed distribution. 2. Advanced chronic small vessel disease with multiple remote lacunar infarcts. Electronically Signed   By: Marnee SpringJonathon  Watts M.D.   On: 03/26/2018 14:58   [place] Carotid Bilateral (at [facility] Only)  Result Date: 03/27/2018 CLINICAL DATA:  57 year old male with a history of cerebrovascular accident. Cardiovascular risk factors include hypertension, stroke/TIA, tobacco use EXAM: BILATERAL CAROTID DUPLEX ULTRASOUND TECHNIQUE: Wallace CullensGray scale imaging, color Doppler and duplex ultrasound were performed of bilateral carotid and vertebral arteries in the neck. COMPARISON:  03/09/2014 FINDINGS: Criteria: Quantification of carotid stenosis is based on velocity parameters that correlate the residual internal carotid diameter with  NASCET-based stenosis levels, using the diameter of the distal internal carotid lumen as the denominator for stenosis measurement. The following velocity measurements were obtained: RIGHT ICA:  Systolic 38 cm/sec, Diastolic 15 cm/sec CCA:  43 cm/sec SYSTOLIC ICA/CCA RATIO:  0.9 ECA:  52 cm/sec LEFT ICA:  Systolic 27 cm/sec, Diastolic 10 cm/sec CCA:  51 cm/sec SYSTOLIC ICA/CCA RATIO:  0.5 ECA:  59 cm/sec Right Brachial SBP: Not acquired Left Brachial SBP: Not acquired RIGHT CAROTID ARTERY: No significant calcified disease of the right common carotid artery. Intermediate waveform maintained. Heterogeneous plaque without significant calcifications at the right carotid bifurcation. Low resistance waveform of the right ICA. No significant tortuosity. RIGHT VERTEBRAL ARTERY: Antegrade flow with low resistance waveform. LEFT CAROTID ARTERY: No significant calcified disease of the left common carotid artery. Intermediate waveform maintained. Heterogeneous plaque at the left carotid bifurcation without significant calcifications. Low resistance waveform of the left ICA. LEFT VERTEBRAL ARTERY:  Antegrade flow with low resistance waveform. IMPRESSION: Color duplex indicates minimal heterogeneous plaque, with no hemodynamically significant stenosis by duplex criteria in the extracranial cerebrovascular circulation. Signed, Yvone NeuJaime S. Loreta AveWagner, DO [facility] Electronically Signed   By: Gilmer MorJaime  Wagner D.O.   On: 03/27/2018 09:01   Dg Hip Unilat W Or Wo Pelvis 2-3 Views Right  Result Date: 03/26/2018 CLINICAL DATA:  Right hip pain today.  No known injury. EXAM: DG HIP (WITH OR WITHOUT PELVIS) 2-3V RIGHT COMPARISON:  None. FINDINGS: There is no evidence of hip fracture or dislocation. There is no evidence of arthropathy or other focal bone abnormality. IMPRESSION: Normal exam. Electronically Signed   By: Drusilla Kannerhomas  Dalessio M.D.   On: 03/26/2018 12:56   ASSESSMENT AND PLAN:   Cory ShropshireWillie Harvey  is a 57 y.o. male with a known history of CVA, tobacco abuse, hypertension  presented to the emergency room with weakness in the right upper extremity and slurred speech.  This has been going on since Saturday.  Patient noticed slurred speech and also had difficulty with his right hand.  He also had some weakness in the right lower extremity.  1.  Acute CVA --MRI showed Multiple small acute infarcts in the right cerebral hemisphere, posterior watershed distribution. --Neurology consultation appreciated. Continue aspirin 320 50 g daily along with statins and blood pressure med -transthoracic echocardiogram within normal limits  -transesophageal echocardiogram within normal limits. No embolic source found.  -carotid ultrasound no hemodynamically significant stenosis  -continue aspirin 325 mg daily and statins Neuro check every 4 hourly for 24 hours  2.  Hypertension Continue losartan for control of blood pressure  3.  Hyperlipidemia Resume Lipitor  4.  DVT prophylaxis with subcu Lovenox 40 mg daily  5.  Tobacco cessation counseled to the patient for 6 minutes Nicotine patch offered  physical therapy occupational and speech therapy to see patient. Continues to improve with discharged home tomorrow with Holter monitor for 48 to 72 hours   Case discussed with Care Management/Social Worker. Management plans discussed with the patient, family and they are in agreement.  CODE STATUS: full  DVT Prophylaxis: Lovenox  TOTAL TIME TAKING CARE OF THIS PATIENT: 30 minutes.  >50% time spent on counselling and coordination of care  POSSIBLE D/C IN 1 DAYS, DEPENDING ON CLINICAL CONDITION.  Note: This dictation was prepared with Dragon dictation along with smaller phrase technology. Any transcriptional errors that result from this process are unintentional.  Enedina Finner M.D on 03/27/2018 at 1:39 PM  Between 7am to 6pm - Pager - [phone]  After 6pm go to [email or web address] -  Social research officer, government  [facility]  Office  [phone]  CC: Primary care physician; Marisue Ivan, MDPatient ID: Lynnda Child, male   DOB: [birth year], 60 y.o.   MRN: [id number]

## 2018-03-27 NOTE — Evaluation (Addendum)
Speech Language Pathology Evaluation Patient Details Name: Cory Harvey MRN: [id number] DOB: [birth year] Today's Date: 03/27/2018 Time: 1530-1630 SLP Time Calculation (min) (ACUTE ONLY): 60 min  Problem List:  Patient Active Problem List   Diagnosis Date Noted  . CVA (cerebral vascular accident) (HCC) 03/26/2018  . Acute CVA (cerebrovascular accident) (HCC) 09/02/2017  . Gait disturbance 09/02/2017  . History of CVA (cerebrovascular accident) 04/18/2017  . Tobacco use 04/18/2017  . Depression   . Hypertension   . Stroke [facility])    Past Medical History:  Past Medical History:  Diagnosis Date  . Depression   . History of CVA (cerebrovascular accident)   . Hypertension   . Stroke (HCC)    x 4  . Tobacco abuse    Past Surgical History:  Past Surgical History:  Procedure Laterality Date  . kidney surgery Left 1983   HPI:  Pt is a 57 y.o. male with a known history of CVA(x4 per MD notes), tobacco abuse, ETOH use, hypertension not taking medications regularly who presented to the emergency room with weakness in the right upper extremity (chart reports a stroke in 08/2017 w/ transient right-sided weakness with slurred speech, s/s improved) and noted to have some LUE weakness, also slurred speech. This has been going on since Friday/Saturday. He was evaluated in the emergency room with MRI brain which showed small acute infarcts in the right cerebral hemisphere. Patient was started on aspirin. Hospitalist service was consulted. MRI revealed multiple small acute infarcts in the right cerebral hemisphere along the high right frontal and parietal convexities nearly reaching the occipital lobe; a posterior watershed distribution. Pt stated he had had "stuttering before"(previous stroke?) but it seemed min worse currently. Pt did exhibit LUE weakness during this assessment.    Assessment / Plan / Recommendation Clinical Impression  Pt was seen today for informal screening of Cognitive  skills/abilities using the [facility] Cognitive Assessment (MOCA-B). Pt's education level: HS. Pt demonstrated a score of 16/30 indicating decline in Cognitive functioning. Pt's Orientation, Immediate Recall, and Naming skills were grossly WFL. However, pt exhibited decreased proficiency and accuracy in the other skill areas of Executive Function, Fluency, Delayed Recall, Calculation, Visuoperceptual, Abstraction, and Attention; discriminating Attention tasks and Visuoperceptual tasks were the most challenging for him. In the other tasks, pt responded well to simple verbal, semantic cues to increase awareness and accuracy of the task (ie., in tasks of delayed recall, calculation, abstraction, pt was able to improve to 100% accuracy w/ semantic cue each time). During the Fluency task, pt was able to increase recall of items of the group/set w/ semantic cues but often perseverated and repeated items. During verbal conversation/interactions, pt exhibited min, intermittent Dysfluency of speech, "stuttering", which pt stated he "had before" when he had a previous CVA. When pt slowed down and repeated himself, took a deep breath and relaxed, the dysfluency lessened. Pt's speech was also c/b slight-min Dysarthria in certain speech sounds, but overall intelligibility of speech was 90-100% at sentence/conversation level. Pt's emotional state was c/b intermittent laughter outburst(min nervous at his deficits he stated?) - pt was aware he was doing it. During a problem-solving situation, pt was unable to devise a plan to find out his father's phone number after he realized his own cell phone battery was dead(the number listed in his phone). Of note, unsure if pt has slight Left decreased awareness from a previous CVA - noted he approached visual tasks from Right to Left(during a picture description task) - he fully attended  to the Left during the task w/ simple verbal cue.  Pt would benefit from continued ST services for skilled  Cognitive-linguistic therapy to address Cognitive-communication tasks in ADLs, Dysfluency strategies/education, and overall Safety awareness in ADLs.     SLP Assessment  SLP Recommendation/Assessment: All further Speech Lanaguage Pathology  needs can be addressed in the next venue of care(pt could benefit from Inpatient setting tx) SLP Visit Diagnosis: Dysarthria and anarthria (R47.1);Frontal lobe and executive function deficit;Cognitive communication deficit (R41.841);Attention and concentration deficit Attention and concentration deficit following: Cerebral infarction Frontal lobe and executive function deficit following: Cerebral infarction    Follow Up Recommendations  Inpatient Rehab    Frequency and Duration (in Inpatient setting TBD)  (TBD)      SLP Evaluation Cognition  Overall Cognitive Status: No family/caregiver present to determine baseline cognitive functioning Arousal/Alertness: Awake/alert Orientation Level: Oriented to person;Oriented to place;Oriented to situation(unsure of year) Attention: Focused;Sustained;Alternating Focused Attention: Impaired Focused Attention Impairment: Verbal basic;Functional basic(WFL; not complex) Sustained Attention: Impaired Sustained Attention Impairment: Verbal basic;Functional basic(WFL; not complex) Alternating Attention: Impaired Alternating Attention Impairment: Verbal basic;Functional basic(grossly WFL; not complex) Memory: Impaired Memory Impairment: Decreased recall of new information(delayed recall of info) Awareness: Impaired Problem Solving: Impaired Problem Solving Impairment: (could not problem solve finding a phone #) Executive Function: Reasoning Reasoning: Impaired Reasoning Impairment: Verbal basic;Functional basic(grossly WFL; not complex) Behaviors: Restless(min; often laughed aloud) Safety/Judgment: Appears intact(grossly WFL)       Comprehension  Auditory Comprehension Overall Auditory Comprehension: Appears  within functional limits for tasks assessed Yes/No Questions: Within Functional Limits(basic Y/N questions) Commands: Within Functional Limits(1-2 step commands) Conversation: Simple Other Conversation Comments: min dysfluency  Interfering Components: Motor planning EffectiveTechniques: Slowed speech;Pausing Visual Recognition/Discrimination Discrimination: Not tested Reading Comprehension Reading Status: Not tested    Expression Expression Primary Mode of Expression: Verbal Verbal Expression Overall Verbal Expression: Appears within functional limits for tasks assessed Initiation: No impairment Automatic Speech: Name;Social Response;Counting;Day of week Level of Generative/Spontaneous Verbalization: Word;Phrase Repetition: No impairment Naming: No impairment Pragmatics: Impairment Impairments: Abnormal affect(laughed aloud at times) Interfering Components: Attention Non-Verbal Means of Communication: Not applicable Other Verbal Expression Comments: min dysfluency Written Expression Dominant Hand: Left Written Expression: Not tested(but weak grib/use of pencil )   Oral / Motor  Oral Motor/Sensory Function Overall Oral Motor/Sensory Function: Within functional limits Motor Speech Overall Motor Speech: Impaired Respiration: Within functional limits Phonation: Normal Resonance: Within functional limits Articulation: Impaired Level of Impairment: Conversation Intelligibility: Intelligibility reduced(slight-min(unsure of baseline-report of same in 2018)) Word: 75-100% accurate Phrase: 75-100% accurate Sentence: 75-100% accurate Conversation: 75-100% accurate Motor Planning: Impaired("had some stuttering in past too") Level of Impairment: Press photographerConversation Motor Speech Errors: Aware;Inconsistent Interfering Components: (wears dentures) Effective Techniques: Slow rate;Increased vocal intensity;Pause   GO                     Jerilynn SomKatherine Watson, MS,  CCC-SLP Watson,Katherine 03/27/2018, 5:56 PM

## 2018-03-28 MED ORDER — ATORVASTATIN CALCIUM 20 MG PO TABS
20.0000 mg | ORAL_TABLET | Freq: Every day | ORAL | Status: DC
  Administered 2018-03-28: 19:00:00 20 mg via ORAL
  Filled 2018-03-28: qty 1

## 2018-03-28 NOTE — Progress Notes (Signed)
Physical Therapy Treatment Patient Details Name: Cory Harvey MRN: [id number] DOB: 1960-12-25 Today's Date: 03/28/2018    History of Present Illness Patient is a 57 year old male admitted from home for a CVA following c/o R side weakness and slurred speech.  PMH includes Htn, depression, stroke, seizures, tobacco use and depression.    PT Comments    Pt in room, unable to find remote which was laying beside him on the bed.  Easily distracted but agrees to session.  Transitions to edge of bed with supervision as he exhibited some impulsiveness.  Pt reported feeling "bad" this am but unable to give further details besides stating some increased weakness.  5X sit to stand with moderate verbal cues for hand placements.  He was able to march in place but was hesitant to put full weight on RLE.  2nd assist obtained for safety.  He was however able to walk around unit x 1 with walker and +1 min guard/assist as he had difficulty navigating around objects and at times would take hands off of walker handles or put them too far in front off of handle pads.  WC follow +1.  He was able to make it around unit without rest but with decreased gait speed and general fatigue upon return to room.  He remains unsafe to walk without assist due to balance, safety, decreased judgement and problem solving at this time and weakness.    CIR remains an appropriate discharge disposition at this time to increase and return to PLOF.   Follow Up Recommendations  CIR     Equipment Recommendations  Rolling walker with 5" wheels    Recommendations for Other Services       Precautions / Restrictions Precautions Precautions: Fall Restrictions Weight Bearing Restrictions: No    Mobility  Bed Mobility Overal bed mobility: Modified Independent             General bed mobility comments: impulsive, uses rail  Transfers Overall transfer level: Needs assistance Equipment used: Rolling walker (2  wheeled) Transfers: Sit to/from Stand Sit to Stand: Min guard         General transfer comment: 5 x sit to stand with mod verbal cues each attempt for hand placement.    Ambulation/Gait Ambulation/Gait assistance: Min guard;Min assist Ambulation Distance (Feet): 160 Feet Assistive device: Rolling walker (2 wheeled)   Gait velocity: decreased   General Gait Details: Pt hesitant to walk stating he feels weaker today but overall improved distance.  Poor awareness of obstacels in room and often takes hands off walker and tries to walk without it especially during turns.   Stairs             Wheelchair Mobility    Modified Rankin (Stroke Patients Only)       Balance Overall balance assessment: Needs assistance Sitting-balance support: Feet supported Sitting balance-Leahy Scale: Good Sitting balance - Comments: supervision for safety due to impulsiveness   Standing balance support: Bilateral upper extremity supported Standing balance-Leahy Scale: Poor Standing balance comment: requires assistance due to inattentiveness                            Cognition Arousal/Alertness: Awake/alert Behavior During Therapy: WFL for tasks assessed/performed Overall Cognitive Status: Impaired/Different from baseline Area of Impairment: Safety/judgement;Problem solving;Memory;Awareness                         Safety/Judgement: Decreased  awareness of safety;Decreased awareness of deficits   Problem Solving: Slow processing;Requires verbal cues;Requires tactile cues        Exercises      General Comments        Pertinent Vitals/Pain Pain Assessment: No/denies pain    Home Living                      Prior Function            PT Goals (current goals can now be found in the care plan section) Progress towards PT goals: Progressing toward goals    Frequency    7X/week      PT Plan Current plan remains appropriate     Co-evaluation              AM-PAC PT "6 Clicks" Daily Activity  Outcome Measure  Difficulty turning over in bed (including adjusting bedclothes, sheets and blankets)?: None Difficulty moving from lying on back to sitting on the side of the bed? : None Difficulty sitting down on and standing up from a chair with arms (e.g., wheelchair, bedside commode, etc,.)?: A Little Help needed moving to and from a bed to chair (including a wheelchair)?: A Little Help needed walking in hospital room?: A Lot Help needed climbing 3-5 steps with a railing? : A Lot 6 Click Score: 18    End of Session Equipment Utilized During Treatment: Gait belt Activity Tolerance: Patient tolerated treatment well;Patient limited by fatigue Patient left: in chair;with chair alarm set;with call bell/phone within reach         Time: 0850-0903 PT Time Calculation (min) (ACUTE ONLY): 13 min  Charges:  $Gait Training: 8-22 mins                    G Codes:        Danielle DessSarah Olivya Sobol, PTA 03/28/18, 10:58 AM

## 2018-03-28 NOTE — Progress Notes (Signed)
Occupational Therapy Treatment Patient Details Name: Cory Harvey MRN: [id number] DOB: 10/23/1961 Today's Date: 03/28/2018    History of present illness Patient is a 57 year old male admitted from home for a CVA following c/o R side weakness and slurred speech.  PMH includes Htn, depression, stroke, seizures, tobacco use and depression.   OT comments  Met pt sitting EOB finishing feeding self meal with hands and not utensils, spillage noted. Pt reports difficulty using dining utensils. Pt presents easily distracted asking off topic questions mid conversation, easily directable with cues. Some impulsivity noted, also easily redirectable with VC's. Pt reports some insight into deficits, stating he notices that his deficits from this stroke are more significant from previous ones. Supine <> sit completed with supervision for safety. Sit <> stand completed with min guard 2/2 to a hesitancy to shift weight in LEs (proprioception/ balance). Pt brushed teeth at sink with min guard, able to open tooth paste/apply toothpaste. Pt brushed teeth with L hand, needing cue for appropriate hand placement. Pt states "I can't do that" in reference to rinsing mouth at the sink, completed sitting EOB with cup of water. Left pt lying supine in bed, call light in place/bed alarm on.  Follow Up Recommendations  CIR    Equipment Recommendations       Recommendations for Other Services      Precautions / Restrictions Precautions Precautions: Fall       Mobility Bed Mobility Overal bed mobility: Modified Independent             General bed mobility comments: somewhat impulsive, but very redirectable with cues  Transfers Overall transfer level: Needs assistance Equipment used: 1 person hand held assist Transfers: Sit to/from Stand Sit to Stand: Min guard              Balance Overall balance assessment: Needs assistance Sitting-balance support: Feet supported Sitting balance-Leahy Scale:  Good Sitting balance - Comments: supervision needed for safety   Standing balance support: No upper extremity supported Standing balance-Leahy Scale: Poor Standing balance comment: required HHA from therapist, hesitant to shift weight                           ADL either performed or assessed with clinical judgement   ADL Overall ADL's : Needs assistance/impaired Eating/Feeding: Minimal assistance Eating/Feeding Details (indicate cue type and reason): Pt sitting bedside feeding self meal using hands, needs min A to appropriately use utensils Grooming: Standing;Cueing for safety;Min guard Grooming Details (indicate cue type and reason): Pt completed grooming standing at sink, needed min guard for balance/safety, able to open toothpaste/apply to tooth brush and brush teeth with cues for correct hand placement   Upper Body Bathing Details (indicate cue type and reason): 2/2 to cognitive deficits, safety and UE coordination deficits, anticipate pt needing min A    Lower Body Bathing Details (indicate cue type and reason): 2/2 to copromised standing balance and some impuslivity, anticipate pt needing min A   Upper Body Dressing Details (indicate cue type and reason): 2/2 to cognitive deficits, safety and UE coordination deficits, anticipate pt needing min A    Lower Body Dressing Details (indicate cue type and reason): 2/2 to copromised standing balance and some impuslivity, anticipate pt needing min A   Toilet Transfer Details (indicate cue type and reason): 2/2 to copromised standing balance and some impuslivity, anticipate pt needing min A   Toileting - Clothing Manipulation Details (indicate cue type  and reason): 2/2 to cognitive deficits, safety and UE coordination deficits, anticipate pt needing min A      Functional mobility during ADLs: Minimal assistance;Cueing for safety       Vision Baseline Vision/History: Wears glasses Wears Glasses: At all times Patient Visual  Report: No change from baseline     Perception     Praxis      Cognition Arousal/Alertness: Awake/alert Behavior During Therapy: WFL for tasks assessed/performed Overall Cognitive Status: Impaired/Different from baseline Area of Impairment: Attention;Safety/judgement;Awareness;Problem solving;Orientation                 Orientation Level: Disoriented to;Time Current Attention Level: (short attention span, needing redirection)     Safety/Judgement: Decreased awareness of safety;Decreased awareness of deficits   Problem Solving: Slow processing;Requires verbal cues;Requires tactile cues General Comments: Pt with innapropriate social questions, offering therapist a sip of drink/bite of food. Pt often repeating questions. Very easily distracted, requiring redirection. Requires increased processing time despite automatically responding "ok"        Exercises Other Exercises Other Exercises: Sitting balance testing by having pt sit EOB and reaching for therapists hands in all planes, needs one UE support Other Exercises: Visual tracking tested, no deficits noted- needed cues due to inattentiveness   Shoulder Instructions       General Comments      Pertinent Vitals/ Pain       Pain Assessment: No/denies pain  Home Living                                          Prior Functioning/Environment              Frequency  Min 3X/week        Progress Toward Goals  OT Goals(current goals can now be found in the care plan section)  Progress towards OT goals: Progressing toward goals  Acute Rehab OT Goals Patient Stated Goal: Pt will regain functional independence to engage in daily activity to increase quality of life  Plan Discharge plan remains appropriate    Co-evaluation                 AM-PAC PT "6 Clicks" Daily Activity     Outcome Measure   Help from another person eating meals?: A Little Help from another person taking care of  personal grooming?: A Little Help from another person toileting, which includes using toliet, bedpan, or urinal?: A Little Help from another person bathing (including washing, rinsing, drying)?: A Little Help from another person to put on and taking off regular upper body clothing?: A Little Help from another person to put on and taking off regular lower body clothing?: A Little 6 Click Score: 18    End of Session Equipment Utilized During Treatment: Gait belt  OT Visit Diagnosis: Other abnormalities of gait and mobility (R26.89);Apraxia (R48.2);Cognitive communication deficit (R41.841);Other symptoms and signs involving the nervous system (R29.898);Other symptoms and signs involving cognitive function;Unsteadiness on feet (R26.81)   Activity Tolerance Patient tolerated treatment well   Patient Left in bed;with call bell/phone within reach;with bed alarm set   Nurse Communication          Time: 8333-8329 OT Time Calculation (min): 32 min  Charges: OT General Charges $OT Visit: 1 Visit OT Treatments $Self Care/Home Management : 8-22 mins $Neuromuscular Re-education: 8-22 mins    Zenovia Jarred, MSOT, OTR/L  Zenovia Jarred 03/28/2018, 6:01 PM

## 2018-03-28 NOTE — Care Management Note (Signed)
Case Management Note  Patient Details  Name: Cory Harvey MRN: [id number] Date of Birth: 04/28/1961  Subjective/Objective:  Admitted to [facility] with the diagnosis of CVA. Lives alone, but brother sometimes stays in the home. Father is Reita ClicheBobby [phone]). Sees Dr. Burnadette PopLinthavong as primary care physician. [facility] [place]. No home Health. No skilled facility. No medical equipment in the home. Takes care of all basic activities of daily living himself, could drive if his car was fixed. Good appetite. Larey SeatFell prior to this admission.                   Action/Plan: Physical therapy is recommending Inpatient Acute Rehab. Discussed this plan with Cory Harvey. States that he in agreement for aggressive therapy.  [facility]. Limited support in the home after rehab.  Occupational therapy evaluation pending.   Expected Discharge Date:                  Expected Discharge Plan:     In-House Referral:     Discharge planning Services     Post Acute Care Choice:   yes Choice offered to:   patient  DME Arranged:    DME Agency:     HH Arranged:    HH Agency:     Status of Service:     If discussed at [employer] Meetings, dates discussed:    Additional Comments:  Gwenette GreetBrenda S Mieczyslaw Stamas, RN MSN CCM Care Management [phone] 03/28/2018, 8:54 AM

## 2018-03-28 NOTE — Plan of Care (Signed)
  Problem: Education: Goal: Knowledge of General Education information will improve Outcome: Progressing   Problem: Education: Goal: Knowledge of disease or condition will improve Outcome: Progressing Goal: Knowledge of secondary prevention will improve Outcome: Progressing Goal: Knowledge of patient specific risk factors addressed and post discharge goals established will improve Outcome: Progressing   Problem: Coping: Goal: Will identify appropriate support needs Outcome: Progressing   Problem: Health Behavior/Discharge Planning: Goal: Ability to manage health-related needs will improve Outcome: Progressing   Problem: Self-Care: Goal: Ability to participate in self-care as condition permits will improve Outcome: Progressing Goal: Ability to communicate needs accurately will improve Outcome: Progressing   Problem: Nutrition: Goal: Risk of aspiration will decrease Outcome: Progressing   Problem: Ischemic Stroke/TIA Tissue Perfusion: Goal: Complications of ischemic stroke/TIA will be minimized Outcome: Progressing   

## 2018-03-28 NOTE — Progress Notes (Signed)
[facility] [place] [facility]   PATIENT NAME: Cory Harvey    MR#:  [id number]  DATE OF BIRTH:  02-Nov-1961  SUBJECTIVE:  patient came in with a aphasia and right-sided weakness. Speech improving although does have some dysarthria. Right lower extremity weakness very slowly improving.   REVIEW OF SYSTEMS:   Review of Systems  Constitutional: Negative for chills, fever and weight loss.  HENT: Negative for ear discharge, ear pain and nosebleeds.   Eyes: Negative for blurred vision, pain and discharge.  Respiratory: Negative for sputum production, shortness of breath, wheezing and stridor.   Cardiovascular: Negative for chest pain, palpitations, orthopnea and PND.  Gastrointestinal: Negative for abdominal pain, diarrhea, nausea and vomiting.  Genitourinary: Negative for frequency and urgency.  Musculoskeletal: Negative for back pain and joint pain.  Neurological: Positive for speech change and weakness. Negative for sensory change and focal weakness.  Psychiatric/Behavioral: Negative for depression and hallucinations. The patient is not nervous/anxious.    Tolerating Diet:yes Tolerating PT:ciR  DRUG ALLERGIES:  No Known Allergies  VITALS:  Blood pressure 115/81, pulse (!) 58, temperature 97.8 F (36.6 C), temperature source Oral, resp. rate 18, height 5\' 8"  (1.727 m), weight 82.1 kg (181 lb), SpO2 100 %.  PHYSICAL EXAMINATION:   Physical Exam  GENERAL:  57 y.o.-year-old patient lying in the bed with no acute distress.  EYES: Pupils equal, round, reactive to light and accommodation. No scleral icterus. Extraocular muscles intact.  HEENT: Head atraumatic, normocephalic. Oropharynx and nasopharynx clear.  NECK:  Supple, no jugular venous distention. No thyroid enlargement, no tenderness.  LUNGS: Normal breath sounds bilaterally, no wheezing, rales, rhonchi. No use of accessory muscles of respiration.  CARDIOVASCULAR: S1, S2 normal. No murmurs, rubs,  or gallops.  ABDOMEN: Soft, nontender, nondistended. Bowel sounds present. No organomegaly or mass.  EXTREMITIES: No cyanosis, clubbing or edema b/l.    NEUROLOGIC: Cranial nerves II through XII are intact. No focal  sensory deficits b/l. Dysarthria PSYCHIATRIC:  patient is alert and oriented x 3.  SKIN: No obvious rash, lesion, or ulcer.   LABORATORY PANEL:  CBC Recent Labs  Lab 03/26/18 0944  WBC 6.8  HGB 16.7  HCT 50.3  PLT 440    Chemistries  Recent Labs  Lab 03/26/18 0944  NA 137  K 3.8  CL 103  CO2 25  GLUCOSE 91  BUN 16  CREATININE 1.24  CALCIUM 9.2   Cardiac Enzymes No results for input(s): TROPONINI in the last 168 hours. RADIOLOGY:  [place] Carotid Bilateral (at [facility])  Result Date: 03/27/2018 CLINICAL DATA:  57 year old male with a history of cerebrovascular accident. Cardiovascular risk factors include hypertension, stroke/TIA, tobacco use EXAM: BILATERAL CAROTID DUPLEX ULTRASOUND TECHNIQUE: Wallace CullensGray scale imaging, color Doppler and duplex ultrasound were performed of bilateral carotid and vertebral arteries in the neck. COMPARISON:  03/09/2014 FINDINGS: Criteria: Quantification of carotid stenosis is based on velocity parameters that correlate the residual internal carotid diameter with NASCET-based stenosis levels, using the diameter of the distal internal carotid lumen as the denominator for stenosis measurement. The following velocity measurements were obtained: RIGHT ICA:  Systolic 38 cm/sec, Diastolic 15 cm/sec CCA:  43 cm/sec SYSTOLIC ICA/CCA RATIO:  0.9 ECA:  52 cm/sec LEFT ICA:  Systolic 27 cm/sec, Diastolic 10 cm/sec CCA:  51 cm/sec SYSTOLIC ICA/CCA RATIO:  0.5 ECA:  59 cm/sec Right Brachial SBP: Not acquired Left Brachial SBP: Not acquired RIGHT CAROTID ARTERY: No significant calcified disease of the right common carotid  artery. Intermediate waveform maintained. Heterogeneous plaque without significant calcifications at the right carotid bifurcation. Low  resistance waveform of the right ICA. No significant tortuosity. RIGHT VERTEBRAL ARTERY: Antegrade flow with low resistance waveform. LEFT CAROTID ARTERY: No significant calcified disease of the left common carotid artery. Intermediate waveform maintained. Heterogeneous plaque at the left carotid bifurcation without significant calcifications. Low resistance waveform of the left ICA. LEFT VERTEBRAL ARTERY:  Antegrade flow with low resistance waveform. IMPRESSION: Color duplex indicates minimal heterogeneous plaque, with no hemodynamically significant stenosis by duplex criteria in the extracranial cerebrovascular circulation. Signed, Yvone Neu. Loreta Ave, DO [facility] Electronically Signed   By: Gilmer Mor D.O.   On: 03/27/2018 09:01   ASSESSMENT AND PLAN:  Cory Harvey  is a 57 y.o. male with a known history of CVA, tobacco abuse, hypertension presented to the emergency room with weakness in the right upper extremity and slurred speech.  This has been going on since Saturday.  Patient noticed slurred speech and also had difficulty with his right hand.  He also had some weakness in the right lower extremity.  1.  Acute CVA --MRI showed Multiple small acute infarcts in the right cerebral hemisphere, posterior watershed distribution. --Neurology consultation appreciated. Continue aspirin 320 50 g daily along with statins and blood pressure med -transthoracic echocardiogram within normal limits  -transesophageal echocardiogram within normal limits. No embolic source found.  -carotid ultrasound no hemodynamically significant stenosis  -continue aspirin 325 mg daily and statins Neuro check every 4 hourly for 24 hours  2.  Hypertension Continue losartan for control of blood pressure  3.  Hyperlipidemia Resume Lipitor  4.  DVT prophylaxis with subcu Lovenox 40 mg daily  5.  Tobacco cessation counseled to the patient for 6  minutes Nicotine patch offered  physical therapy occupational and speech therapy recommendations highly appreciated. Patient will benefit from acute inpatient rehab. Discharge planning in progress.  Case discussed with Care Management/Social Worker. Management plans discussed with the patient, family and they are in agreement.  CODE STATUS: full  DVT Prophylaxis: Lovenox  TOTAL TIME TAKING CARE OF THIS PATIENT: 30 minutes.  >50% time spent on counselling and coordination of care  POSSIBLE D/C IN 1 DAYS, DEPENDING ON CLINICAL CONDITION.  Note: This dictation was prepared with Dragon dictation along with smaller phrase technology. Any transcriptional errors that result from this process are unintentional.  Enedina Finner M.D on 03/28/2018 at 3:02 PM  Between 7am to 6pm - Pager - [phone]  After 6pm go to [email or web address] - Social research officer, government  [facility]  Office  [phone]  CC: Primary care physician; Marisue Ivan, MDPatient ID: Lynnda Child, male   DOB: [birth year], 67 y.o.   MRN: [id number]

## 2018-03-28 NOTE — Progress Notes (Signed)
[facility] inpatient rehab admissions - [facility].  I will update all once I hear back from insurance case manager.  Call me for questions.  [phone]

## 2018-03-29 ENCOUNTER — Other Ambulatory Visit: Payer: Self-pay

## 2018-03-29 ENCOUNTER — Inpatient Hospital Stay (HOSPITAL_COMMUNITY)
Admission: RE | Admit: 2018-03-29 | Discharge: 2018-04-04 | DRG: 057 | Disposition: A | Discharge status: Home Health | Payer: Medicare Other | Source: Other Acute Inpatient Hospital | Attending: Physical Medicine & Rehabilitation | Admitting: Physical Medicine & Rehabilitation

## 2018-03-29 ENCOUNTER — Encounter (HOSPITAL_COMMUNITY): Payer: Self-pay

## 2018-03-29 DIAGNOSIS — Z79899 Other long term (current) drug therapy: Secondary | ICD-10-CM

## 2018-03-29 DIAGNOSIS — R269 Unspecified abnormalities of gait and mobility: Secondary | ICD-10-CM | POA: Diagnosis not present

## 2018-03-29 DIAGNOSIS — Z833 Family history of diabetes mellitus: Secondary | ICD-10-CM

## 2018-03-29 DIAGNOSIS — I69320 Aphasia following cerebral infarction: Secondary | ICD-10-CM | POA: Diagnosis not present

## 2018-03-29 DIAGNOSIS — Z7902 Long term (current) use of antithrombotics/antiplatelets: Secondary | ICD-10-CM

## 2018-03-29 DIAGNOSIS — Z8249 Family history of ischemic heart disease and other diseases of the circulatory system: Secondary | ICD-10-CM

## 2018-03-29 DIAGNOSIS — Z23 Encounter for immunization: Secondary | ICD-10-CM | POA: Diagnosis present

## 2018-03-29 DIAGNOSIS — R209 Unspecified disturbances of skin sensation: Secondary | ICD-10-CM | POA: Diagnosis not present

## 2018-03-29 DIAGNOSIS — Z823 Family history of stroke: Secondary | ICD-10-CM

## 2018-03-29 DIAGNOSIS — I69351 Hemiplegia and hemiparesis following cerebral infarction affecting right dominant side: Principal | ICD-10-CM

## 2018-03-29 DIAGNOSIS — F17210 Nicotine dependence, cigarettes, uncomplicated: Secondary | ICD-10-CM | POA: Diagnosis present

## 2018-03-29 DIAGNOSIS — Z7982 Long term (current) use of aspirin: Secondary | ICD-10-CM | POA: Diagnosis not present

## 2018-03-29 DIAGNOSIS — I10 Essential (primary) hypertension: Secondary | ICD-10-CM | POA: Diagnosis present

## 2018-03-29 DIAGNOSIS — G8191 Hemiplegia, unspecified affecting right dominant side: Secondary | ICD-10-CM | POA: Diagnosis not present

## 2018-03-29 DIAGNOSIS — I69398 Other sequelae of cerebral infarction: Secondary | ICD-10-CM | POA: Diagnosis not present

## 2018-03-29 DIAGNOSIS — I69319 Unspecified symptoms and signs involving cognitive functions following cerebral infarction: Secondary | ICD-10-CM | POA: Diagnosis not present

## 2018-03-29 DIAGNOSIS — E785 Hyperlipidemia, unspecified: Secondary | ICD-10-CM | POA: Diagnosis present

## 2018-03-29 DIAGNOSIS — I639 Cerebral infarction, unspecified: Secondary | ICD-10-CM | POA: Diagnosis not present

## 2018-03-29 LAB — CBC
HCT: 48.1 % (ref 39.0–52.0)
Hemoglobin: 16 g/dL (ref 13.0–17.0)
MCH: 28.5 pg (ref 26.0–34.0)
MCHC: 33.3 g/dL (ref 30.0–36.0)
MCV: 85.7 fL (ref 78.0–100.0)
PLATELETS: 447 10*3/uL — AB (ref 150–400)
RBC: 5.61 MIL/uL (ref 4.22–5.81)
RDW: 15.1 % (ref 11.5–15.5)
WBC: 6.9 10*3/uL (ref 4.0–10.5)

## 2018-03-29 LAB — CREATININE, SERUM
CREATININE: 1.3 mg/dL — AB (ref 0.61–1.24)
GFR calc non Af Amer: 59 mL/min — ABNORMAL LOW (ref >60)

## 2018-03-29 MED ORDER — PNEUMOCOCCAL VAC POLYVALENT 25 MCG/0.5ML IJ INJ
0.5000 mL | INJECTION | INTRAMUSCULAR | Status: AC
  Administered 2018-03-30: 0.5 mL via INTRAMUSCULAR
  Filled 2018-03-29: qty 0.5

## 2018-03-29 MED ORDER — ASPIRIN 300 MG RE SUPP
300.0000 mg | Freq: Every day | RECTAL | Status: DC
  Filled 2018-03-29 (×3): qty 1

## 2018-03-29 MED ORDER — ACETAMINOPHEN 160 MG/5ML PO SOLN: 650.0000 mg | ORAL | Status: DC | PRN

## 2018-03-29 MED ORDER — ONDANSETRON HCL 4 MG PO TABS: 4.0000 mg | ORAL_TABLET | Freq: Four times a day (QID) | ORAL | Status: DC | PRN

## 2018-03-29 MED ORDER — CLOPIDOGREL BISULFATE 75 MG PO TABS
75.0000 mg | ORAL_TABLET | Freq: Every day | ORAL | Status: DC
  Administered 2018-03-30 – 2018-04-04 (×6): 75 mg via ORAL
  Filled 2018-03-29 (×6): qty 1

## 2018-03-29 MED ORDER — ACETAMINOPHEN 650 MG RE SUPP: 650.0000 mg | RECTAL | Status: DC | PRN

## 2018-03-29 MED ORDER — SORBITOL 70 % SOLN
30.0000 mL | Freq: Every day | Status: DC | PRN
  Administered 2018-04-03: 30 mL via ORAL
  Filled 2018-03-29: qty 30

## 2018-03-29 MED ORDER — ATORVASTATIN CALCIUM 20 MG PO TABS
20.0000 mg | ORAL_TABLET | Freq: Every day | ORAL | Status: DC
  Administered 2018-03-29 – 2018-04-03 (×6): 20 mg via ORAL
  Filled 2018-03-29 (×6): qty 1

## 2018-03-29 MED ORDER — ENOXAPARIN SODIUM 40 MG/0.4ML ~~LOC~~ SOLN: 40.0000 mg | SUBCUTANEOUS | Status: DC

## 2018-03-29 MED ORDER — ASPIRIN EC 325 MG PO TBEC
325.0000 mg | DELAYED_RELEASE_TABLET | Freq: Every day | ORAL | Status: DC
  Administered 2018-03-30 – 2018-04-04 (×6): 325 mg via ORAL
  Filled 2018-03-29 (×6): qty 1

## 2018-03-29 MED ORDER — ONDANSETRON HCL 4 MG/2ML IJ SOLN: 4.0000 mg | Freq: Four times a day (QID) | INTRAMUSCULAR | Status: DC | PRN

## 2018-03-29 MED ORDER — ENOXAPARIN SODIUM 40 MG/0.4ML ~~LOC~~ SOLN
40.0000 mg | SUBCUTANEOUS | Status: DC
  Administered 2018-03-29 – 2018-04-03 (×6): 40 mg via SUBCUTANEOUS
  Filled 2018-03-29 (×6): qty 0.4

## 2018-03-29 MED ORDER — ACETAMINOPHEN 325 MG PO TABS: 650.0000 mg | ORAL_TABLET | ORAL | Status: DC | PRN

## 2018-03-29 MED ORDER — LOSARTAN POTASSIUM 50 MG PO TABS
25.0000 mg | ORAL_TABLET | Freq: Every day | ORAL | Status: DC
  Administered 2018-03-29 – 2018-03-31 (×3): 25 mg via ORAL
  Filled 2018-03-29 (×4): qty 1

## 2018-03-29 NOTE — Progress Notes (Signed)
[facility] called  To transport pt to [facility]. Awaiting transport

## 2018-03-29 NOTE — Care Management Important Message (Signed)
Important Message  Patient Details  Name: Cory Harvey MRN: [id number] Date of Birth: August 22, 1961   Medicare Important Message Given:  Yes    [name] 03/29/2018, 11:34 AM

## 2018-03-29 NOTE — Progress Notes (Signed)
Spoke with Blane Oharaaroline  rn on phone from [place] to  Give report on pt going to [address].  Pt has no iv site.  Alert/ no distress.  Will call [facility] for transport.

## 2018-03-29 NOTE — PMR Pre-admission (Signed)
[facility] PMR Admission Coordinator Pre-Admission Assessment  Patient: Cory Harvey is an 57 y.o., male MRN: [id number] DOB: [birth year] Height: _0  (172.7 cm) Weight: 82.1 kg (181 lb)  Insurance Information HMO:      PPO:       PCP:       IPA:       80/20:       OTHER:  Group # [id number] PRIMARY: [place] [employer]      Policy#: [id number]      Subscriber: patient CM Name:  [name]      Phone#: [phone]     Fax#: [phone] Pre-Cert#: [id number] with update due in 7 days      Employer:   Benefits:  Phone #: [phone]     Name:  On line Eff. Date: 12/11/17     Deduct:  $0      Out of Pocket Max: [phone] (met $0)      Life Max: N/A CIR: $430 days 1-4      SNF:  $0 days 1-20; $160 days 21-62; $0 days 63-100 Outpatient: medical necessity     Co-Pay: $40/visit Home Health: 100%      Co-Pay: none DME: 80%     Co-Pay: 20% Providers: in network  Medicaid Application Date:        Case Manager:   Disability Application Date:        Case Worker:    Emergency Facilities manager Information    Name Relation Home Work Mobile   [name] Father [phone]        Current Medical History  Patient Admitting Diagnosis: L CVA  History of Present Illness: A 57 year old right-handed male with history of CVA x4 maintained on aspirin 81 mg daily and Plavix, tobacco abuse, hypertension.  Presented to [facility] 03/26/2018 with right-sided weakness and slurred speech.  Per chart review patient lives alone.  Reported to be independent prior to admission and driving.  One level home.  He has a brother as well as a father in the area that check on him regularly.  Cranial CT scan negative.  Patient did not receive TPA.  MRI showed multiple small acute infarcts in the right cerebral hemisphere, posterior watershed distribution.  Advanced chronic small vessel disease with multiple remote lacunar infarctions.  Carotid Dopplers with no ICA stenosis.  Echocardiogram with ejection fraction of  65%.  Systolic function was normal.  TEE 03/27/2018 showing no vegetation without thrombus.  No defect or PFO.  Presently on aspirin 325 mg daily as well as Plavix for CVA prophylaxis.  Subcu Lovenox for DVT prophylaxis.  Tolerating a regular diet.  Physical and occupational therapy evaluations completed.  Patient to be admitted for a comprehensive inpatient rehab program.  Patient[facility] medical record from [facility] has been reviewed by the rehabilitation admission coordinator and physician.  NIH Stroke scale:  Past Medical History  Past Medical History:  Diagnosis Date  . Depression   . History of CVA (cerebrovascular accident)   . Hypertension   . Stroke ([place])    x 4  . Tobacco abuse     Family History   family history includes Diabetes in his father and mother; Heart disease in his mother; Hyperlipidemia in his mother; Hypertension in his father and mother; Stroke in his mother.  Prior Rehab/Hospitalizations Has the patient had major surgery during 100 days prior to admission? No    Current Medications See current MAR from [facility]  Patients Current Diet:   Heart diet, thin liquids  Precautions / Restrictions Precautions Precautions: Fall Restrictions Weight Bearing Restrictions: No   Has the patient had 2 or more falls or a fall with injury in the past year?No  Prior Activity Level Community (5-7x/wk): Went out daily.  Was driving.  Prior Functional Level Self Care: Did the patient need help bathing, dressing, using the toilet or eating?  Independent  Indoor Mobility: Did the patient need assistance with walking from room to room (with or without device)? Independent  Stairs: Did the patient need assistance with internal or external stairs (with or without device)? Independent  Functional Cognition: Did the patient need help planning regular tasks such as shopping or remembering to take medications? Independent  Home  Assistive Devices / Equipment Home Assistive Devices/Equipment: None Home Equipment: Walker - 2 wheels, Other (comment)(has not previously used equipment, spare from family member)  Prior Device Use: Indicate devices/aids used by the patient prior to current illness, exacerbation or injury? None, but has access to a walker, not using it.   Prior Functional Level Current Functional Level  Bed Mobility  Independent  Mod Independent   Transfers  Independent  Min assist(min guard assist)   Mobility - Walk/Wheelchair  Independent  Min assist(min to min guard 160 feet RW)   Upper Body Dressing  Independent  Min assist   Lower Body Dressing  Independent  Min assist   Grooming  Independent  Min assist(min guard assist)   Eating/Drinking  Independent  Min assist   Toilet Transfer  Independent  Min assist   Bladder Continence   WDL  WDL   Bowel Management  WDL  Last BM 03/29/18   Stair Climbing     Other(Not tried)   Consulting civil engineer  Slurred speech   Memory  WDL  Mild impairment   Cooking/Meal Prep  Community education officer  Independent    Driving         Special needs/care consideration BiPAP/CPAP No CPM No Continuous Drip IV No Dialysis No       Life Vest No Oxygen No Special Bed No Trach Size No Wound Vac (area) No     Skin No                             Bowel mgmt: Last BM 03/29/18 Bladder mgmt: WDL Diabetic mgmt No  Previous Home Environment Living Arrangements: Alone  Lives With: Alone Available Help at Discharge: Available PRN/intermittently, Other (Comment)(father, but states father is elderly/frail) Type of Home: House Home Layout: One level Home Access: Stairs to enter Entrance Stairs-Rails: Can reach both Entrance Stairs-Number of Steps: 6 Bathroom Shower/Tub: Multimedia programmer: [place]: No  Discharge Living Setting Plans for Discharge Living  Setting: Alone, House(Plans to return home alone with help from father/brother.) Type of Home at Discharge: House Discharge Home Layout: One level Discharge Home Access: Stairs to enter Entrance Stairs-Number of Steps: 6 steps Does the patient have any problems obtaining your medications?: No  Social/Family/Support Systems Patient Roles: Other (Comment)(Has an elderly father and a brother.) Contact Information: Shanta Dorvil - father - [phone] Anticipated Caregiver: Self, father, brother Ability/Limitations of Caregiver: Father is frail and elderly.  Brother has stayed with patient previously Caregiver Availability: Intermittent Discharge Plan Discussed with Primary Caregiver: Yes Is Caregiver In Agreement with Plan?: Yes Does Caregiver/Family have  Issues with Lodging/Transportation while Pt is in Rehab?: No  Goals/Additional Needs Patient/Family Goal for Rehab: PT/OT/SLP mod I and supervision goals Expected length of stay: 10-14 days Cultural Considerations: None Dietary Needs: Heart diet, thin liquids Equipment Needs: TBD Pt/Family Agrees to Admission and willing to participate: Yes Program Orientation Provided & Reviewed with Pt/Caregiver Including Roles  & Responsibilities: Yes  Patient Condition: I have reviewed all progress and I have spoken with the patient.  He suffered a L CVA.  He was independent PTA.  He is currently receiving PT/OT/SLP in the acute hospital setting.  He can tolerate and benefit from 3 hours of therapy a day.  I have shared all information and reviewed with rehab MD and I have approval for acute inpatient rehab admission for today.  Preadmission Screen Completed By:  Retta Diones, 03/29/2018 11:10 AM ______________________________________________________________________   Discussed status with Dr. Naaman Plummer on 03/29/18 at 34 and received telephone approval for admission today.  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 Does the need for close, 24 hr/day  Medical supervision in concert with the patient's rehab needs make it unreasonable for this patient to be served in a less intensive setting? Yes 2. Co-Morbidities requiring supervision/potential complications: HTN, post-stroke sequelae 3. Due to bladder management, bowel management, safety, skin/wound care, disease management, medication administration, pain management and patient education, does the patient require 24 hr/day rehab nursing? Yes 4. Does the patient require coordinated care of a physician, rehab nurse, PT (1-2 hrs/day, 5 days/week), OT (1-2 hrs/day, 5 days/week) and SLP (1-2 hrs/day, 5 days/week) to address physical and functional deficits in the context of the above medical diagnosis(es)? Yes Addressing deficits in the following areas: balance, endurance, locomotion, strength, transferring, bowel/bladder control, bathing, dressing, feeding, grooming, toileting, cognition and psychosocial support 5. Can the patient actively participate in an intensive therapy program of at least 3 hrs of therapy 5 days a week? Yes 6. The potential for patient to make measurable gains while on inpatient rehab is excellent 7. Anticipated functional outcomes upon discharge from inpatients are: modified independent and supervision PT, modified independent and supervision OT, modified independent and supervision SLP 8. Estimated rehab length of stay to reach the above functional goals is: 10-14 days 9. Does the patient have adequate social supports to accommodate these discharge functional goals? Yes 10. Anticipated D/C setting: Home 11. Anticipated post D/C treatments: [place] therapy 12. Overall Rehab/Functional Prognosis: excellent    RECOMMENDATIONS: This patient's condition is appropriate for continued rehabilitative care in the following setting: CIR Patient has  agreed to participate in recommended program. Yes Note that insurance prior authorization may be required for reimbursement for recommended care.    Comment: Admit to inpatient rehab today.   Meredith Staggers, MD, [facility] 03/29/2018   Retta Diones 03/29/2018

## 2018-03-29 NOTE — Discharge Summary (Signed)
[facility] [place] [facility]   PATIENT NAME: Cory Harvey    MR#:  [id number]  DATE OF BIRTH:  12/11/1961  DATE OF ADMISSION:  03/26/2018 ADMITTING PHYSICIAN: Ihor Austin, MD  DATE OF DISCHARGE: 03/29/2018  PRIMARY CARE PHYSICIAN: Marisue Ivan, MD   ADMISSION DIAGNOSIS:  CVA (cerebral vascular accident) [facility]) [I63.9] Cerebrovascular accident (CVA), unspecified mechanism (HCC) [I63.9]  DISCHARGE DIAGNOSIS:  Active Problems:   CVA (cerebral vascular accident) (HCC)   SECONDARY DIAGNOSIS:   Past Medical History:  Diagnosis Date  . Depression   . History of CVA (cerebrovascular accident)   . Hypertension   . Stroke (HCC)    x 4  . Tobacco abuse     ADMITTING HISTORY  HISTORY OF PRESENT ILLNESS: Cory Harvey  is a 57 y.o. male with a known history of CVA, tobacco abuse, hypertension presented to the emergency room with weakness in the right upper extremity and slurred speech.  This has been going on since Saturday.  Patient noticed slurred speech and also had difficulty with his right hand.  He also had some weakness in the right lower extremity.  He was evaluated in the emergency room with MRI brain which showed small acute infarcts in the right cerebral hemisphere.  Patient was started on aspirin.  Hospitalist service was consulted.  No complaints of any chest pain, shortness of breath.  Patient also says he had a fall but no head injury.  Hospitalist service was consulted for further care.  HOSPITAL COURSE:   1.Acute CVA --MRI showed Multiple small acute infarcts in the right cerebral hemisphere, posterior watershed distribution. --Neurology consultation appreciated. Continue aspirin 325 g daily along with statins and Plavix -transthoracic echocardiogram within normal limits  -transesophageal echocardiogram within normal limits. No embolic source found.  -carotid ultrasound no hemodynamically significant stenosis -continue aspirin , Plavix  statins  2.Hypertension Losartan stopped. Can restart 25 mg daily if trending up  3.Hyperlipidemia Resume Lipitor  4.DVT prophylaxis with subcu Lovenox 40 mg daily in hospital stay  5.Tobacco cessation counseled  Stable for discharge to IPR  CONSULTS OBTAINED:  Treatment Team:  Thana Farr, MD  DRUG ALLERGIES:  No Known Allergies  DISCHARGE MEDICATIONS:   Allergies as of 03/29/2018   No Known Allergies     Medication List    STOP taking these medications   losartan 50 MG tablet Commonly known as:  COZAAR     TAKE these medications   aspirin EC 81 MG tablet Take 1 tablet (81 mg total) by mouth daily.   atorvastatin 10 MG tablet Commonly known as:  LIPITOR Take 2 tablets (20 mg total) by mouth daily at 6 PM.   clopidogrel 75 MG tablet Commonly known as:  PLAVIX Take 1 tablet (75 mg total) by mouth daily.       Today   VITAL SIGNS:  Blood pressure 110/77, pulse 61, temperature 98.3 F (36.8 C), temperature source Oral, resp. rate 20, height 5\' 8"  (1.727 m), weight 82.1 kg (181 lb), SpO2 100 %.  I/O:    Intake/Output Summary (Last 24 hours) at 03/29/2018 1019 Last data filed at 03/29/2018 0236 Gross per 24 hour  Intake 480 ml  Output 500 ml  Net -20 ml    PHYSICAL EXAMINATION:  Physical Exam  GENERAL:  57 y.o.-year-old patient lying in the bed with no acute distress.  LUNGS: Normal breath sounds bilaterally, no wheezing, rales,rhonchi or crepitation. No use of accessory muscles of respiration.  CARDIOVASCULAR: S1, S2 normal.  No murmurs, rubs, or gallops.  ABDOMEN: Soft, non-tender, non-distended. Bowel sounds present. No organomegaly or mass.  NEUROLOGIC: Moves all 4 extremities. PSYCHIATRIC: The patient is alert and oriented x 3. Dysarthria. Ataxia SKIN: No obvious rash, lesion, or ulcer.   DATA REVIEW:   CBC Recent Labs  Lab 03/26/18 0944  WBC 6.8  HGB 16.7  HCT 50.3  PLT 440    Chemistries  Recent Labs  Lab  03/26/18 0944  NA 137  K 3.8  CL 103  CO2 25  GLUCOSE 91  BUN 16  CREATININE 1.24  CALCIUM 9.2    Cardiac Enzymes No results for input(s): TROPONINI in the last 168 hours.  Microbiology Results  No results found for this or any previous visit.  RADIOLOGY:  No results found.  Follow up with PCP in 1 week.  Management plans discussed with the patient, family and they are in agreement.  CODE STATUS:     Code Status Orders  (From admission, onward)        Start     Ordered   03/26/18 1845  Full code  Continuous     03/26/18 1844    Code Status History    Date Active Date Inactive Code Status Order ID Comments User Context   09/02/2017 1936 09/04/2017 1722 Full Code [id number]  Marguarite ArbourSparks, Jeffrey D, MD Inpatient      TOTAL TIME TAKING CARE OF THIS PATIENT ON DAY OF DISCHARGE: more than 30 minutes.   Cory Harvey M.D on 03/29/2018 at 10:19 AM  Between 7am to 6pm - Pager - [phone]  After 6pm go to [email or web address] - password [email or web address] [facility]  [facility] [place] [facility]  Office  [phone]  CC: Primary care physician; Marisue IvanLinthavong, [place], MD  Note: This dictation was prepared with Dragon dictation along with smaller phrase technology. Any transcriptional errors that result from this process are unintentional.

## 2018-03-29 NOTE — Care Management (Signed)
Spoke with Peggye FothergillGenie Logue,RN representative [facility]. The department can accept [name] today. Mr Paris LoreWiley updated Dr Elpidio AnisSudini  updated. Nurse Baltazar NajjarWanette updated. Gwenette GreetBrenda S Amadou Katzenstein RN MSN CCM Care management  [phone]

## 2018-03-30 ENCOUNTER — Inpatient Hospital Stay (HOSPITAL_COMMUNITY): Payer: Medicare Other | Admitting: Speech Pathology

## 2018-03-30 ENCOUNTER — Inpatient Hospital Stay (HOSPITAL_COMMUNITY): Payer: Medicare Other | Admitting: Physical Therapy

## 2018-03-30 ENCOUNTER — Inpatient Hospital Stay (HOSPITAL_COMMUNITY): Payer: Medicare Other | Admitting: Occupational Therapy

## 2018-03-30 DIAGNOSIS — I69398 Other sequelae of cerebral infarction: Secondary | ICD-10-CM

## 2018-03-30 DIAGNOSIS — R209 Unspecified disturbances of skin sensation: Secondary | ICD-10-CM

## 2018-03-30 DIAGNOSIS — I639 Cerebral infarction, unspecified: Secondary | ICD-10-CM

## 2018-03-30 DIAGNOSIS — I69319 Unspecified symptoms and signs involving cognitive functions following cerebral infarction: Secondary | ICD-10-CM

## 2018-03-30 DIAGNOSIS — R269 Unspecified abnormalities of gait and mobility: Secondary | ICD-10-CM

## 2018-03-30 DIAGNOSIS — G8191 Hemiplegia, unspecified affecting right dominant side: Secondary | ICD-10-CM

## 2018-03-30 MED ORDER — GABAPENTIN 100 MG PO CAPS
100.0000 mg | ORAL_CAPSULE | Freq: Every day | ORAL | Status: DC
  Administered 2018-03-30 – 2018-04-03 (×5): 100 mg via ORAL
  Filled 2018-03-30 (×5): qty 1

## 2018-03-30 NOTE — H&P (Addendum)
Physical Medicine and Rehabilitation Admission H&P        Chief Complaint  Patient presents with  . Hip Pain  . Aphasia  . Hand Problem  : HPI: Mr. Cory Harvey is a 57 year old right-handed male with history of CVA x4 maintained on aspirin 81 mg daily and Plavix, tobacco abuse, hypertension.  Presented to [facility] 03/26/2018 with right-sided weakness and slurred speech.  Per chart review patient lives alone.  Reported to be independent prior to admission and driving.  One level home.  He has a brother as well as a father in the area that check on him regularly.  Cranial CT scan negative.  Patient did not receive TPA.  MRI showed multiple small acute infarcts in the right cerebral hemisphere, posterior watershed distribution.  Advanced chronic small vessel disease with multiple remote lacunar infarctions.  Carotid Dopplers with no ICA stenosis.  Echocardiogram with ejection fraction of 65%.  Systolic function was normal.  TEE 03/27/2018 showing no vegetation without thrombus.  No defect or PFO.  Presently on aspirin 325 mg daily as well as Plavix for CVA prophylaxis.  Subcu Lovenox for DVT prophylaxis.  Tolerating a regular diet.  Physical and occupational therapy evaluations completed.  Patient was admitted for a comprehensive rehab program   Review of Systems  Constitutional: Negative for chills and fever.  HENT: Negative for hearing loss.   Eyes: Negative for blurred vision and double vision.  Respiratory: Negative for cough and shortness of breath.   Cardiovascular: Negative for chest pain, palpitations and leg swelling.  Gastrointestinal: Positive for constipation. Negative for nausea and vomiting.  Genitourinary: Negative for dysuria, flank pain and hematuria.  Skin: Negative for rash.  Neurological: Positive for speech change and focal weakness.  Psychiatric/Behavioral: Positive for depression.  All other systems reviewed and are negative.       Past Medical History:  Diagnosis Date  .  Depression    . History of CVA (cerebrovascular accident)    . Hypertension    . Stroke (HCC)      x 4  . Tobacco abuse           Past Surgical History:  Procedure Laterality Date  . kidney surgery Left 1983  . TEE WITHOUT CARDIOVERSION N/A 03/27/2018    Procedure: TRANSESOPHAGEAL ECHOCARDIOGRAM (TEE);  Surgeon: Dalia Heading, MD;  Location: [facility] ORS;  Service: Cardiovascular;  Laterality: N/A;         Family History  Problem Relation Age of Onset  . Diabetes Mother    . Heart disease Mother    . Hyperlipidemia Mother    . Hypertension Mother    . Stroke Mother    . Diabetes Father    . Hypertension Father    . Cancer Neg Hx      Social History:  reports that he has been smoking cigarettes.  He has been smoking about 0.50 packs per day. He has never used smokeless tobacco. He reports that he drinks alcohol. He reports that he does not use drugs. Allergies: No Known Allergies       Medications Prior to Admission  Medication Sig Dispense Refill  . atorvastatin (LIPITOR) 10 MG tablet Take 2 tablets (20 mg total) by mouth daily at 6 PM. 30 tablet 0  . losartan (COZAAR) 50 MG tablet Take 1 tablet (50 mg total) by mouth daily. 30 tablet 0  . aspirin EC 81 MG tablet Take 1 tablet (81 mg total) by mouth daily. (Patient not taking: Reported  on 03/27/2018) 150 tablet 2  . clopidogrel (PLAVIX) 75 MG tablet Take 1 tablet (75 mg total) by mouth daily. (Patient not taking: Reported on 03/27/2018) 30 tablet 0      Drug Regimen Review Drug regimen was reviewed and remains appropriate with no significant issues identified   Home: Home Living Family/patient expects to be discharged to:: Private residence Living Arrangements: Alone Available Help at Discharge: Available PRN/intermittently, Other (Comment)(father, but states father is elderly/frail) Type of Home: House Home Access: Stairs to enter [employer] of Steps: 6 Entrance Stairs-Rails: Can reach both Home Layout: One  level Bathroom Shower/Tub: Health visitor: Standard Home Equipment: Environmental consultant - 2 wheels, Other (comment)(has not previously used equipment, spare from family member)  Lives With: Alone   Functional History: Prior Function Level of Independence: Independent Comments: Pt was previously driving and independent in daily routines   Functional Status:  Mobility: Bed Mobility Overal bed mobility: Modified Independent General bed mobility comments: impulsive, uses rail Transfers Overall transfer level: Needs assistance Equipment used: Rolling walker (2 wheeled) Transfers: Sit to/from Stand Sit to Stand: Min guard General transfer comment: 5 x sit to stand with mod verbal cues each attempt for hand placement.   Ambulation/Gait Ambulation/Gait assistance: Min guard, Min assist Ambulation Distance (Feet): 160 Feet Assistive device: Rolling walker (2 wheeled) General Gait Details: Pt hesitant to walk stating he feels weaker today but overall improved distance.  Poor awareness of obstacels in room and often takes hands off walker and tries to walk without it especially during turns. Gait velocity: decreased Gait velocity interpretation: <1.8 ft/sec, indicate of risk for recurrent falls   ADL: ADL Overall ADL's : Needs assistance/impaired Eating/Feeding: Minimal assistance Eating/Feeding Details (indicate cue type and reason): Pt reports difficulty manipulating utensils and needing to use hands to eat foods requiring utensils (ex mashed potatoes) Grooming: Modified independent Upper Body Bathing Details (indicate cue type and reason): 2/2 to cognitive deficits, safety and UE coordination deficits, anticipate pt needing min A  Lower Body Bathing Details (indicate cue type and reason): 2/2 to cognitive deficits, safety and UE coordination deficits, anticipate pt needing min A  Upper Body Dressing Details (indicate cue type and reason): 2/2 to cognitive deficits, safety and UE  coordination deficits, anticipate pt needing min A  Lower Body Dressing Details (indicate cue type and reason): 2/2 to cognitive deficits, safety and UE coordination deficits, anticipate pt needing min A  Toilet Transfer Details (indicate cue type and reason): 2/2 to cognitive deficits, safety and UE coordination deficits, anticipate pt needing min A  Toileting - Clothing Manipulation Details (indicate cue type and reason): 2/2 to cognitive deficits, safety and UE coordination deficits, anticipate pt needing min A  Functional mobility during ADLs: Minimal assistance, Cueing for safety General ADL Comments: Overall pt presents with decreased safety awareness/insight to deficits, and UE coordination deficits requiring min A to complete ADL routines   Cognition: Cognition Overall Cognitive Status: Impaired/Different from baseline Arousal/Alertness: Awake/alert Orientation Level: Oriented to person, Oriented to place, Oriented to situation Attention: Focused, Sustained, Alternating Focused Attention: Impaired Focused Attention Impairment: Verbal basic, Functional basic(WFL; not complex) Sustained Attention: Impaired Sustained Attention Impairment: Verbal basic, Functional basic(WFL; not complex) Alternating Attention: Impaired Alternating Attention Impairment: Verbal basic, Functional basic(grossly WFL; not complex) Memory: Impaired Memory Impairment: Decreased recall of new information(delayed recall of info) Awareness: Impaired Problem Solving: Impaired Problem Solving Impairment: (could not problem solve finding a phone #) Executive Function: Reasoning Reasoning: Impaired Reasoning Impairment: Verbal basic, Functional basic(grossly  WFL; not complex) Behaviors: Restless(min; often laughed aloud) Safety/Judgment: Appears intact(grossly WFL) Cognition Arousal/Alertness: Awake/alert Behavior During Therapy: WFL for tasks assessed/performed Overall Cognitive Status: Impaired/Different from  baseline Area of Impairment: Safety/judgement, Problem solving, Memory, Awareness Safety/Judgement: Decreased awareness of safety, Decreased awareness of deficits Problem Solving: Slow processing, Requires verbal cues, Requires tactile cues General Comments: Pt asked orientation questions, replied "Ardyth Harps" as president. When reoriented to current present, pt laughed and confused with misunderstanding.    Physical Exam: Blood pressure 115/81, pulse (!) 58, temperature 97.8 F (36.6 C), temperature source Oral, resp. rate 18, height 5\' 8"  (1.727 m), weight 82.1 kg (181 lb), SpO2 100 %. Physical Exam  Vitals reviewed. Constitutional: He appears well-developed.  HENT:  Head: Normocephalic.  Eyes: EOM are normal. Right eye exhibits no discharge. Left eye exhibits no discharge.  Neck: Normal range of motion. Neck supple. No thyromegaly present.  Cardiovascular: Normal rate, regular rhythm and normal heart sounds.  Respiratory: Effort normal and breath sounds normal. No respiratory distress.  GI: Soft. Bowel sounds are normal. He exhibits no distension.  Skin.  Warm and dry Neurological.  Patient makes good eye contact with examiner.  Speech is a bit dysarthric and he stutters just a bit.  Fully intelligible.  Some delay in recall improved with cueing.  He provides his name and age.  Follows simple commands  motor 4+ left sdie 5/5 on RIght side Sensation reduced in Right hand to LT   Lab Results Last 48 Hours        Results for orders placed or performed during the hospital encounter of 03/26/18 (from the past 48 hour(s))  Urinalysis, Complete w Microscopic     Status: Abnormal    Collection Time: 03/26/18  1:16 PM  Result Value Ref Range    Color, Urine YELLOW (A) YELLOW    APPearance CLEAR (A) CLEAR    Specific Gravity, Urine 1.025 1.005 - 1.030    pH 5.0 5.0 - 8.0    Glucose, UA NEGATIVE NEGATIVE mg/dL    Hgb urine dipstick NEGATIVE NEGATIVE    Bilirubin Urine NEGATIVE NEGATIVE     Ketones, ur 20 (A) NEGATIVE mg/dL    Protein, ur NEGATIVE NEGATIVE mg/dL    Nitrite NEGATIVE NEGATIVE    Leukocytes, UA NEGATIVE NEGATIVE    RBC / HPF 0-5 0 - 5 RBC/hpf    WBC, UA 0-5 0 - 5 WBC/hpf    Bacteria, UA NONE SEEN NONE SEEN    Squamous Epithelial / LPF 0-5 (A) NONE SEEN    Mucus PRESENT        Comment: Performed at [facility], [address] [place], [place] [address]  Hemoglobin A1c     Status: None    Collection Time: 03/27/18  6:47 AM  Result Value Ref Range    Hgb A1c MFr Bld 5.5 4.8 - 5.6 %      Comment: (NOTE) Pre diabetes:          5.7%-6.4% Diabetes:              >6.4% Glycemic control for   <7.0% adults with diabetes      Mean Plasma Glucose 111.15 mg/dL      Comment: Performed at [facility], [address], [place], [place] [address]  Lipid panel     Status: None    Collection Time: 03/27/18  6:47 AM  Result Value Ref Range    Cholesterol 132 0 - 200 mg/dL  Triglycerides 49 <150 mg/dL    HDL 49 >16 mg/dL    Total CHOL/HDL Ratio 2.7 RATIO    VLDL 10 0 - 40 mg/dL    LDL Cholesterol 73 0 - 99 mg/dL      Comment:        Total Cholesterol/HDL:CHD Risk Coronary Heart Disease Risk Table                     Men   Women  1/2 Average Risk   3.4   3.3  Average Risk       5.0   4.4  2 X Average Risk   9.6   7.1  3 X Average Risk  23.4   11.0        Use the calculated Patient Ratio above and the CHD Risk Table to determine the patient's CHD Risk.        ATP III CLASSIFICATION (LDL):  <100     mg/dL   Optimal  109-604  mg/dL   Near or Above                    Optimal  130-159  mg/dL   Borderline  540-981  mg/dL   High  >191     mg/dL   Very High Performed at [facility], [address] [place], [place] [address]         Imaging Results (Last 48 hours)  [name]   Result Date: 03/26/2018 CLINICAL DATA:  Hand pain and slurred speech. Symptoms started over the weekend, and progressed. EXAM: MRI HEAD  WITHOUT CONTRAST TECHNIQUE: Multiplanar, multiecho pulse sequences of the brain and surrounding structures were obtained without intravenous contrast. COMPARISON:  09/02/2017 FINDINGS: Brain: Numerous clustered small foci of restricted diffusion along the high right frontal and parietal convexities nearly reaching the occipital lobe, both cortical and subcortical. There is also parasagittal right parietal cortex involvement and involvement of the right splenium of the corpus callosum. Chronic small vessel ischemia with extensive white matter gliosis and numerous remote lacunar infarcts including in the bilateral deep gray nuclei, right pons, and bilateral deep white matter tracts. There are multiple signal abnormalities along the thin corpus callosum body. Although an atypical location for microvascular insults there is no history of demyelinating disease or trauma. There is however a strong vascular medical history. No acute hemorrhage. No hydrocephalus or masslike finding. Vascular: Major flow voids are preserved. Intracranial vessels are tortuous, correlating with hypertension history. Skull and upper cervical spine: Negative for marrow lesion. Cervical degenerative disc disease. Sinuses/Orbits: Negative IMPRESSION: 1. Multiple small acute infarcts in the right cerebral hemisphere, posterior watershed distribution. 2. Advanced chronic small vessel disease with multiple remote lacunar infarcts. Electronically Signed   By: Marnee Spring M.D.   On: 03/26/2018 14:58    US Carotid Bilateral (at [facility] Only)   Result Date: 03/27/2018 CLINICAL DATA:  57 year old male with a history of cerebrovascular accident. Cardiovascular risk factors include hypertension, stroke/TIA, tobacco use EXAM: BILATERAL CAROTID DUPLEX ULTRASOUND TECHNIQUE: Wallace Cullens scale imaging, color Doppler and duplex ultrasound were performed of bilateral carotid and vertebral arteries in the neck. COMPARISON:  03/09/2014 FINDINGS: Criteria:  Quantification of carotid stenosis is based on velocity parameters that correlate the residual internal carotid diameter with NASCET-based stenosis levels, using the diameter of the distal internal carotid lumen as the denominator for stenosis measurement. The following velocity measurements were obtained: RIGHT ICA:  Systolic 38 cm/sec, Diastolic 15  cm/sec CCA:  43 cm/sec SYSTOLIC ICA/CCA RATIO:  0.9 ECA:  52 cm/sec LEFT ICA:  Systolic 27 cm/sec, Diastolic 10 cm/sec CCA:  51 cm/sec SYSTOLIC ICA/CCA RATIO:  0.5 ECA:  59 cm/sec Right Brachial SBP: Not acquired Left Brachial SBP: Not acquired RIGHT CAROTID ARTERY: No significant calcified disease of the right common carotid artery. Intermediate waveform maintained. Heterogeneous plaque without significant calcifications at the right carotid bifurcation. Low resistance waveform of the right ICA. No significant tortuosity. RIGHT VERTEBRAL ARTERY: Antegrade flow with low resistance waveform. LEFT CAROTID ARTERY: No significant calcified disease of the left common carotid artery. Intermediate waveform maintained. Heterogeneous plaque at the left carotid bifurcation without significant calcifications. Low resistance waveform of the left ICA. LEFT VERTEBRAL ARTERY:  Antegrade flow with low resistance waveform. IMPRESSION: Color duplex indicates minimal heterogeneous plaque, with no hemodynamically significant stenosis by duplex criteria in the extracranial cerebrovascular circulation. Signed, Yvone Neu. Loreta Ave, DO [facility] Electronically Signed   By: Gilmer Mor D.O.   On: 03/27/2018 09:01    Dg Hip Unilat W Or Wo Pelvis 2-3 Views Right   Result Date: 03/26/2018 CLINICAL DATA:  Right hip pain today.  No known injury. EXAM: DG HIP (WITH OR WITHOUT PELVIS) 2-3V RIGHT COMPARISON:  None. FINDINGS: There is no evidence of hip fracture or dislocation. There is no evidence of arthropathy or other focal bone  abnormality. IMPRESSION: Normal exam. Electronically Signed   By: Drusilla Kanner M.D.   On: 03/26/2018 12:56             Medical Problem List and Plan: 1.  Right-sided weakness with dysarthria secondary to multiple small acute infarcts right cerebral hemisphere, posterior watershed distribution CIR evals today 2.  DVT Prophylaxis/Anticoagulation:  3. Pain Management: Tylenol as needed, add gabapentin for dysesthesia 4. Mood: Provide emotional support 5. Neuropsych: This patient is capable of making decisions on his own behalf. 6. Skin/Wound Care: Routine skin checks 7. Fluids/Electrolytes/Nutrition: Routine INO's with follow-up chemistries 8.  Hypertension.  Cozaar 25 mg daily.  Monitor with increased mobility Vitals:   03/29/18 1419 03/30/18 0620  BP: 107/68 (!) 91/56  Pulse: 73 62  Resp: 18 16  Temp: 97.8 F (36.6 C) 97.8 F (36.6 C)  SpO2: 98% 100%   9.  Hyperlipidemia.  Lipitor 10.Tobacco abuse.Counseling       Post Admission Physician Evaluation: 1. Functional deficits secondary  to Right-sided weakness with dysarthria secondary to multiple small acute infarcts right cerebral hemisphere, posterior watershed distribution. 2. Patient is admitted to receive collaborative, interdisciplinary care between the physiatrist, rehab nursing staff, and therapy team. 3. Patient's level of medical complexity and substantial therapy needs in context of that medical necessity cannot be provided at a lesser intensity of care such as a SNF. 4. Patient has experienced substantial functional loss from his/her baseline which was documented above under the "Functional History" and "Functional Status" headings.  Judging by the patient's diagnosis, physical exam, and functional history, the patient has potential for functional progress which will result in measurable gains while on inpatient rehab.  These gains will be of substantial and practical use upon discharge  in facilitating mobility and  self-care at the household level. 5. Physiatrist will provide 24 hour management of medical needs as well as oversight of the therapy plan/treatment and provide guidance as appropriate regarding the interaction of the two. 6. The Preadmission Screening has been reviewed and patient status is unchanged unless otherwise stated above. 7. 24 hour  rehab nursing will assist with bladder management, bowel management, safety, skin/wound care, disease management, medication administration, pain management and patient education  and help integrate therapy concepts, techniques,education, etc. 8. PT will assess and treat for/with: pre gait, gait training, endurance , safety, equipment, neuromuscular re education.   Goals are: sup. 9. OT will assess and treat for/with: ADLs, Cognitive perceptual skills, Neuromuscular re education, safety, endurance, equipment.   Goals are: Sup. Therapy may proceed with showering this patient. 10. SLP will assess and treat for/with: cognitive and linguistic eval.  Goals are: Mod I med management, stroke ed. 11. Case Management and Social Worker will assess and treat for psychological issues and discharge planning. 12. Team conference will be held weekly to assess progress toward goals and to determine barriers to discharge. 13. Patient will receive at least 3 hours of therapy per day at least 5 days per week. 14. ELOS: 7-10d       15. Prognosis:  good         "I have personally performed a face to face diagnostic evaluation of this patient.  Additionally, I have reviewed and concur with the physician assistant's documentation above."  Erick ColaceAndrew E. Dezhane Staten M.D. [facility] FAAPM&R (Sports Med, Neuromuscular Med) Diplomate Am Board of Electrodiagnostic Med  Lynnae PrudeDaniel J Angiulli, PA-C 03/28/2018

## 2018-03-30 NOTE — Evaluation (Signed)
Occupational Therapy Assessment and Plan  Patient Details  Name: Cory Harvey MRN: [id number] Date of Birth: Sep 01, 1961  OT Diagnosis: ataxia, cognitive deficits, disturbance of vision and hemiplegia affecting non-dominant side Rehab Potential: Rehab Potential (ACUTE ONLY): Good ELOS: 10-12 days   Today's Date: 03/30/2018 OT Individual Time: 0350-0938 OT Individual Time Calculation (min): 75 min     Problem List:  Patient Active Problem List   Diagnosis Date Noted  . Cerebrovascular accident (CVA) involving right cerebral hemisphere ([place]) 03/29/2018  . CVA (cerebral vascular accident) ([place]) 03/26/2018  . Acute CVA (cerebrovascular accident) ([place]) 09/02/2017  . Gait disturbance 09/02/2017  . History of CVA (cerebrovascular accident) 04/18/2017  . Tobacco use 04/18/2017  . Depression   . Hypertension   . Stroke [facility])     Past Medical History:  Past Medical History:  Diagnosis Date  . Depression   . History of CVA (cerebrovascular accident)   . Hypertension   . Stroke ([place])    x 4  . Tobacco abuse    Past Surgical History:  Past Surgical History:  Procedure Laterality Date  . kidney surgery Left 1983  . TEE WITHOUT CARDIOVERSION N/A 03/27/2018   Procedure: TRANSESOPHAGEAL ECHOCARDIOGRAM (TEE);  Surgeon: Teodoro Spray, MD;  Location: [facility] ORS;  Service: Cardiovascular;  Laterality: N/A;    Assessment & Plan Clinical Impression:A 57 year old right-handed male with history of CVA x4 maintained on aspirin 81 mg daily and Plavix, tobacco abuse, hypertension. Presented to [facility] 03/26/2018 with right-sided weakness and slurred speech. Per chart review patient lives alone. Reported to be independent prior to admission and driving. One level home. He has a brother as well as a father in the area that check on him regularly. Cranial CT scan negative. Patient did not receive TPA. MRI showed multiple small acute infarcts in the right cerebral hemisphere, posterior watershed  distribution. Advanced chronic small vessel disease with multiple remote lacunar infarctions. Carotid Dopplers with no ICA stenosis. Echocardiogram with ejection fraction of 65%. Systolic function was normal. TEE 03/27/2018 showing no vegetation without thrombus. No defect or PFO. Presently on aspirin 325 mg daily as well as Plavix for CVA prophylaxis. Subcu Lovenox for DVT prophylaxis. Tolerating a regular diet. Physical and occupational therapy evaluations completed. Patient to be admitted for a comprehensive inpatient rehab program. Patient transferred to CIR on 03/29/2018 .    Patient currently requires min with basic self-care skills secondary to muscle weakness, decreased cardiorespiratoy endurance, impaired timing and sequencing, ataxia and decreased coordination, decreased visual acuity,  decreased attention, decreased awareness, decreased problem solving, decreased safety awareness, decreased memory and delayed processing and decreased standing balance, decreased postural control, hemiplegia and decreased balance strategies.  Prior to hospitalization, patient could complete ADLs/IADls with independent .  Patient will benefit from skilled intervention to decrease level of assist with basic self-care skills, increase independence with basic self-care skills and increase level of independence with iADL prior to discharge home with care partner.  Anticipate patient will require 24 hour supervision and follow up home health.  OT - End of Session Activity Tolerance: Tolerates 10 - 20 min activity with multiple rests Endurance Deficit: Yes Endurance Deficit Description: Required rest breaks throughout bathing/dressing tasks OT Assessment Rehab Potential (ACUTE ONLY): Good OT Barriers to Discharge: Decreased caregiver support OT Barriers to Discharge Comments: Recommend 24 hr supervision assist at d/c, pt lived alone PTA OT Patient demonstrates impairments in the following area(s):  Balance;Behavior;Cognition;Perception;Safety;Sensory;Endurance;Motor;Vision OT Basic ADL's Functional Problem(s): Eating;Grooming;Bathing;Dressing;Toileting OT Transfers Functional Problem(s): Toilet;Tub/Shower OT  Additional Impairment(s): Fuctional Use of Upper Extremity OT Plan OT Intensity: Minimum of 1-2 x/day, 45 to 90 minutes OT Frequency: 5 out of 7 days OT Duration/Estimated Length of Stay: 10-12 days OT Treatment/Interventions: Balance/vestibular training;Discharge planning;Functional electrical stimulation;Pain management;Self Care/advanced ADL retraining;Therapeutic Activities;UE/LE Coordination activities;Visual/perceptual remediation/compensation;Therapeutic Exercise;Skin care/wound managment;Patient/family education;Functional mobility training;Disease mangement/prevention;Cognitive remediation/compensation;DME/adaptive equipment instruction;Neuromuscular re-education;Psychosocial support;Splinting/orthotics;UE/LE Strength taining/ROM OT Self Feeding Anticipated Outcome(s): Mod I OT Basic Self-Care Anticipated Outcome(s): Supervision OT Toileting Anticipated Outcome(s): Supervision OT Bathroom Transfers Anticipated Outcome(s): Supervision OT Recommendation Patient destination: Home Follow Up Recommendations: Home health OT;24 hour supervision/assistance Equipment Recommended: To be determined   Skilled Therapeutic Intervention Pt seen for OT eval and ADL bathing/dressing session. Pt in supine upon arrival, easily awoken and agreeable to tx session. He ambulated throughout session initially with RW and min A, however, required increased assist and VCs for RW management in functional context, therefore transitioned to [facility]. He bathed seated on shower bench, required VCs for sequencing and transitioning from one body part to the next. He exited shower and dressed seated in chair requiring assist for threading R LE and steadying assist while pulling pants up. Grooming tasks completed  standing at sink with steadying assist.  Pt set-up to eat breakfast, able to correctly identify all items on meal tray however required VCs for smaller bite size.  Completed 9 hole peg test, see results below. Pt left seated in standard chair at end of session, QRB donned, chair alarm on and all needs in reach. Reviewed need for assist with all mobility and use of call bell for any needs- pt voiced understanding. Education completed throughout session regarding role of OT, POC, CVA recovery,  OT goals and d/c planning.   OT Evaluation Precautions/Restrictions  Precautions Precautions: Fall Restrictions Weight Bearing Restrictions: No General Chart Reviewed: Yes Pain   No/denies pain Home Living/Prior Functioning Home Living Family/patient expects to be discharged to:: Private residence Living Arrangements: Alone Available Help at Discharge: Available PRN/intermittently, Other (Comment)(Reports family members live nearby) Type of Home: House Home Access: Stairs to enter Entrance Stairs-Rails: Can reach both Home Layout: One level Bathroom Shower/Tub: Chiropodist: Standard  Lives With: Alone IADL History Homemaking Responsibilities: Yes Current License: Yes Mode of Transportation: Car Occupation: On disability Prior Function Level of Independence: Independent with basic ADLs, Independent with homemaking with ambulation, Independent with gait  Able to Take Stairs?: Yes Driving: Yes Vocation: On disability Comments: Pt was previously driving and independent in daily routines Vision Baseline Vision/History: Wears glasses Patient Visual Report: No change from baseline Vision Assessment?: Vision impaired- to be further tested in functional context Additional Comments: Pt reports no chanage from baseline, however, demonstrates some perceptual deficits during functional tasks.  Perception  Perception: Impaired Inattention/Neglect: Does not attend to right  visual field Praxis Praxis: Impaired Praxis Impairment Details: Motor planning Cognition Overall Cognitive Status: Impaired/Different from baseline Arousal/Alertness: Awake/alert Orientation Level: Person;Place;Situation Person: Oriented Place: Oriented Situation: Oriented Year: 2018 Month: April Day of Week: Incorrect Memory: Impaired Memory Impairment: Decreased recall of new information Immediate Memory Recall: Sock;Blue;Bed Memory Recall: Sock;Blue;Bed Memory Recall Sock: Without Cue Memory Recall Blue: Without Cue Memory Recall Bed: Without Cue Attention: Sustained Focused Attention: Impaired Awareness: Impaired Awareness Impairment: Emergent impairment Problem Solving: Impaired Problem Solving Impairment: Verbal basic;Functional basic Executive Function: Sequencing;Organizing Reasoning: Impaired Sequencing: Impaired Sequencing Impairment: Verbal basic;Functional basic Behaviors: Impulsive Safety/Judgment: Impaired Comments: Decreased safety awareness; decreased awareness of deficits Sensation Sensation Light Touch: Impaired Detail Light Touch Impaired Details: Impaired RUE Stereognosis:  Appears Intact Hot/Cold: Appears Intact Proprioception: Impaired Detail Proprioception Impaired Details: Impaired RUE Coordination Gross Motor Movements are Fluid and Coordinated: No Fine Motor Movements are Fluid and Coordinated: No Coordination and Movement Description: Mild R hemiplegia Finger Nose Finger Test: Decreased speed B UEs 9 Hole Peg Test: R: 50 sec L:1 min 2sec Motor  Motor Motor: Hemiplegia Motor - Skilled Clinical Observations: Mild R hemiplegia Trunk/Postural Assessment  Cervical Assessment Cervical Assessment: Within Functional Limits Thoracic Assessment Thoracic Assessment: Within Functional Limits Lumbar Assessment Lumbar Assessment: Within Functional Limits Postural Control Postural Control: Within Functional Limits  Balance Balance Balance  Assessed: Yes Dynamic Sitting Balance Dynamic Sitting - Balance Support: During functional activity;Feet supported Dynamic Sitting - Level of Assistance: 5: Stand by assistance Sitting balance - Comments: Sitting to complete bathing/dressing Static Standing Balance Static Standing - Balance Support: During functional activity;No upper extremity supported Static Standing - Level of Assistance: 4: Min assist Dynamic Standing Balance Dynamic Standing - Balance Support: During functional activity;No upper extremity supported Dynamic Standing - Level of Assistance: 4: Min assist Dynamic Standing - Comments: Standing during bathing/dressing tasks Extremity/Trunk Assessment RUE Assessment RUE Assessment: Exceptions to WFL(4+/5 throughout) LUE Assessment LUE Assessment: Within Functional Limits(5/5 throughout)   See Function Navigator for Current Functional Status.   Refer to Care Plan for Long Term Goals  Recommendations for other services: None    Discharge Criteria: Patient will be discharged from OT if patient refuses treatment 3 consecutive times without medical reason, if treatment goals not met, if there is a change in medical status, if patient makes no progress towards goals or if patient is discharged from hospital.  The above assessment, treatment plan, treatment alternatives and goals were discussed and mutually agreed upon: by patient  Camille Thau L 03/30/2018, 10:27 AM

## 2018-03-30 NOTE — Evaluation (Signed)
Speech Language Pathology Assessment and Plan  Patient Details  Name: Cory Harvey MRN: [id number] Date of Birth: 04-20-1961  SLP Diagnosis: Dysarthria;Cognitive Impairments  Rehab Potential: Good ELOS: 10-12 days     Today's Date: 03/30/2018 SLP Individual Time: 1305-1400 SLP Individual Time Calculation (min): 55 min   Problem List:  Patient Active Problem List   Diagnosis Date Noted  . Cerebrovascular accident (CVA) involving right cerebral hemisphere ([place]) 03/29/2018  . CVA (cerebral vascular accident) ([place]) 03/26/2018  . Acute CVA (cerebrovascular accident) ([place]) 09/02/2017  . Gait disturbance 09/02/2017  . History of CVA (cerebrovascular accident) 04/18/2017  . Tobacco use 04/18/2017  . Depression   . Hypertension   . Stroke [facility])    Past Medical History:  Past Medical History:  Diagnosis Date  . Depression   . History of CVA (cerebrovascular accident)   . Hypertension   . Stroke ([place])    x 4  . Tobacco abuse    Past Surgical History:  Past Surgical History:  Procedure Laterality Date  . kidney surgery Left 1983  . TEE WITHOUT CARDIOVERSION N/A 03/27/2018   Procedure: TRANSESOPHAGEAL ECHOCARDIOGRAM (TEE);  Surgeon: Teodoro Spray, MD;  Location: [facility] ORS;  Service: Cardiovascular;  Laterality: N/A;    Assessment / Plan / Recommendation Clinical Impression Patient is a 57 year old right-handed male with history of CVA x4 maintained on aspirin 81 mg daily and Plavix, tobacco abuse, hypertension. Presented to [facility] 03/26/2018 with right-sided weakness and slurred speech. Per chart review patient lives alone. Reported to be independent prior to admission and driving. One level home. He has a brother as well as a father in the area that check on him regularly. Cranial CT scan negative. Patient did not receive TPA. MRI showed multiple small acute infarcts in the right cerebral hemisphere, posterior watershed distribution. Advanced chronic small vessel disease  with multiple remote lacunar infarctions. Carotid Dopplers with no ICA stenosis. Echocardiogram with ejection fraction of 65%. Systolic function was normal. TEE 03/27/2018 showing no vegetation without thrombus. No defect or PFO. Presently on aspirin 325 mg daily as well as Plavix for CVA prophylaxis. Subcu Lovenox for DVT prophylaxis. Tolerating a regular diet. Physical and occupational therapy evaluations completed. Patientto beadmitted for a comprehensive inpatientrehab program. Patient transferred to CIR on 03/29/2018.  Patient scored an 11/22 points on the MoCA-Blind with a score of 18 or above considered normal, however, baseline cognitive-linguistic deficits are not known at this time. Patient currently demonstrates deficits in attention, recall, awareness and problem solving with impulsivity which impacts his ability to complete functional and familiar tasks safely.  Patient also demonstrates a moderate dysarthria due to imprecise consonants and increased rate of speech which impacts his intelligibility. Patient demonstrates intermittent dysfluencies which patient reports is baseline from a previous CVA.  Mild word-finding deficits and intermittent semantic paraphasias as well as inappropriate laughter were also noted throughout conversation.  Patient would benefit from skilled SLP intervention to maximize his cognitive-linguistic function and overall functional independence prior to discharge.     Skilled Therapeutic Interventions          Administered a cognitive-linguistic evaluation. Please see above. Educated patient in regards to current cognitive-linguistic impairments and goals of skilled SLP intervention, he verbalized understanding.    SLP Assessment  Patient will need skilled [place] [facility] during CIR admission    Recommendations  Oral Care Recommendations: Oral care BID Recommendations for Other Services: Neuropsych consult Patient destination:  Home Follow up Recommendations: 24 hour supervision/assistance;Home Health SLP  Equipment Recommended: None recommended by SLP    SLP Frequency 3 to 5 out of 7 days   SLP Duration  SLP Intensity  SLP Treatment/Interventions 10-12 days   Minumum of 1-2 x/day, 30 to 90 minutes  Cognitive remediation/compensation;Cueing hierarchy;Internal/external aids;Environmental controls;Speech/Language facilitation;Functional tasks;Patient/family education;Therapeutic Activities    Pain No/Denies Pain   Function:   Cognition Comprehension Comprehension assist level: Follows basic conversation/direction with extra time/assistive device  Expression   Expression assist level: Expresses basic 50 - 74% of the time/requires cueing 25 - 49% of the time. Needs to repeat parts of sentences.  Social Interaction Social Interaction assist level: Interacts appropriately 90% of the time - Needs monitoring or encouragement for participation or interaction.  Problem Solving Problem solving assist level: Solves basic 50 - 74% of the time/requires cueing 25 - 49% of the time  Memory Memory assist level: Recognizes or recalls 50 - 74% of the time/requires cueing 25 - 49% of the time   Short Term Goals: Week 1: SLP Short Term Goal 1 (Week 1): Patient will demonstrate functional problem solving for basic and familiar tasks with Min A verbal cues. SLP Short Term Goal 2 (Week 1): Patient will utilize external memory aids to recall new, daily information with Min A verbal cues.  SLP Short Term Goal 3 (Week 1): Patient will demonstrate selective attention to functional tasks in a mildly distracting enviornment for ~30 minutes with Min A verbal cues for redirection.  SLP Short Term Goal 4 (Week 1): Patient will self-monitor and correct errors during functional tasks with Min A verbal cues.  SLP Short Term Goal 5 (Week 1): Patient will utilize a slow rate and over-articulation to maximize intelligibility to 90% at the  conversation level with Min A verbal cues.  SLP Short Term Goal 6 (Week 1): Patient will utilize word-fniding strategies at the conversation level with Min A verbal cues.   Refer to Care Plan for Long Term Goals  Recommendations for other services: Neuropsych  Discharge Criteria: Patient will be discharged from SLP if patient refuses treatment 3 consecutive times without medical reason, if treatment goals not met, if there is a change in medical status, if patient makes no progress towards goals or if patient is discharged from hospital.  The above assessment, treatment plan, treatment alternatives and goals were discussed and mutually agreed upon: by patient  Finlay Godbee 03/30/2018, 4:12 PM

## 2018-03-30 NOTE — Evaluation (Signed)
Physical Therapy Assessment and Plan  Patient Details  Name: Cory Harvey MRN: [id number] Date of Birth: 1961-04-23  PT Diagnosis: Abnormality of gait, Cognitive deficits, Coordination disorder, Difficulty walking, Hemiplegia non-dominant, Impaired cognition and Muscle weakness Rehab Potential: Good ELOS: 10-12 days   Today's Date: 03/30/2018 PT Individual Time: 1100-1155 PT Individual Time Calculation (min): 55 min    Problem List:  Patient Active Problem List   Diagnosis Date Noted  . Cerebrovascular accident (CVA) involving right cerebral hemisphere ([place]) 03/29/2018  . CVA (cerebral vascular accident) ([place]) 03/26/2018  . Acute CVA (cerebrovascular accident) ([place]) 09/02/2017  . Gait disturbance 09/02/2017  . History of CVA (cerebrovascular accident) 04/18/2017  . Tobacco use 04/18/2017  . Depression   . Hypertension   . Stroke [facility])     Past Medical History:  Past Medical History:  Diagnosis Date  . Depression   . History of CVA (cerebrovascular accident)   . Hypertension   . Stroke ([place])    x 4  . Tobacco abuse    Past Surgical History:  Past Surgical History:  Procedure Laterality Date  . kidney surgery Left 1983  . TEE WITHOUT CARDIOVERSION N/A 03/27/2018   Procedure: TRANSESOPHAGEAL ECHOCARDIOGRAM (TEE);  Surgeon: Teodoro Spray, MD;  Location: [facility] ORS;  Service: Cardiovascular;  Laterality: N/A;    Assessment & Plan Clinical Impression: Patient is a 57 year old right-handed male with history of CVA x4 maintained on aspirin 81 mg daily and Plavix, tobacco abuse, hypertension. Presented to [facility] 03/26/2018 with right-sided weakness and slurred speech. Per chart review patient lives alone. Reported to be independent prior to admission and driving. One level home. He has a brother as well as a father in the area that check on him regularly. Cranial CT scan negative. Patient did not receive TPA. MRI showed multiple small acute infarcts in the right cerebral  hemisphere, posterior watershed distribution. Advanced chronic small vessel disease with multiple remote lacunar infarctions. Carotid Dopplers with no ICA stenosis. Echocardiogram with ejection fraction of 65%. Systolic function was normal. TEE 03/27/2018 showing no vegetation without thrombus. No defect or PFO. Presently on aspirin 325 mg daily as well as Plavix for CVA prophylaxis. Subcu Lovenox for DVT prophylaxis. Tolerating a regular diet. Physical and occupational therapy evaluations completed. Patient transferred to CIR on 03/29/2018.   Patient currently requires min with mobility secondary to muscle weakness, decreased cardiorespiratoy endurance, unbalanced muscle activation, decreased coordination and decreased motor planning, decreased visual perceptual skills, decreased attention to right, decreased problem solving, decreased safety awareness, decreased memory and delayed processing and decreased standing balance, hemiplegia and decreased balance strategies.  Prior to hospitalization, patient was independent  with mobility and lived with Alone in a House home.  Home access is 6Stairs to enter.  Patient will benefit from skilled PT intervention to maximize safe functional mobility, minimize fall risk and decrease caregiver burden for planned discharge home with 24 hour supervision.  Anticipate patient will benefit from follow up [place] at discharge.  PT - End of Session Activity Tolerance: Tolerates 10 - 20 min activity with multiple rests Endurance Deficit: Yes Endurance Deficit Description: Required rest breaks throughout bathing/dressing tasks PT Assessment Rehab Potential (ACUTE/IP ONLY): Good PT Barriers to Discharge: Decreased caregiver support PT Barriers to Discharge Comments: lives alone PT Patient demonstrates impairments in the following area(s): Balance;Perception;Safety;Endurance;Motor PT Transfers Functional Problem(s): Bed Mobility;Bed to Chair;Car;Furniture PT Locomotion  Functional Problem(s): Stairs;Ambulation PT Plan PT Intensity: Minimum of 1-2 x/day ,45 to 90 minutes PT Frequency: 5 out of  7 days PT Duration Estimated Length of Stay: 10-12 days PT Treatment/Interventions: Visual/perceptual remediation/compensation;UE/LE Strength taining/ROM;Therapeutic Activities;Splinting/orthotics;Psychosocial support;Pain management;Functional mobility training;DME/adaptive equipment instruction;Discharge planning;Cognitive remediation/compensation;Ambulation/gait training;Balance/vestibular training;Community reintegration;Disease management/prevention;Functional electrical stimulation;Neuromuscular re-education;Patient/family education;Skin care/wound management;Stair training;Therapeutic Exercise;UE/LE Coordination activities;Wheelchair propulsion/positioning PT Transfers Anticipated Outcome(s): supervision PT Locomotion Anticipated Outcome(s): supervision community level gait PT Recommendation Recommendations for Other Services: Therapeutic Recreation consult Therapeutic Recreation Interventions: Outing/community reintergration Follow Up Recommendations: Home health PT Patient destination: Home Equipment Recommended: To be determined  Skilled Therapeutic Intervention  Pt in supine and agreeable to therapy, denies pain. Pt instructed patient in PT Evaluation and initiated treatment intervention; see below for results. Occasional seated rest break 2/2 fatigue and decreased endurance. Pt educated patient in [place], rehab potential, rehab goals, and discharge recommendations. Returned to room and assisted pt w/ toileting, min guard. Pt performed pericare w/ supervision. Ended session in w/c, call bell within reach and all needs met. Quick release belt donned and chair alarm activated.   PT Evaluation Precautions/Restrictions Precautions Precautions: Fall Restrictions Weight Bearing Restrictions: No Pain Pain Assessment Pain Scale: 0-10 Pain Score: 0-No pain Home  Living/Prior Functioning Home Living Available Help at Discharge: Available PRN/intermittently;Other (Comment)(Reports parents and brother live close by) Type of Home: House Home Access: Stairs to enter [employer] of Steps: 6 Entrance Stairs-Rails: Can reach both Home Layout: One level Bathroom Shower/Tub: Chiropodist: Standard  Lives With: Alone Prior Function Level of Independence: Independent with basic ADLs;Independent with homemaking with ambulation;Independent with gait;Independent with transfers  Able to Take Stairs?: Yes Driving: Yes Vocation: On disability Vocation Requirements: Previously worked for a Producer, television/film/video, however pt states he will not return after this most recent stroke, he is already on disability Comments: Pt was previously driving and independent in daily routines Vision/Perception  Vision - Assessment Additional Comments: Pt reports no chanage from baseline, however, demonstrates some perceptual deficits during functional tasks.  Perception Perception: Impaired Inattention/Neglect: Does not attend to right visual field Praxis Praxis: Impaired Praxis Impairment Details: Motor planning  Cognition Overall Cognitive Status: Impaired/Different from baseline Arousal/Alertness: Awake/alert Orientation Level: Oriented X4 Attention: Sustained Focused Attention: Impaired Memory: Impaired Memory Impairment: Decreased recall of new information Awareness: Impaired Awareness Impairment: Emergent impairment Problem Solving: Impaired Problem Solving Impairment: Verbal basic;Functional basic Executive Function: Sequencing;Organizing Reasoning: Impaired Sequencing: Impaired Sequencing Impairment: Verbal basic;Functional basic Behaviors: Impulsive Safety/Judgment: Impaired Comments: Decreased safety awareness; decreased awareness of deficits Sensation Sensation Light Touch: Appears Intact(pt reports no sensation deficits  in BLEs) Light Touch Impaired Details: Impaired RUE Stereognosis: Appears Intact Hot/Cold: Appears Intact Proprioception: Impaired Detail Proprioception Impaired Details: Impaired RUE Coordination Gross Motor Movements are Fluid and Coordinated: No Fine Motor Movements are Fluid and Coordinated: No Coordination and Movement Description: Mild increase in shakiness w/ active and purposeful movements on R side Finger Nose Finger Test: Decreased speed B UEs 9 Hole Peg Test: R: 50 sec L:1 min 2sec Motor  Motor Motor: Hemiplegia Motor - Skilled Clinical Observations: Mild R hemiplegia  Mobility Bed Mobility Bed Mobility: Rolling Right;Rolling Left;Supine to Sit;Sit to Supine Rolling Right: 5: Supervision Rolling Left: 5: Supervision Supine to Sit: 5: Supervision Sit to Supine: 5: Supervision Transfers Transfers: Yes Stand Pivot Transfers: 4: Min guard Locomotion  Ambulation Ambulation: Yes Ambulation/Gait Assistance: 4: Min assist Ambulation Distance (Feet): 150 Feet Assistive device: None Ambulation/Gait Assistance Details: Verbal cues for precautions/safety Gait Gait velocity: decreased Stairs / Additional Locomotion Stairs: Yes Stairs Assistance: 4: Min assist Stairs Assistance Details: Verbal cues for precautions/safety Stair Management Technique:  Two rails Number of Stairs: 4 Height of Stairs: 6 Wheelchair Mobility Wheelchair Mobility: No  Trunk/Postural Assessment  Cervical Assessment Cervical Assessment: Within Functional Limits Thoracic Assessment Thoracic Assessment: Within Functional Limits Lumbar Assessment Lumbar Assessment: Within Functional Limits Postural Control Postural Control: Within Functional Limits  Balance Balance Balance Assessed: Yes Standardized Balance Assessment Standardized Balance Assessment: Berg Balance Test Berg Balance Test Sit to Stand: Able to stand without using hands and stabilize independently Standing Unsupported: Able to  stand safely 2 minutes Sitting with Back Unsupported but Feet Supported on Floor or Stool: Able to sit safely and securely 2 minutes Stand to Sit: Sits safely with minimal use of hands Transfers: Able to transfer safely, minor use of hands Standing Unsupported with Eyes Closed: Able to stand 10 seconds with supervision Standing Ubsupported with Feet Together: Able to place feet together independently and stand for 1 minute with supervision From Standing, Reach Forward with Outstretched Arm: Can reach forward >12 cm safely (5") From Standing Position, Pick up Object from Floor: Able to pick up shoe, needs supervision From Standing Position, Turn to Look Behind Over each Shoulder: Turn sideways only but maintains balance Turn 360 Degrees: Able to turn 360 degrees safely but slowly Standing Unsupported, Alternately Place Feet on Step/Stool: Able to complete 4 steps without aid or supervision Standing Unsupported, One Foot in Front: Able to take small step independently and hold 30 seconds Standing on One Leg: Tries to lift leg/unable to hold 3 seconds but remains standing independently Total Score: 41 Dynamic Sitting Balance Dynamic Sitting - Balance Support: During functional activity;Feet supported Dynamic Sitting - Level of Assistance: 5: Stand by assistance Sitting balance - Comments: Sitting to complete bathing/dressing Static Standing Balance Static Standing - Balance Support: During functional activity;No upper extremity supported Static Standing - Level of Assistance: 5: Stand by assistance Dynamic Standing Balance Dynamic Standing - Balance Support: During functional activity;No upper extremity supported Dynamic Standing - Level of Assistance: 4: Min assist Dynamic Standing - Comments: Standing during bathing/dressing tasks Extremity Assessment  RUE Assessment RUE Assessment: Exceptions to WFL(4+/5 throughout) LUE Assessment LUE Assessment: Within Functional Limits(5/5  throughout) RLE Assessment RLE Assessment: Exceptions to WFL(4/5 hip flexion, otherwise WFL) LLE Assessment LLE Assessment: Within Functional Limits   See Function Navigator for Current Functional Status.   Refer to Care Plan for Long Term Goals  Recommendations for other services: Therapeutic Recreation  Outing/community reintegration  Discharge Criteria: Patient will be discharged from PT if patient refuses treatment 3 consecutive times without medical reason, if treatment goals not met, if there is a change in medical status, if patient makes no progress towards goals or if patient is discharged from hospital.  The above assessment, treatment plan, treatment alternatives and goals were discussed and mutually agreed upon: by patient  Jadwiga Faidley K Arnette 03/30/2018, 12:41 PM

## 2018-03-31 ENCOUNTER — Inpatient Hospital Stay (HOSPITAL_COMMUNITY): Payer: Medicare Other

## 2018-03-31 DIAGNOSIS — I10 Essential (primary) hypertension: Secondary | ICD-10-CM

## 2018-03-31 NOTE — Progress Notes (Signed)
Physical Therapy Session Note  Patient Details  Name: Cory Harvey MRN: [id number] Date of Birth: [birth year]  Today's Date: 03/31/2018 PT Individual Time: [id number] PT Individual Time Calculation (min): 43 min   Short Term Goals: Week 1:  PT Short Term Goal 1 (Week 1): Pt will demonstrate appropriate safety awareness during functional mobility 50% of the time PT Short Term Goal 2 (Week 1): Pt will participate in 30 min of upright activity w/o increase in fatigue PT Short Term Goal 3 (Week 1): Pt will maintain dynamic standing balance w/ supervision  Skilled Therapeutic Interventions/Progress Updates:    Pt supine in bed upon PT arrival, agreeable to therapy tx and denies pain. Pt ambulated from room<>gym x 150 ft without AD, min assist. Pt worked on dynamic standing balance to toss horse shoes while standing on airex, x 2 trials. Pt worked on dynamic standing balance to ambulate forwards, backwards and sideways with min assist. Pt performed toe taps on colored cones x 2 trials working on dynamic standing balance. Pt ambulated from gym>dayroom with min assist, verbal cues for increased step length. Pt used nustep x 5 minutes on workload 5 for global strengthening and endurance. Pt ambulated back to room and left supine in bed with needs in reach.   Therapy Documentation Precautions:  Precautions Precautions: Fall Restrictions Weight Bearing Restrictions: No   See Function Navigator for Current Functional Status.   Therapy/Group: Individual Therapy  Cresenciano GenreEmily van Schagen, PT, DPT 03/31/2018, 7:45 AM

## 2018-03-31 NOTE — Progress Notes (Signed)
Subjective/Complaints:   Objective: Vital Signs: Blood pressure 105/84, pulse 63, temperature 97.8 F (36.6 C), temperature source Oral, resp. rate 18, height 5' 8"  (1.727 m), weight 79 kg (174 lb 2.6 oz), SpO2 98 %. No results found. Results for orders placed or performed during the hospital encounter of 03/29/18 (from the past 72 hour(s))  CBC     Status: Abnormal   Collection Time: 03/29/18  2:10 PM  Result Value Ref Range   WBC 6.9 4.0 - 10.5 K/uL   RBC 5.61 4.22 - 5.81 MIL/uL   Hemoglobin 16.0 13.0 - 17.0 g/dL   HCT 48.1 39.0 - 52.0 %   MCV 85.7 78.0 - 100.0 fL   MCH 28.5 26.0 - 34.0 pg   MCHC 33.3 30.0 - 36.0 g/dL   RDW 15.1 11.5 - 15.5 %   Platelets 447 (H) 150 - 400 K/uL    Comment: Performed at [facility] [address], [place], [place] [address]  Creatinine, serum     Status: Abnormal   Collection Time: 03/29/18  2:10 PM  Result Value Ref Range   Creatinine, Ser 1.30 (H) 0.61 - 1.24 mg/dL   GFR calc non Af Amer 59 (L) >60 mL/min   GFR calc Af Amer >60 >60 mL/min    Comment: (NOTE) The eGFR has been calculated using the CKD EPI equation. This calculation has not been validated in all clinical situations. eGFR's persistently <60 mL/min signify possible Chronic Kidney Disease. Performed at [facility], [place] [address], [place], [place] [address]      HEENT: normal Cardio: RRR and no murmur Resp: CTA B/L and unlabored GI: BS positive and NT, nd Extremity:  No Edema Skin:   Intact Neuro: Confused Musc/Skel:  Other No pain with UE or LE ROM Gen NAD 4/5 RUE and RLE 5/5 left side  Assessment/Plan: 1. 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 Physiatrist is providing close team supervision and 24 hour management of  active medical problems listed below. Physiatrist and rehab team continue to assess barriers to discharge/monitor patient progress toward functional and medical goals. FIM: Function - Bathing Position: Shower Body parts bathed by patient: Right arm, Right lower leg, Left arm, Left lower leg, Chest, Abdomen, Front perineal area, Buttocks, Right upper leg, Left upper leg Body parts bathed by helper: Back Assist Level: Touching or steadying assistance(Pt > 75%)  Function- Upper Body Dressing/Undressing What is the patient wearing?: Pull over shirt/dress Pull over shirt/dress - Perfomed by patient: Thread/unthread right sleeve, Thread/unthread left sleeve, Put head through opening, Pull shirt over trunk Assist Level: Supervision or verbal cues Function - Lower Body Dressing/Undressing What is the patient wearing?: Underwear, Pants, Non-skid slipper socks Position: Wheelchair/chair at sink Underwear - Performed by patient: Thread/unthread left underwear leg, Pull underwear up/down Underwear - Performed by helper: Thread/unthread right underwear leg Pants- Performed by patient: Thread/unthread left pants leg, Pull pants up/down, Fasten/unfasten pants Pants- Performed by helper: Thread/unthread right pants leg Non-skid slipper socks- Performed by patient: Don/doff right sock, Don/doff left sock Assist for footwear: Supervision/touching assist Assist for lower body dressing: Touching or steadying assistance (Pt > 75%)  Function - Toileting Toileting steps completed by patient: Adjust clothing prior to toileting, Performs perineal hygiene, Adjust clothing after toileting Toileting Assistive Devices: Grab bar or rail Assist level: Touching or steadying assistance (  Pt.75%)  Function - Air cabin crew transfer assistive device: Grab bar, Walker Assist level to toilet: Touching or steadying assistance (Pt > 75%) Assist level from toilet: Touching or steadying assistance (Pt >  75%)  Function - Chair/bed transfer Chair/bed transfer method: Stand pivot Chair/bed transfer assist level: Supervision or verbal cues Chair/bed transfer assistive device: Armrests, Bedrails Chair/bed transfer details: Verbal cues for technique, Verbal cues for precautions/safety  Function - Locomotion: Wheelchair Will patient use wheelchair at discharge?: No Function - Locomotion: Ambulation Assistive device: No device Max distance: 150' Assist level: Touching or steadying assistance (Pt > 75%) Assist level: Touching or steadying assistance (Pt > 75%) Assist level: Touching or steadying assistance (Pt > 75%) Assist level: Touching or steadying assistance (Pt > 75%) Assist level: Touching or steadying assistance (Pt > 75%)  Function - Comprehension Comprehension: Auditory Comprehension assist level: Follows complex conversation/direction with extra time/assistive device  Function - Expression Expression: Verbal Expression assist level: Expresses basic 50 - 74% of the time/requires cueing 25 - 49% of the time. Needs to repeat parts of sentences.  Function - Social Interaction Social Interaction assist level: Interacts appropriately 90% of the time - Needs monitoring or encouragement for participation or interaction.  Function - Problem Solving Problem solving assist level: Solves basic 50 - 74% of the time/requires cueing 25 - 49% of the time  Function - Memory Memory assist level: Recognizes or recalls 50 - 74% of the time/requires cueing 25 - 49% of the time Patient normally able to recall (first 3 days only): That he or she is in a hospital, Staff names and faces, Current season  Medical Problem List and Plan: 1.Right-sided weakness withdysarthriasecondary to multiple small acute infarcts right cerebral hemisphere, posterior watershed distribution Cont PT, OT, SLP 2. DVT Prophylaxis/Anticoagulation:  3. Pain Management:Tylenol as needed, add gabapentin for  dysesthesia 4. Mood:Provide emotional support 5. Neuropsych: This patientiscapable of making decisions on hisown behalf. 6. Skin/Wound Care:Routine skin checks 7. Fluids/Electrolytes/Nutrition:Routine INO's with follow-up chemistries Intake low 779m, check BMET in am 8.Hypertension. [place] 239mdaily. Monitor with increased mobility     Vitals:   03/29/18 1419 03/30/18 0620  BP: 107/68 (!) 91/56  Pulse: 73 62  Resp: 18 16  Temp: 97.8 F (36.6 C) 97.8 F (36.6 C)  SpO2: 98% 100%  Running a little low, check orthostatic vitals 9.Hyperlipidemia. Lipitor 10.Tobacco abuse.Counseling      LOS (Days) 2 A FACE TO FACE EVALUATION WAS PERFORMED  AnCharlett Blake/21/2019, 7:50 AM

## 2018-04-01 ENCOUNTER — Inpatient Hospital Stay (HOSPITAL_COMMUNITY): Payer: Medicare Other | Admitting: Occupational Therapy

## 2018-04-01 ENCOUNTER — Inpatient Hospital Stay (HOSPITAL_COMMUNITY): Payer: Medicare Other | Admitting: Speech Pathology

## 2018-04-01 ENCOUNTER — Inpatient Hospital Stay (HOSPITAL_COMMUNITY): Payer: Medicare Other | Admitting: Physical Therapy

## 2018-04-01 ENCOUNTER — Inpatient Hospital Stay (HOSPITAL_COMMUNITY): Payer: Medicare Other

## 2018-04-01 LAB — CBC WITH DIFFERENTIAL/PLATELET
BASOS ABS: 0 10*3/uL (ref 0.0–0.1)
BASOS PCT: 1 %
Eosinophils Absolute: 0.2 10*3/uL (ref 0.0–0.7)
Eosinophils Relative: 3 %
HEMATOCRIT: 46.7 % (ref 39.0–52.0)
HEMOGLOBIN: 15.7 g/dL (ref 13.0–17.0)
LYMPHS PCT: 21 %
Lymphs Abs: 1.4 10*3/uL (ref 0.7–4.0)
MCH: 28.7 pg (ref 26.0–34.0)
MCHC: 33.6 g/dL (ref 30.0–36.0)
MCV: 85.4 fL (ref 78.0–100.0)
Monocytes Absolute: 0.4 10*3/uL (ref 0.1–1.0)
Monocytes Relative: 6 %
Neutro Abs: 4.5 10*3/uL (ref 1.7–7.7)
Neutrophils Relative %: 69 %
Platelets: 427 10*3/uL — ABNORMAL HIGH (ref 150–400)
RBC: 5.47 MIL/uL (ref 4.22–5.81)
RDW: 14.8 % (ref 11.5–15.5)
WBC: 6.6 10*3/uL (ref 4.0–10.5)

## 2018-04-01 LAB — COMPREHENSIVE METABOLIC PANEL
ALBUMIN: 3.5 g/dL (ref 3.5–5.0)
ALK PHOS: 49 U/L (ref 38–126)
ALT: 30 U/L (ref 17–63)
AST: 23 U/L (ref 15–41)
Anion gap: 6 (ref 5–15)
BILIRUBIN TOTAL: 0.6 mg/dL (ref 0.3–1.2)
BUN: 10 mg/dL (ref 6–20)
CALCIUM: 9.2 mg/dL (ref 8.9–10.3)
CO2: 25 mmol/L (ref 22–32)
CREATININE: 1.04 mg/dL (ref 0.61–1.24)
Chloride: 106 mmol/L (ref 101–111)
GFR calc Af Amer: >60 mL/min (ref >60)
GFR calc non Af Amer: >60 mL/min (ref >60)
GLUCOSE: 95 mg/dL (ref 65–99)
Potassium: 4 mmol/L (ref 3.5–5.1)
Sodium: 137 mmol/L (ref 135–145)
TOTAL PROTEIN: 6.1 g/dL — AB (ref 6.5–8.1)

## 2018-04-01 MED ORDER — LOSARTAN POTASSIUM 25 MG PO TABS
12.5000 mg | ORAL_TABLET | Freq: Every day | ORAL | Status: DC
  Administered 2018-04-02: 12.5 mg via ORAL
  Filled 2018-04-01 (×2): qty 0.5

## 2018-04-01 NOTE — Progress Notes (Signed)
Subjective/Complaints: Seen in gym.  Woke up early ~4am, no pain, no breathing issues no bowel or bladder issues ROS-- negative for CP/SOB, N/V/D Objective: Vital Signs: Blood pressure 135/81, pulse 66, temperature (!) 97.5 F (36.4 C), temperature source Oral, resp. rate 17, height 5' 8"  (1.727 m), weight 79 kg (174 lb 2.6 oz), SpO2 99 %. No results found. Results for orders placed or performed during the hospital encounter of 03/29/18 (from the past 72 hour(s))  CBC     Status: Abnormal   Collection Time: 03/29/18  2:10 PM  Result Value Ref Range   WBC 6.9 4.0 - 10.5 K/uL   RBC 5.61 4.22 - 5.81 MIL/uL   Hemoglobin 16.0 13.0 - 17.0 g/dL   HCT 48.1 39.0 - 52.0 %   MCV 85.7 78.0 - 100.0 fL   MCH 28.5 26.0 - 34.0 pg   MCHC 33.3 30.0 - 36.0 g/dL   RDW 15.1 11.5 - 15.5 %   Platelets 447 (H) 150 - 400 K/uL    Comment: Performed at [facility] [address], [place], [place] [address]  Creatinine, serum     Status: Abnormal   Collection Time: 03/29/18  2:10 PM  Result Value Ref Range   Creatinine, Ser 1.30 (H) 0.61 - 1.24 mg/dL   GFR calc non Af Amer 59 (L) >60 mL/min   GFR calc Af Amer >60 >60 mL/min    Comment: (NOTE) The eGFR has been calculated using the CKD EPI equation. This calculation has not been validated in all clinical situations. eGFR's persistently <60 mL/min signify possible Chronic Kidney Disease. Performed at [facility], [place] [address] [place], [place] [address]   CBC WITH DIFFERENTIAL     Status: Abnormal   Collection Time: 04/01/18  6:17 AM  Result Value Ref Range   WBC 6.6 4.0 - 10.5 K/uL   RBC 5.47 4.22 - 5.81 MIL/uL   Hemoglobin 15.7 13.0 - 17.0 g/dL   HCT 46.7 39.0 - 52.0 %   MCV 85.4 78.0 - 100.0 fL   MCH 28.7 26.0 - 34.0 pg   MCHC 33.6 30.0 - 36.0 g/dL   RDW 14.8 11.5 - 15.5 %   Platelets 427 (H) 150 - 400 K/uL   Neutrophils Relative % 69 %   Neutro Abs 4.5 1.7 - 7.7 K/uL   Lymphocytes Relative 21 %   Lymphs Abs 1.4 0.7 - 4.0  K/uL   Monocytes Relative 6 %   Monocytes Absolute 0.4 0.1 - 1.0 K/uL   Eosinophils Relative 3 %   Eosinophils Absolute 0.2 0.0 - 0.7 K/uL   Basophils Relative 1 %   Basophils Absolute 0.0 0.0 - 0.1 K/uL    Comment: Performed at [facility], [place] [address] [place], [place] [address]  Comprehensive metabolic panel     Status: Abnormal   Collection Time: 04/01/18  6:17 AM  Result Value Ref Range   Sodium 137 135 - 145 mmol/L   Potassium 4.0 3.5 - 5.1 mmol/L   Chloride 106 101 - 111 mmol/L   CO2 25 22 - 32 mmol/L   Glucose, Bld 95 65 - 99 mg/dL   BUN 10 6 - 20 mg/dL   Creatinine, Ser 1.04 0.61 - 1.24 mg/dL   Calcium 9.2 8.9 - 10.3 mg/dL   Total Protein 6.1 (L) 6.5 - 8.1 g/dL   Albumin 3.5 3.5 - 5.0 g/dL   AST 23 15 - 41 U/L   ALT 30 17 -  63 U/L   Alkaline Phosphatase 49 38 - 126 U/L   Total Bilirubin 0.6 0.3 - 1.2 mg/dL   GFR calc non Af Amer >60 >60 mL/min   GFR calc Af Amer >60 >60 mL/min    Comment: (NOTE) The eGFR has been calculated using the CKD EPI equation. This calculation has not been validated in all clinical situations. eGFR's persistently <60 mL/min signify possible Chronic Kidney Disease.    Anion gap 6 5 - 15    Comment: Performed at [facility] [address] [place], [place] [address]     HEENT: normal Cardio: RRR and no murmur Resp: CTA B/L and unlabored GI: BS positive and NT, nd Extremity:  No Edema Skin:   Intact Neuro: Confused Musc/Skel:  Other No pain with UE or LE ROM Gen NAD 4/5 RUE and RLE 5/5 left side  Assessment/Plan: 1. 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 Physiatrist is providing close team supervision and 24 hour management of active medical problems listed below. Physiatrist and  rehab team continue to assess barriers to discharge/monitor patient progress toward functional and medical goals. FIM: Function - Bathing Position: Shower Body parts bathed by patient: Right arm, Right lower leg, Left arm, Left lower leg, Chest, Abdomen, Front perineal area, Buttocks, Right upper leg, Left upper leg Body parts bathed by helper: Back Assist Level: Touching or steadying assistance(Pt > 75%)  Function- Upper Body Dressing/Undressing What is the patient wearing?: Pull over shirt/dress Pull over shirt/dress - Perfomed by patient: Thread/unthread right sleeve, Thread/unthread left sleeve, Put head through opening, Pull shirt over trunk Assist Level: Supervision or verbal cues Function - Lower Body Dressing/Undressing What is the patient wearing?: Underwear, Pants, Non-skid slipper socks Position: Wheelchair/chair at sink Underwear - Performed by patient: Thread/unthread left underwear leg, Pull underwear up/down Underwear - Performed by helper: Thread/unthread right underwear leg Pants- Performed by patient: Thread/unthread left pants leg, Pull pants up/down, Fasten/unfasten pants Pants- Performed by helper: Thread/unthread right pants leg Non-skid slipper socks- Performed by patient: Don/doff right sock, Don/doff left sock Assist for footwear: Supervision/touching assist Assist for lower body dressing: Touching or steadying assistance (Pt > 75%)  Function - Toileting Toileting steps completed by patient: Adjust clothing prior to toileting, Performs perineal hygiene, Adjust clothing after toileting Toileting Assistive Devices: Grab bar or rail Assist level: Touching or steadying assistance (Pt.75%)  Function - Air cabin crew transfer assistive device: Grab bar, Walker Assist level to toilet: Touching or steadying assistance (Pt > 75%) Assist level from toilet: Touching or steadying assistance (Pt > 75%)  Function - Chair/bed transfer Chair/bed transfer method:  Stand pivot Chair/bed transfer assist level: Supervision or verbal cues Chair/bed transfer assistive device: Armrests, Bedrails Chair/bed transfer details: Verbal cues for technique, Verbal cues for precautions/safety  Function - Locomotion: Wheelchair Will patient use wheelchair at discharge?: No Function - Locomotion: Ambulation Assistive device: Walker-standard Max distance: 20 Assist level: Touching or steadying assistance (Pt > 75%) Assist level: Touching or steadying assistance (Pt > 75%) Assist level: Touching or steadying assistance (Pt > 75%) Assist level: Touching or steadying assistance (Pt > 75%) Assist level: Touching or steadying assistance (Pt > 75%)  Function - Comprehension Comprehension: Auditory Comprehension assist level: Follows complex conversation/direction with extra time/assistive device  Function - Expression Expression: Verbal Expression assist level: Expresses basic 50 - 74% of  the time/requires cueing 25 - 49% of the time. Needs to repeat parts of sentences.  Function - Social Interaction Social Interaction assist level: Interacts appropriately 90% of the time - Needs monitoring or encouragement for participation or interaction.  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 DVT Prophylaxis/Anticoagulation: CBC  Normal   3. Pain Management:Tylenol as needed, add gabapentin for dysesthesia 4. Mood:Provide emotional support 5.  Neuropsych: This patientiscapable of making decisions on hisown behalf. 6. Skin/Wound Care:Routine skin checks 7. Fluids/Electrolytes/Nutrition:Routine INO's with follow-up chemistries Intake low 721m, normal BMET 4/22 8.Hypertension. Cozaar 266mdaily. Monitor with increased mobility     Vitals:   03/29/18 1419 03/30/18 0620  BP: 107/68 (!) 91/56  Pulse: 73 62  Resp: 18 16  Temp: 97.8 F (36.6 C) 97.8 F (36.6 C)  SpO2: 98% 100%  Running a little low, reduce cozaar to 12.38m51m.Hyperlipidemia. Lipitor 10.Tobacco abuse.Counseling      LOS (Days) 3 A FACE TO FACE EVALUATION WAS PERFORMED  AndCharlett Blake22/2019, 8:11 AM

## 2018-04-01 NOTE — Progress Notes (Signed)
Meredith Staggers, MD  Physician  Physical Medicine and Rehabilitation  PMR Pre-admission  Signed  Date of Service:  03/29/2018 10:51 AM       Related encounter: ED to Hosp-Admission (Discharged) from 03/26/2018 in [place] (1C)      Signed            Show:Clear all [x] Manual[x] Template[x] Copied  Added by: [x] Retta Diones, RN[x] Meredith Staggers, MD   [] Hover for details     [employer] PMR Admission Coordinator Pre-Admission Assessment  Patient: Cory Harvey is an 57 y.o., male MRN: [id number] DOB: 1961/01/06 Height: 5' 8"  (172.7 cm) Weight: 82.1 kg (181 lb)  Insurance Information HMO:      PPO:       PCP:       IPA:       80/20:       OTHER:  Group # [id number] PRIMARY: [employer] [employer]      Policy#: [id number]      Subscriber: patient CM Name:  [name]      Phone#: [phone]     Fax#: [phone] Pre-Cert#: [id number] with update due in 7 days      Employer:   Benefits:  Phone #: [phone]     Name:  On line Eff. Date: 12/11/17     Deduct:  $0      Out of Pocket Max: [phone] (met $0)      Life Max: N/A CIR: $430 days 1-4      SNF:  $0 days 1-20; $160 days 21-62; $0 days 63-100 Outpatient: medical necessity     Co-Pay: $40/visit Home Health: 100%      Co-Pay: none DME: 80%     Co-Pay: 20% Providers: in network  Medicaid Application Date:        Case Manager:   Disability Application Date:        Case Worker:    Emergency Publishing copy Information    Name Relation Home Work Mobile   [place] [facility] Father [phone]        Current Medical History  Patient Admitting Diagnosis: L CVA  History of Present Illness: A 57 year old right-handed male with history of CVA x4 maintained on aspirin 81 mg daily and Plavix, tobacco abuse, hypertension. Presented to [facility]/26/2018 with right-sided weakness and slurred speech. Per chart review patient lives alone. Reported to be  independent prior to admission and driving. One level home. He has a brother as well as a father in the area that check on him regularly. Cranial CT scan negative. Patient did not receive TPA. MRI showed multiple small acute infarcts in the right cerebral hemisphere, posterior watershed distribution. Advanced chronic small vessel disease with multiple remote lacunar infarctions. Carotid Dopplers with no ICA stenosis. Echocardiogram with ejection fraction of 65%. Systolic function was normal. TEE 03/27/2018 showing no vegetation without thrombus. No defect or PFO. Presently on aspirin 325 mg daily as well as Plavix for CVA prophylaxis. Subcu Lovenox for DVT prophylaxis. Tolerating a regular diet. Physical and occupational therapy evaluations completed. Patient to be admitted for a comprehensive inpatient rehab program.  Patient's medical record from [facility] has been reviewed by the rehabilitation admission coordinator and physician.  NIH Stroke scale:  Past Medical History      Past Medical History:  Diagnosis Date  . Depression   . History of CVA (cerebrovascular accident)   . Hypertension   . Stroke (  [place])    x 4  . Tobacco abuse     Family History   family history includes Diabetes in his father and mother; Heart disease in his mother; Hyperlipidemia in his mother; Hypertension in his father and mother; Stroke in his mother.  Prior Rehab/Hospitalizations Has the patient had major surgery during 100 days prior to admission? No               Current Medications See current MAR from [facility]  Patients Current Diet:   Heart diet, thin liquids  Precautions / Restrictions Precautions Precautions: Fall Restrictions Weight Bearing Restrictions: No   Has the patient had 2 or more falls or a fall with injury in the past year?No  Prior Activity Level Community (5-7x/wk): Went out daily.  Was  driving.  Prior Functional Level Self Care: Did the patient need help bathing, dressing, using the toilet or eating?  Independent  Indoor Mobility: Did the patient need assistance with walking from room to room (with or without device)? Independent  Stairs: Did the patient need assistance with internal or external stairs (with or without device)? Independent  Functional Cognition: Did the patient need help planning regular tasks such as shopping or remembering to take medications? Independent  Home Assistive Devices / Equipment Home Assistive Devices/Equipment: None Home Equipment: Walker - 2 wheels, Other (comment)(has not previously used equipment, spare from family member)  Prior Device Use: Indicate devices/aids used by the patient prior to current illness, exacerbation or injury? None, but has access to a walker, not using it.   Prior Functional Level Current Functional Level  Bed Mobility  Independent  Mod Independent   Transfers  Independent  Min assist(min guard assist)   Mobility - Walk/Wheelchair  Independent  Min assist(min to min guard 160 feet RW)   Upper Body Dressing  Independent  Min assist   Lower Body Dressing  Independent  Min assist   Grooming  Independent  Min assist(min guard assist)   Eating/Drinking  Independent  Min assist   Toilet Transfer  Independent  Min assist   Bladder Continence   WDL  WDL   Bowel Management  WDL  Last BM 03/29/18   Stair Climbing   Other(Not tried)   Consulting civil engineer  Slurred speech   Memory  WDL  Mild impairment   Cooking/Meal Prep  Community education officer  Independent    Driving       Special needs/care consideration BiPAP/CPAP No CPM No Continuous Drip IV No Dialysis No       Life Vest No Oxygen No Special Bed No Trach Size No Wound Vac (area) No     Skin No                              Bowel mgmt: Last BM 03/29/18 Bladder mgmt: WDL Diabetic mgmt No  Previous Home Environment Living Arrangements: Alone  Lives With: Alone Available Help at Discharge: Available PRN/intermittently, Other (Comment)(father, but states father is elderly/frail) Type of Home: House Home Layout: One level Home Access: Stairs to enter Entrance Stairs-Rails: Can reach both Entrance Stairs-Number of Steps: 6 Bathroom Shower/Tub: Multimedia programmer: [place]: No  Discharge Living Setting Plans for Discharge Living Setting: Alone, House(Plans to return home alone with help from father/brother.) Type of Home at Discharge: House Discharge Home Layout: One level  Discharge Home Access: Stairs to enter Entrance Stairs-Number of Steps: 6 steps Does the patient have any problems obtaining your medications?: No  Social/Family/Support Systems Patient Roles: Other (Comment)(Has an elderly father and a brother.) Contact Information: Elester Apodaca - father - [phone] Anticipated Caregiver: Self, father, brother Ability/Limitations of Caregiver: Father is frail and elderly.  Brother has stayed with patient previously Caregiver Availability: Intermittent Discharge Plan Discussed with Primary Caregiver: Yes Is Caregiver In Agreement with Plan?: Yes Does Caregiver/Family have Issues with Lodging/Transportation while Pt is in Rehab?: No  Goals/Additional Needs Patient/Family Goal for Rehab: PT/OT/SLP mod I and supervision goals Expected length of stay: 10-14 days Cultural Considerations: None Dietary Needs: Heart diet, thin liquids Equipment Needs: TBD Pt/Family Agrees to Admission and willing to participate: Yes Program Orientation Provided & Reviewed with Pt/Caregiver Including Roles  & Responsibilities: Yes  Patient Condition: I have reviewed all progress and I have spoken with the patient.  He suffered a L CVA.  He was independent PTA.  He is  currently receiving PT/OT/SLP in the acute hospital setting.  He can tolerate and benefit from 3 hours of therapy a day.  I have shared all information and reviewed with rehab MD and I have approval for acute inpatient rehab admission for today.  Preadmission Screen Completed By:  Retta Diones, 03/29/2018 11:10 AM ______________________________________________________________________   Discussed status with Dr. Naaman Plummer on 03/29/18 at 36 and received telephone approval for admission today.  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 Does the need for close, 24 hr/day  Medical supervision in concert with the patient's rehab needs make it unreasonable for this patient to be served in a less intensive setting? Yes 2. Co-Morbidities requiring supervision/potential complications: HTN, post-stroke sequelae 3. Due to bladder management, bowel management, safety, skin/wound care, disease management, medication administration, pain management and patient education, does the patient require 24 hr/day rehab nursing? Yes 4. Does the patient require coordinated care of a physician, rehab nurse, PT (1-2 hrs/day, 5 days/week), OT (1-2 hrs/day, 5 days/week) and SLP (1-2 hrs/day, 5 days/week) to address physical and functional deficits in the context of the above medical diagnosis(es)? Yes Addressing deficits in the following areas: balance, endurance, locomotion, strength, transferring, bowel/bladder control, bathing, dressing, feeding, grooming, toileting, cognition and psychosocial support 5. Can the patient actively participate in an intensive therapy program of at least 3 hrs of therapy 5 days a week? Yes 6. The potential for patient to make measurable gains while on inpatient rehab is excellent 7. Anticipated functional outcomes upon discharge from inpatients are: modified  independent and supervision PT, modified independent and supervision OT, modified independent and supervision SLP 8. Estimated rehab length of stay to reach the above functional goals is: 10-14 days 9. Does the patient have adequate social supports to accommodate these discharge functional goals? Yes 10. Anticipated D/C setting: Home 11. Anticipated post D/C treatments: [place] therapy 12. Overall Rehab/Functional Prognosis: excellent    RECOMMENDATIONS: This patient's condition is appropriate for continued rehabilitative care in the following setting: CIR Patient has agreed to participate in recommended program. Yes Note that insurance prior authorization may be required for reimbursement for recommended care.    Comment: Admit to inpatient rehab today.   Meredith Staggers, MD, [facility] 03/29/2018   Retta Diones 03/29/2018          Revision History

## 2018-04-01 NOTE — Progress Notes (Signed)
Physical Therapy Session Note  Patient Details  Name: Cory Harvey MRN: [id number] Date of Birth: 1961/10/13  Today's Date: 04/01/2018 PT Individual Time: 1536-1601 PT Individual Time Calculation (min): 25 min   Short Term Goals: Week 1:  PT Short Term Goal 1 (Week 1): Pt will demonstrate appropriate safety awareness during functional mobility 50% of the time PT Short Term Goal 2 (Week 1): Pt will participate in 30 min of upright activity w/o increase in fatigue PT Short Term Goal 3 (Week 1): Pt will maintain dynamic standing balance w/ supervision  Skilled Therapeutic Interventions/Progress Updates:  Pt received in bed & agreeable to tx, no c/o pain reported. Pt ambulates throughout unit without AD & supervision with decreased step length & weight shifting LLE, with decreased gait speed. Pt retrieves laundry from dryer and folds in while standing with supervision in room. Pt reports he plans to d/c home with his brother coming to stay with him to provide supervision. Pt reports having 5 steps to enter front door with B rails and 7 steps to enter back door without rails, pt also has grass to access front door. Pt negotiates 8 steps with B rails with supervision, ramp, and mulch to simulate home environment, no LOB noted. Pt able to path find ortho gym>room with min cuing; pt does not attend to numbers on L side of signs when looking for room. At end of session pt left in bed with alarm set & all needs within reach.  Therapy Documentation Precautions:  Precautions Precautions: Fall Restrictions Weight Bearing Restrictions: No   See Function Navigator for Current Functional Status.   Therapy/Group: Individual Therapy  Sandi MariscalVictoria M Nydia Ytuarte 04/01/2018, 4:07 PM

## 2018-04-01 NOTE — IPOC Note (Addendum)
Overall Plan of Care [facility](IPOC) Patient Details Name: Cory Harvey MRN: [id number] DOB: 07-13-1961  Admitting Diagnosis: <principal problem not specified>  Hospital Problems: Active Problems:   Cerebrovascular accident (CVA) involving right cerebral hemisphere [facility](HCC)     Functional Problem List: Nursing Safety  PT Balance, Perception, Safety, Endurance, Motor  OT Balance, Behavior, Cognition, Perception, Safety, Sensory, Endurance, Motor, Vision  SLP Cognition, Linguistic  TR         Basic ADL's: OT Eating, Grooming, Bathing, Dressing, Toileting     Advanced  ADL's: OT       Transfers: PT Bed Mobility, Bed to Chair, Car, Occupational psychologisturniture  OT Toilet, Research scientist (life sciences)Tub/Shower     Locomotion: PT Stairs, Ambulation     Additional Impairments: OT Fuctional Use of Upper Extremity  SLP        TR      Anticipated Outcomes Item Anticipated Outcome  Self Feeding Mod I  Swallowing      Basic self-care  Supervision  Toileting  Supervision   Bathroom Transfers Supervision  Bowel/Bladder  pt will maintain continence of bowel and bladder  Transfers  supervision  Locomotion  supervision community level gait  Communication  Supervision  Cognition  Supervision   Pain  pt will maintain pain level at < 3  Safety/Judgment  pt will reduce risk of falls and use call bell to call for staff assistance before getting up   Therapy Plan: PT Intensity: Minimum of 1-2 x/day ,45 to 90 minutes PT Frequency: 5 out of 7 days PT Duration Estimated Length of Stay: 10-12 days OT Intensity: Minimum of 1-2 x/day, 45 to 90 minutes OT Frequency: 5 out of 7 days OT Duration/Estimated Length of Stay: 10-12 days SLP Intensity: Minumum of 1-2 x/day, 30 to 90 minutes SLP Frequency: 3 to 5 out of 7 days SLP Duration/Estimated Length of Stay: 10-12 days     Team Interventions: Nursing Interventions Patient/Family Education, Cognitive Remediation/Compensation, Medication Management  PT interventions  Visual/perceptual remediation/compensation, UE/LE Strength taining/ROM, Therapeutic Activities, Splinting/orthotics, Psychosocial support, Pain management, Functional mobility training, DME/adaptive equipment instruction, Discharge planning, Cognitive remediation/compensation, Ambulation/gait training, Warden/rangerBalance/vestibular training, Community reintegration, Disease management/prevention, Functional electrical stimulation, Neuromuscular re-education, Patient/family education, Skin care/wound management, Stair training, Therapeutic Exercise, UE/LE Coordination activities, Wheelchair propulsion/positioning  OT Interventions Warden/rangerBalance/vestibular training, Discharge planning, Functional electrical stimulation, Pain management, Self Care/advanced ADL retraining, Therapeutic Activities, UE/LE Coordination activities, Visual/perceptual remediation/compensation, Therapeutic Exercise, Skin care/wound managment, Patient/family education, Functional mobility training, Disease mangement/prevention, Cognitive remediation/compensation, DME/adaptive equipment instruction, Neuromuscular re-education, Psychosocial support, Splinting/orthotics, UE/LE Strength taining/ROM  SLP Interventions Cognitive remediation/compensation, Cueing hierarchy, Internal/external aids, Environmental controls, Speech/Language facilitation, Functional tasks, Patient/family education, Therapeutic Activities  TR Interventions    SW/CM Interventions  Psychosocial Assessment, Discharge Planning & Pt & Family Education   Barriers to Discharge MD  Medical stability  Nursing Medical stability, Home environment access/layout, Decreased caregiver support, Lack of/limited family support history of CVA, history of falls, lives alone  PT Decreased caregiver support lives alone  OT Decreased caregiver support Recommend 24 hr supervision assist at d/c, pt lived alone PTA  SLP      SW       Team Discharge Planning: Destination: PT-Home ,OT- Home ,  SLP-Home Projected Follow-up: PT-Home health PT, OT-  Home health OT, 24 hour supervision/assistance, SLP-24 hour supervision/assistance, Home Health SLP Projected Equipment Needs: PT-To be determined, OT- To be determined, SLP-None recommended by SLP Equipment Details: PT- , OT-  Patient/family involved in discharge planning: PT- Patient,  OT-Patient, SLP-Patient  MD ELOS: 7-10d Medical Rehab Prognosis:  Excellent Assessment:   57 year old right-handed male with history of CVA x4 maintained on aspirin 81 mg daily and Plavix, tobacco abuse, hypertension. Presented to [facility]/26/2018 with right-sided weakness and slurred speech. Per chart review patient lives alone. Reported to be independent prior to admission and driving. One level home. He has a brother as well as a father in the area that check on him regularly. Cranial CT scan negative. Patient did not receive TPA. MRI showed multiple small acute infarcts in the right cerebral hemisphere, posterior watershed distribution. Advanced chronic small vessel disease with multiple remote lacunar infarctions. Carotid Dopplers with no ICA stenosis. Echocardiogram with ejection fraction of 65%. Systolic function was normal. TEE 03/27/2018 showing no vegetation without thrombus. No defect or PFO. Presently on aspirin 325 mg daily as well as Plavix for CVA prophylaxis. Subcu Lovenox for DVT prophylaxis.    Now requiring 24/7 Rehab RN,MD, as well as CIR level PT, OT and SLP.  Treatment team will focus on ADLs and mobility with goals set at Sup See Team Conference Notes for weekly updates to the plan of care

## 2018-04-01 NOTE — Progress Notes (Signed)
Patient information reviewed and entered into eRehab system by Jarrett Chicoine, RN, CRRN, PPS Coordinator.  Information including medical coding and functional independence measure will be reviewed and updated through discharge.     Per nursing patient was given "Data Collection Information Summary for Patients in Inpatient Rehabilitation Facilities with attached "Privacy Act Statement-Health Care Records" upon admission.  Present in education notebook.  

## 2018-04-01 NOTE — Care Management Note (Signed)
Inpatient Rehabilitation Center Individual Statement of Services  Patient Name:  Cory Harvey  Date:  04/01/2018  Welcome to the Inpatient Rehabilitation Center.  Our goal is to provide you with an individualized program based on your diagnosis and situation, designed to meet your specific needs.  With this comprehensive rehabilitation program, you will be expected to participate in at least 3 hours of rehabilitation therapies Monday-Friday, with modified therapy programming on the weekends.  Your rehabilitation program will include the following services:  Physical Therapy (PT), Occupational Therapy (OT), Speech Therapy (ST), 24 hour per day rehabilitation nursing, Therapeutic Recreaction (TR), Neuropsychology, Case Management (Social Worker), Rehabilitation Medicine, Nutrition Services and Pharmacy Services  Weekly team conferences will be held on Wednesday to discuss your progress.  Your Social Worker will talk with you frequently to get your input and to update you on team discussions.  Team conferences with you and your family in attendance may also be held.  Expected length of stay: 10-12 days  Overall anticipated outcome: supervision-mod/i level  Depending on your progress and recovery, your program may change. Your Social Worker will coordinate services and will keep you informed of any changes. Your Social Worker's name and contact numbers are listed  below.  The following services may also be recommended but are not provided by the Inpatient Rehabilitation Center:   Driving Evaluations  Home Health Rehabiltiation Services  Outpatient Rehabilitation Services  Vocational Rehabilitation   Arrangements will be made to provide these services after discharge if needed.  Arrangements include referral to agencies that provide these services.  Your insurance has been verified to be:  [employer]-Medicare Your primary doctor is:  Consulting civil engineerLinthavong  Pertinent information will be shared with your  doctor and your insurance company.  Social Worker:  Dossie DerBecky Kalii Chesmore, SW [phone] or ([phone]) [phone]  Information discussed with and copy given to patient by: Lucy Chrisupree, Edmonia Gonser G, 04/01/2018, 9:53 AM

## 2018-04-01 NOTE — Progress Notes (Signed)
Occupational Therapy Session Note  Patient Details  Name: Cory Harvey MRN: [id number] Date of Birth: 1960/12/22  Today's Date: 04/01/2018 OT Individual Time: 1300-1345 OT Individual Time Calculation (min): 45 min    Short Term Goals: Week 1:  OT Short Term Goal 1 (Week 1): Pt will correctly manage LRAD during functional ambulation with min cuing OT Short Term Goal 2 (Week 1): Pt will complete LB dressing with CGA OT Short Term Goal 3 (Week 1): Pt will complete 2 grooming tasks standing at sink with supervision in order to increase functional activity tolerance  Skilled Therapeutic Interventions/Progress Updates:    Pt seen for OT session focusing on functional ambulation, orientation, dynamic standing balance and functional activity tolerance. Pt in supine upon arrival, agreeable to tx session. He declined showering activity, however, with encouragement pt willing to change into paper scrubs so personal clothes could be washed. He dressed seated EOB, initially with difficulty threading LEs, however, able to problem solve to complete task without cuing or physical assist. He ambulated throughout session without AD and close supervision- CGA. He gathered dirty laundry items from floor with guarding assist. Required increased cuing for path finding to pt laundry facility due to poor R/L discrimination. [facility] with cuing for working machine. In BI gym, pt completed Dyn[employer] activity focusing on Attention to L, dynamic standing balance, and functional activity tolerance. Completed x4 [name] of activity, pt required to cross midline and outside BOS in order to hit targets. Slightly increased time to locate targets L of midline. One trial completed standing on foam non-compliant surface for LE strengthening and balance- CGA required. Attempted to complete trial of sit<> stand btwn each repetition, however, pt with difficulty following directions for multi-step task and  therefore task was stopped. Pt returned to room, completed grooming tasks standing at sink and toileting task from standing position with cuing for positioning and awareness. He removed lunch tray from room and returned to meal cart requiring cuing for problem solving of how tray to fit. Pt returned to room and with encouragement willing to stay sitting up in recliner. QRB and chair alarm on, all needs in reach.   Therapy Documentation Precautions:  Precautions Precautions: Fall Restrictions Weight Bearing Restrictions: No Pain:   No/denies pain  See Function Navigator for Current Functional Status.   Therapy/Group: Individual Therapy  Elmarie Devlin L 04/01/2018, 7:15 AM

## 2018-04-01 NOTE — Progress Notes (Signed)
Occupational Therapy Session Note  Patient Details  Name: Cory Harvey MRN: [id number] Date of Birth: [birth year]  Today's Date: 04/01/2018 OT Individual Time: 1036-1100 OT Individual Time Calculation (min): 24 min    Short Term Goals: Week 1:  OT Short Term Goal 1 (Week 1): Pt will correctly manage LRAD during functional ambulation with min cuing OT Short Term Goal 2 (Week 1): Pt will complete LB dressing with CGA OT Short Term Goal 3 (Week 1): Pt will complete 2 grooming tasks standing at sink with supervision in order to increase functional activity tolerance  Skilled Therapeutic Interventions/Progress Updates:    Pt received supine in bed with c/o fatigue but agreeable to therapy. Pt required min A to complete stand pivot transfer from bed to w/c, with vc/tactile cues provided for activity pacing and to problem solve through transfer technique. Pt completed 250 ft of w/c mobility, with vc for navigating around several obstacles and occasional manual facilitation required for maneuvering. Demo and edu provided re bimanual arm swing during propulsion. Pt returned to room and transferred back to bed with all needs met and bed alarm set.   Therapy Documentation Precautions:  Precautions Precautions: Fall Restrictions Weight Bearing Restrictions: No   Vital Signs: Therapy Vitals Temp: 98.7 F (37.1 C) Temp Source: Oral Pulse Rate: 68 Resp: 17 BP: 112/74 Patient Position (if appropriate): Lying Oxygen Therapy SpO2: 99 % O2 Device: Room Air Pain: Pain Assessment Pain Scale: 0-10 Pain Score: 0-No pain  See Function Navigator for Current Functional Status.   Therapy/Group: Individual Therapy  Curtis Sites 04/01/2018, 3:36 PM

## 2018-04-01 NOTE — Progress Notes (Signed)
Physical Therapy Session Note  Patient Details  Name: Cory Harvey MRN: [id number] Date of Birth: [birth year]  Today's Date: 04/01/2018 PT Individual Time: 0830-0900 PT Individual Time Calculation (min): 30 min   Short Term Goals: Week 1:  PT Short Term Goal 1 (Week 1): Pt will demonstrate appropriate safety awareness during functional mobility 50% of the time PT Short Term Goal 2 (Week 1): Pt will participate in 30 min of upright activity w/o increase in fatigue PT Short Term Goal 3 (Week 1): Pt will maintain dynamic standing balance w/ supervision  Skilled Therapeutic Interventions/Progress Updates: Pt received seated in w/c, denies pain but reports fatigue and not sleeping well at night, agreeable to treatment with encouragement. W/c propulsion x100' for focus on BUE coordination; requires modA and mod cues for technique d/t L hemiparesis. Pt self-selects to use BLEs for propulsion while resting UEs. Stand pivot transfer w/c <>mat table with S. Therapist obtained smaller w/c and cushion for more appropriate fit to increase sitting tolerance and improve postural alignment. Seated BUE pipe tree performed for focus on LUE NMR, attention, problem solving; requires mod cues for technique and problem solving with pt able to correctly retrieve pieces but difficulty with sequencing when putting pieces together. Remained seated in w/c at end of session, quick release belt intact and all needs in reach, RN present.       Therapy Documentation Precautions:  Precautions Precautions: Fall Restrictions Weight Bearing Restrictions: No   See Function Navigator for Current Functional Status.   Therapy/Group: Individual Therapy  Vista Lawmanlizabeth J Tygielski 04/01/2018, 9:46 AM

## 2018-04-01 NOTE — Progress Notes (Signed)
Speech Language Pathology Daily Session Note  Patient Details  Name: Cory Harvey MRN: [id number] Date of Birth: [birth year]  Today's Date: 04/01/2018 SLP Individual Time: 0730-0830 SLP Individual Time Calculation (min): 60 min  Short Term Goals: Week 1: SLP Short Term Goal 1 (Week 1): Patient will demonstrate functional problem solving for basic and familiar tasks with Min A verbal cues. SLP Short Term Goal 2 (Week 1): Patient will utilize external memory aids to recall new, daily information with Min A verbal cues.  SLP Short Term Goal 3 (Week 1): Patient will demonstrate selective attention to functional tasks in a mildly distracting enviornment for ~30 minutes with Min A verbal cues for redirection.  SLP Short Term Goal 4 (Week 1): Patient will self-monitor and correct errors during functional tasks with Min A verbal cues.  SLP Short Term Goal 5 (Week 1): Patient will utilize a slow rate and over-articulation to maximize intelligibility to 90% at the conversation level with Min A verbal cues.  SLP Short Term Goal 6 (Week 1): Patient will utilize word-fniding strategies at the conversation level with Min A verbal cues.   Skilled Therapeutic Interventions: Skilled treatment session focused on cognition goals. SLP facilitated session by providing Min A cues to organize semi-complex medication management task. Pt with specific difficulty organizing and sequencing pills that were applied twice daily etc. Pt was returned to room, left upright in wheelchair, safety belt donned and all needs within reach. Continue per current plan of care.      Function:    Cognition Comprehension Comprehension assist level: Follows complex conversation/direction with extra time/assistive device;Understands complex 90% of the time/cues 10% of the time  Expression   Expression assist level: Expresses basic 75 - 89% of the time/requires cueing 10 - 24% of the time. Needs helper to occlude trach/needs to  repeat words.  Social Interaction    Problem Solving Problem solving assist level: Solves basic 75 - 89% of the time/requires cueing 10 - 24% of the time  Memory Memory assist level: Recognizes or recalls 50 - 74% of the time/requires cueing 25 - 49% of the time    Pain    Therapy/Group: Individual Therapy  Simar Pothier 04/01/2018, 9:46 AM

## 2018-04-01 NOTE — Progress Notes (Signed)
Social Work  Social Work Assessment and Plan  Patient Details  Name: Cory Harvey MRN: [id number] Date of Birth: [birth year]  Today's Date: 04/01/2018  Problem List:  Patient Active Problem List   Diagnosis Date Noted  . Cerebrovascular accident (CVA) involving right cerebral hemisphere (HCC) 03/29/2018  . CVA (cerebral vascular accident) (HCC) 03/26/2018  . Acute CVA (cerebrovascular accident) (HCC) 09/02/2017  . Gait disturbance 09/02/2017  . History of CVA (cerebrovascular accident) 04/18/2017  . Tobacco use 04/18/2017  . Depression   . Hypertension   . Stroke [facility](HCC)    Past Medical History:  Past Medical History:  Diagnosis Date  . Depression   . History of CVA (cerebrovascular accident)   . Hypertension   . Stroke (HCC)    x 4  . Tobacco abuse    Past Surgical History:  Past Surgical History:  Procedure Laterality Date  . kidney surgery Left 1983  . TEE WITHOUT CARDIOVERSION N/A 03/27/2018   Procedure: TRANSESOPHAGEAL ECHOCARDIOGRAM (TEE);  Surgeon: Dalia HeadingFath, Kenneth A, MD;  Location: [facility] ORS;  Service: Cardiovascular;  Laterality: N/A;   Social History:  reports that he has been smoking cigarettes.  He has been smoking about 0.50 packs per day. He has never used smokeless tobacco. He reports that he drinks alcohol. He reports that he does not use drugs.  Family / Support Systems Marital Status: Single Patient Roles: Other (Comment)(has elderly parents and brother) Other Supports: [name] [phone]-cell  Brother who stays with him but works Anticipated Caregiver: Self and family Ability/Limitations of Caregiver: Father and Mother are elderly and can not physically assist, brother works during the day Caregiver Availability: Intermittent Family Dynamics: Close with his brother and parents. They all pull together and try to do what the other needs. Both of his parents are elderly and have health issues. His brother stays with him but works during the day. Pt needs  to be mod/i to be safe home alone  Social History Preferred language: English Religion: None Cultural Background: No issues Education: High School Read: Yes Write: Yes Employment Status: Disabled Fish farm managerLegal Hisotry/Current Legal Issues: No issues Guardian/Conservator: None-according to MD pt is capable of making his own decisions while here   Abuse/Neglect Abuse/Neglect Assessment Can Be Completed: Yes Physical Abuse: Denies Verbal Abuse: Denies Sexual Abuse: Denies Exploitation of patient/patient's resources: Denies Self-Neglect: Denies  Emotional Status Pt's affect, behavior adn adjustment status: Pt is worried about paying his water bill that is due he would like to leave, but does recoginize his weakness and balance issues. Discussed if his brother or his dad coud go and pay this bill. He will check with them. he voiced this is his fifth stroke but is the worse one.  Recent Psychosocial Issues: past history of CVA's but did not impede him. He was working for a Restaurant manager, fast foodmedical waste center Pyschiatric History: No history deferred depression screen due to coping appropriately and discussing his feelings and concerns. Due to multiple strokes he would benefit from seeing neuro-psych while here. Substance Abuse History: Tobacco aware needs to quit and may this time due to has not had any while in the hospital. He is aware of the resources available to him.   Patient / Family Perceptions, Expectations & Goals Pt/Family understanding of illness & functional limitations: Pt does talk with the MD and feels he has a basic understanding of his deficits. He does wonder why he keeps having them and wants to answer to this. He plans to ask the MD tomorrow while he  is rounding. Premorbid pt/family roles/activities: Brother, son, friend, etc Anticipated changes in roles/activities/participation: resume Pt/family expectations/goals: Pt states : "I want to be stronger and have better balance. I will mange I have  too."  Dad states: " I hope he does well here."  [employer]: None Premorbid Home Care/DME Agencies: None Transportation available at discharge: Father provides transportation due to pt's car is broken Resource referrals recommended: Neuropsychology, Support group (specify)  Discharge Planning Living Arrangements: Other relatives Support Systems: Parent, Other relatives Type of Residence: Private residence Insurance Resources: Media planner (specify)([employer]HC-Medicare) Surveyor, quantity Resources: Employment, Actuary Screen Referred: No Living Expenses: Psychologist, sport and exercise Management: Patient Does the patient have any problems obtaining your medications?: No Home Management: Patient Patient/Family Preliminary Plans: Return home with his brother who is there in the evenings. He needs to be mod/i before going home to be safe there alone. His father will continue to provide transportation. He is worried about his water bill will see if a family member can go and pay it for him, Sw Barriers to Discharge: Decreased caregiver support Sw Barriers to Discharge Comments: Does not have 24 hr care Social Work Anticipated Follow Up Needs: HH/OP, Support Group  Clinical Impression Hard working gentleman who just wants to get stronger and back home to be able to take care of his bills. His father and brother can assist some, but no one can provide 24 hr supervision. Pt has had multiple strokes and recovered form them and he is hopeful again but does report this one is worse than the others. Will see if family members can pay his water bill which is concerning him and work on a safe discharge plan.  Lucy Chris 04/01/2018, 10:08 AM

## 2018-04-02 ENCOUNTER — Inpatient Hospital Stay (HOSPITAL_COMMUNITY): Payer: Medicare Other

## 2018-04-02 ENCOUNTER — Inpatient Hospital Stay (HOSPITAL_COMMUNITY): Payer: Medicare Other | Admitting: Physical Therapy

## 2018-04-02 MED ORDER — TRAZODONE HCL 50 MG PO TABS
50.0000 mg | ORAL_TABLET | Freq: Every evening | ORAL | Status: DC | PRN
  Administered 2018-04-02 – 2018-04-03 (×2): 50 mg via ORAL
  Filled 2018-04-02 (×2): qty 1

## 2018-04-02 NOTE — Progress Notes (Signed)
Subjective/Complaints: States he didn't sleep that well. Restless this morning. Denies pain.   ROS: Limited due to cognitive/behavioral    Objective: Vital Signs: Blood pressure 128/87, pulse 79, temperature 98.4 F (36.9 C), temperature source Oral, resp. rate 18, height 5' 8"  (1.727 m), weight 79 kg (174 lb 2.6 oz), SpO2 98 %. No results found. Results for orders placed or performed during the hospital encounter of 03/29/18 (from the past 72 hour(s))  CBC WITH DIFFERENTIAL     Status: Abnormal   Collection Time: 04/01/18  6:17 AM  Result Value Ref Range   WBC 6.6 4.0 - 10.5 K/uL   RBC 5.47 4.22 - 5.81 MIL/uL   Hemoglobin 15.7 13.0 - 17.0 g/dL   HCT 46.7 39.0 - 52.0 %   MCV 85.4 78.0 - 100.0 fL   MCH 28.7 26.0 - 34.0 pg   MCHC 33.6 30.0 - 36.0 g/dL   RDW 14.8 11.5 - 15.5 %   Platelets 427 (H) 150 - 400 K/uL   Neutrophils Relative % 69 %   Neutro Abs 4.5 1.7 - 7.7 K/uL   Lymphocytes Relative 21 %   Lymphs Abs 1.4 0.7 - 4.0 K/uL   Monocytes Relative 6 %   Monocytes Absolute 0.4 0.1 - 1.0 K/uL   Eosinophils Relative 3 %   Eosinophils Absolute 0.2 0.0 - 0.7 K/uL   Basophils Relative 1 %   Basophils Absolute 0.0 0.0 - 0.1 K/uL    Comment: Performed at [facility], [address] [place], [place] [address]  Comprehensive metabolic panel     Status: Abnormal   Collection Time: 04/01/18  6:17 AM  Result Value Ref Range   Sodium 137 135 - 145 mmol/L   Potassium 4.0 3.5 - 5.1 mmol/L   Chloride 106 101 - 111 mmol/L   CO2 25 22 - 32 mmol/L   Glucose, Bld 95 65 - 99 mg/dL   BUN 10 6 - 20 mg/dL   Creatinine, Ser 1.04 0.61 - 1.24 mg/dL   Calcium 9.2 8.9 - 10.3 mg/dL   Total Protein 6.1 (L) 6.5 - 8.1 g/dL   Albumin 3.5 3.5 - 5.0 g/dL   AST 23 15 - 41 U/L   ALT 30 17 - 63 U/L   Alkaline Phosphatase 49 38 - 126 U/L   Total Bilirubin 0.6 0.3 - 1.2 mg/dL   GFR calc non Af Amer >60 >60 mL/min   GFR calc Af Amer >60 >60 mL/min    Comment: (NOTE) The eGFR has been  calculated using the CKD EPI equation. This calculation has not been validated in all clinical situations. eGFR's persistently <60 mL/min signify possible Chronic Kidney Disease.    Anion gap 6 5 - 15    Comment: Performed at [facility] [address] [place], [place] [address]     Constitutional: No distress . Vital signs reviewed. HEENT: EOMI, oral membranes moist Cardiovascular: RRR without murmur. No JVD    Respiratory: CTA Bilaterally without wheezes or rales. Normal effort    GI: BS +, non-tender, non-distended  Skin:   Intact Neuro: Confused, restless and distracted, can follow basic commands.  Musc/Skel:  Other No pain with UE or LE ROM Gen NAD 4/5 RUE and RLE 5/5 left side--stable Psych: impulsive  Assessment/Plan: 1. 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  Physiatrist is providing close team supervision and 24 hour management of active medical problems listed below. Physiatrist and rehab team continue to assess barriers to discharge/monitor patient progress toward functional and medical goals. FIM: Function - Bathing Position: Standing at sink Body parts bathed by patient: Right arm, Right lower leg, Left arm, Left lower leg, Chest, Abdomen, Front perineal area, Buttocks, Right upper leg, Left upper leg Body parts bathed by helper: Back Assist Level: Supervision or verbal cues  Function- Upper Body Dressing/Undressing What is the patient wearing?: Pull over shirt/dress Pull over shirt/dress - Perfomed by patient: Thread/unthread right sleeve, Thread/unthread left sleeve, Put head through opening, Pull shirt over trunk Assist Level: Supervision or verbal cues Function - Lower Body Dressing/Undressing What is the patient  wearing?: Pants, Non-skid slipper socks Position: Sitting EOB Underwear - Performed by patient: Thread/unthread left underwear leg, Pull underwear up/down Underwear - Performed by helper: Thread/unthread right underwear leg Pants- Performed by patient: Thread/unthread left pants leg, Pull pants up/down, Fasten/unfasten pants Pants- Performed by helper: Thread/unthread right pants leg, Thread/unthread left pants leg, Pull pants up/down Non-skid slipper socks- Performed by patient: Don/doff right sock, Don/doff left sock Assist for footwear: Supervision/touching assist Assist for lower body dressing: Supervision or verbal cues  Function - Toileting Toileting activity did not occur: No continent bowel/bladder event Toileting steps completed by patient: Adjust clothing prior to toileting, Performs perineal hygiene, Adjust clothing after toileting Toileting Assistive Devices: Grab bar or rail Assist level: Supervision or verbal cues  Function Midwife transfer assistive device: Grab bar Assist level to toilet: Supervision or verbal cues Assist level from toilet: Supervision or verbal cues  Function - Chair/bed transfer Chair/bed transfer method: Ambulatory Chair/bed transfer assist level: Supervision or verbal cues Chair/bed transfer assistive device: Armrests Chair/bed transfer details: Verbal cues for technique, Verbal cues for precautions/safety  Function - Locomotion: Wheelchair Will patient use wheelchair at discharge?: No Max wheelchair distance: 250' Assist Level: Supervision or verbal cues Assist Level: Moderate assistance (Pt 50 - 74%) Assist Level: Supervision or verbal cues Turns around,maneuvers to table,bed, and toilet,negotiates 3% grade,maneuvers on rugs and over doorsills: No Function - Locomotion: Ambulation Assistive device: No device Max distance: 150 ft  Assist level: Supervision or verbal cues Assist level: Supervision or verbal cues Assist  level: Supervision or verbal cues Assist level: Supervision or verbal cues Assist level: Supervision or verbal cues  Function - Comprehension Comprehension: Auditory Comprehension assist level: Understands complex 90% of the time/cues 10% of the time  Function - Expression Expression: Verbal Expression assist level: Expresses basic 75 - 89% of the time/requires cueing 10 - 24% of the time. Needs helper to occlude trach/needs to repeat words.  Function - Social Interaction Social Interaction assist level: Interacts appropriately 90% of the time - Needs monitoring or encouragement for participation or interaction.  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 DVT Prophylaxis/Anticoagulation: CBC  Normal    3. Pain Management:Tylenol as needed, add gabapentin for dysesthesia 4. Mood:Provide emotional support 5. Neuropsych: This patientiscapable of making decisions on hisown behalf. 6. Skin/Wound Care:Routine skin checks 7. Fluids/Electrolytes/Nutrition:continue Routine I's and O's    Intake low 523m, normal BMET 4/22   -continue to push PO   -cognitive component to intake 8.Hypertension. Cozaar 287mdaily. Monitor with increased mobility     Vitals:   03/29/18 1419 03/30/18 0620  BP: 107/68 (!) 91/56  Pulse: 73 62  Resp: 18 16  Temp: 97.8 F  (36.6 C) 97.8 F (36.6 C)  SpO2: 98% 100%  bp's still a little soft---hold cozaar for now 9.Hyperlipidemia. Lipitor 10.Tobacco abuse.Counseling      LOS (Days) 4 A FACE TO FACE EVALUATION WAS PERFORMED  ZaMeredith Staggers/23/2019, 1:44 PM

## 2018-04-02 NOTE — Progress Notes (Addendum)
Physical Therapy Session Note  Patient Details  Name: Cory Harvey MRN: [id number] Date of Birth: 1961/09/24  Today's Date: 04/02/2018 PT Individual Time: 1459-1556 PT Individual Time Calculation (min): 57 min   Short Term Goals: Week 1:  PT Short Term Goal 1 (Week 1): Pt will demonstrate appropriate safety awareness during functional mobility 50% of the time PT Short Term Goal 2 (Week 1): Pt will participate in 30 min of upright activity w/o increase in fatigue PT Short Term Goal 3 (Week 1): Pt will maintain dynamic standing balance w/ supervision  Skilled Therapeutic Interventions/Progress Updates:  Pt received in bed & agreeable to tx, no c/o pain. Pt ambulates around unit without AD & supervision with cuing to increase heel strike BLE with fair/poor return demo. Pt continues to exhibit shuffled gait with decreased weight shifting and BLE toe clearance. Pt utilized cybex kinetron in sitting then standing without BUE support but min assist for balance with task focusing on BLE strengthening, weight shifting L<>R, and dynamic balance. Pt completes floor transfer with supervision assist. On mat table pt assumes quadruped position and lifts 1 UE or 1 LE at a time then attempts bird dog position for core strengthening & balance but pt with difficulty extending LE during this activity. Pt completes sit<>stand transfers without UE support while standing on compliant surface with task focusing on BLE strengthening & balance; pt requires occasional cuing for anterior weight shifting. At end of session pt left sitting in recliner with BLE elevated, chair alarm & quick release belt donned, with all needs within reach.   Pt is somewhat impulsive throughout session, attempting to get up without assistance, and also has difficulty following instructions during various tasks.   Therapy Documentation Precautions:  Precautions Precautions: Fall Restrictions Weight Bearing Restrictions: No   See  Function Navigator for Current Functional Status.   Therapy/Group: Individual Therapy  Cory Harvey 04/02/2018, 4:05 PM

## 2018-04-02 NOTE — Progress Notes (Signed)
Speech Language Pathology Daily Session Note  Patient Details  Name: Cory Harvey MRN: [id number] Date of Birth: [birth year]  Today's Date: 04/02/2018 SLP Individual Time: 1030-1100 SLP Individual Time Calculation (min): 30 min  Short Term Goals: Week 1: SLP Short Term Goal 1 (Week 1): Patient will demonstrate functional problem solving for basic and familiar tasks with Min A verbal cues. SLP Short Term Goal 2 (Week 1): Patient will utilize external memory aids to recall new, daily information with Min A verbal cues.  SLP Short Term Goal 3 (Week 1): Patient will demonstrate selective attention to functional tasks in a mildly distracting enviornment for ~30 minutes with Min A verbal cues for redirection.  SLP Short Term Goal 4 (Week 1): Patient will self-monitor and correct errors during functional tasks with Min A verbal cues.  SLP Short Term Goal 5 (Week 1): Patient will utilize a slow rate and over-articulation to maximize intelligibility to 90% at the conversation level with Min A verbal cues.  SLP Short Term Goal 6 (Week 1): Patient will utilize word-fniding strategies at the conversation level with Min A verbal cues.   Skilled Therapeutic Interventions:Skilled ST services focused on cognitive skills. SLP facilitated recall of medication management, SLP was unable to locate medication list from a pervious session and rewrote list.Pt was unable to recall medication taken before hospital admission, but admitted he did not always take his medication and he believed the medication might of caused his current stroke. SLP provided through education about the importance of taking medication consisently and what each medication does in order to prevent further strokes from occurring, pt stated understanding however will require continued education. Pt recalled medication name, function and times per day utilizing visual aid, medication list with mod-min A verbal cues. Pt demonstrated problem solving  filling pill organizer with min A verbal cues, however unable to complete due to limited time, will continue in future sessions.. Pt was left in room with call bell within reach. Reccomend to continue skilled ST services.      Function:  Eating Eating                 Cognition Comprehension Comprehension assist level: Understands complex 90% of the time/cues 10% of the time  Expression   Expression assist level: Expresses basic 75 - 89% of the time/requires cueing 10 - 24% of the time. Needs helper to occlude trach/needs to repeat words.  Social Interaction Social Interaction assist level: Interacts appropriately 90% of the time - Needs monitoring or encouragement for participation or interaction.  Problem Solving Problem solving assist level: Solves basic 75 - 89% of the time/requires cueing 10 - 24% of the time  Memory Memory assist level: Recognizes or recalls 75 - 89% of the time/requires cueing 10 - 24% of the time;Recognizes or recalls 50 - 74% of the time/requires cueing 25 - 49% of the time    Pain Pain Assessment Pain Scale: 0-10 Pain Score: 0-No pain  Therapy/Group: Individual Therapy  Cory Harvey  [facility] 04/02/2018, 12:25 PM

## 2018-04-02 NOTE — Progress Notes (Signed)
Occupational Therapy Session Note  Patient Details  Name: Cory Harvey MRN: [id number] Date of Birth: 1961-01-09  Today's Date: 04/02/2018 OT Individual Time: 3388-2666 OT Individual Time Calculation (min): 57 min    Short Term Goals: Week 1:  OT Short Term Goal 1 (Week 1): Pt will correctly manage LRAD during functional ambulation with min cuing OT Short Term Goal 2 (Week 1): Pt will complete LB dressing with CGA OT Short Term Goal 3 (Week 1): Pt will complete 2 grooming tasks standing at sink with supervision in order to increase functional activity tolerance  Skilled Therapeutic Interventions/Progress Updates:    Pt received supine in bed agreeable to therapy with no c/o pain. Session focused on functional mobility during ADL routine, standing balance, and safety awareness. Pt ambulated to bathroom with close (S) and completed all steps of toileting with (S). Pt educated re safety awareness and fall prevention in the bathroom, including use of grab bars. Pt completed bathing standing at sink with close (S) for dynamic balance when reaching outside of BOS. Pt frequently required vc for environment/self- awareness. Pt completed IADL simulation task, retrieving items from ground level and putting into appropriate receptacle. Pt performed bed making activity in standing, requiring vc for positioning and body mechanics. Pt left supine in bed with bed alarm set and all needs met.   Therapy Documentation Precautions:  Precautions Precautions: Fall Restrictions Weight Bearing Restrictions: No   Vital Signs: Therapy Vitals Temp: 98 F (36.7 C) Temp Source: Oral Pulse Rate: 64 Resp: 16 BP: (!) 130/91 Patient Position (if appropriate): Sitting Oxygen Therapy SpO2: 99 % O2 Device: Room Air Pain: Pain Assessment Pain Scale: 0-10 Pain Score: 0-No pain  See Function Navigator for Current Functional Status.   Therapy/Group: Individual Therapy  Curtis Sites 04/02/2018, 8:30  AM

## 2018-04-02 NOTE — Progress Notes (Signed)
Physical Therapy Session Note  Patient Details  Name: Renton Berkley MRN: [id number] Date of Birth: 02-May-1961  Today's Date: 04/02/2018 PT Individual Time: 0915-1000 PT Individual Time Calculation (min): 45 min   Short Term Goals: Week 1:  PT Short Term Goal 1 (Week 1): Pt will demonstrate appropriate safety awareness during functional mobility 50% of the time PT Short Term Goal 2 (Week 1): Pt will participate in 30 min of upright activity w/o increase in fatigue PT Short Term Goal 3 (Week 1): Pt will maintain dynamic standing balance w/ supervision  Skilled Therapeutic Interventions/Progress Updates:   Pt in supine and agreeable to therapy, no c/o pain. Pt ambulated around unit in 75-150' bouts w/ supervision to work on endurance w/ gait. Performed NuStep 5 min @ level 2 for LE strengthening in reciprocal movement pattern. Performed dynamic standing balance tasks while standing on compliant surface to work on functional standing balance for tasks requiring pt to stand at sink, table, or counter top. Tasks in standing including cognitive tasks requiring pt to plan and sequence task, min-mod cues to complete task successfully. Close supervision in standing for safety. Returned to room and ended session in supine, call bell within reach and all needs met.   Therapy Documentation Precautions:  Precautions Precautions: Fall Restrictions Weight Bearing Restrictions: No Pain: Pain Assessment Pain Scale: 0-10 Pain Score: 0-No pain  See Function Navigator for Current Functional Status.   Therapy/Group: Individual Therapy  Millette Halberstam K Arnette 04/02/2018, 10:24 AM

## 2018-04-03 ENCOUNTER — Inpatient Hospital Stay (HOSPITAL_COMMUNITY): Payer: Medicare Other

## 2018-04-03 ENCOUNTER — Inpatient Hospital Stay (HOSPITAL_COMMUNITY): Payer: Medicare Other | Admitting: Occupational Therapy

## 2018-04-03 ENCOUNTER — Inpatient Hospital Stay (HOSPITAL_COMMUNITY): Payer: Medicare Other | Admitting: Physical Therapy

## 2018-04-03 ENCOUNTER — Encounter (HOSPITAL_COMMUNITY): Payer: Medicare Other | Admitting: Psychology

## 2018-04-03 MED ORDER — LOSARTAN POTASSIUM 50 MG PO TABS
25.0000 mg | ORAL_TABLET | Freq: Every day | ORAL | Status: DC
  Administered 2018-04-03 – 2018-04-04 (×2): 25 mg via ORAL
  Filled 2018-04-03 (×2): qty 1

## 2018-04-03 NOTE — Progress Notes (Addendum)
Social Work Patient ID: Saqib Cazarez, male   DOB: 11-17-1961, 57 y.o.   MRN: [id number]  Met with pt and left a message for Dad regarding team conference goals of supervision and discharge tomrrow. Pt is happy about this and ready to go to Dad's home. Awaiting Dad to return call regarding coming for education for an hour tomorrow prior to discharge home. Dad called worker back he can be here tomorrow at 10:00 am

## 2018-04-03 NOTE — Progress Notes (Signed)
Speech Language Pathology Daily Session Note  Patient Details  Name: Cory Harvey MRN: [id number] Date of Birth: Sep 24, 1961  Today's Date: 04/03/2018 SLP Individual Time: 0900-0930 SLP Individual Time Calculation (min): 30 min  Short Term Goals: Week 1: SLP Short Term Goal 1 (Week 1): Patient will demonstrate functional problem solving for basic and familiar tasks with Min A verbal cues. SLP Short Term Goal 2 (Week 1): Patient will utilize external memory aids to recall new, daily information with Min A verbal cues.  SLP Short Term Goal 3 (Week 1): Patient will demonstrate selective attention to functional tasks in a mildly distracting enviornment for ~30 minutes with Min A verbal cues for redirection.  SLP Short Term Goal 4 (Week 1): Patient will self-monitor and correct errors during functional tasks with Min A verbal cues.  SLP Short Term Goal 5 (Week 1): Patient will utilize a slow rate and over-articulation to maximize intelligibility to 90% at the conversation level with Min A verbal cues.  SLP Short Term Goal 6 (Week 1): Patient will utilize word-fniding strategies at the conversation level with Min A verbal cues.   Skilled Therapeutic Interventions:Skilled ST services focused on cognitive skills. SLP facilitated medication management with recall of medication name and function utilizing visual aid,pt required extra time and mod-min A verbal cues. Pt required extensive continued education about the importance of taking medication consistently to assist in stroke prevention. Pt stated understanding, however SLP believes pt continues to lack comprehension of education. Pt was left in room with call bell within reach. Reccomend to continue skilled ST services.     Function:  Eating Eating                 Cognition Comprehension Comprehension assist level: Understands basic 90% of the time/cues < 10% of the time  Expression   Expression assist level: Expresses basic 50 - 74%  of the time/requires cueing 25 - 49% of the time. Needs to repeat parts of sentences.  Social Interaction Social Interaction assist level: Interacts appropriately 75 - 89% of the time - Needs redirection for appropriate language or to initiate interaction.  Problem Solving Problem solving assist level: Solves basic 25 - 49% of the time - needs direction more than half the time to initiate, plan or complete simple activities;Solves basic 50 - 74% of the time/requires cueing 25 - 49% of the time  Memory Memory assist level: Recognizes or recalls 50 - 74% of the time/requires cueing 25 - 49% of the time;Recognizes or recalls 75 - 89% of the time/requires cueing 10 - 24% of the time    Pain Pain Assessment Pain Score: 0-No pain  Therapy/Group: Individual Therapy  Cory Harvey  [facility] 04/03/2018, 5:04 PM

## 2018-04-03 NOTE — Progress Notes (Signed)
Subjective/Complaints: Up with staff at bedside. No new issues overnight. Sleep still an issue  ROS: Patient denies fever, rash, sore throat, blurred vision, nausea, vomiting, diarrhea, cough, shortness of breath or chest pain, joint or back pain, headache, or mood change.   Objective: Vital Signs: Blood pressure (!) 117/104, pulse 77, temperature 98 F (36.7 C), temperature source Oral, resp. rate 16, height _0  (1.727 m), weight 80 kg (176 lb 5.9 oz), SpO2 100 %. No results found. Results for orders placed or performed during the hospital encounter of 03/29/18 (from the past 72 hour(s))  CBC WITH DIFFERENTIAL     Status: Abnormal   Collection Time: 04/01/18  6:17 AM  Result Value Ref Range   WBC 6.6 4.0 - 10.5 K/uL   RBC 5.47 4.22 - 5.81 MIL/uL   Hemoglobin 15.7 13.0 - 17.0 g/dL   HCT 46.7 39.0 - 52.0 %   MCV 85.4 78.0 - 100.0 fL   MCH 28.7 26.0 - 34.0 pg   MCHC 33.6 30.0 - 36.0 g/dL   RDW 14.8 11.5 - 15.5 %   Platelets 427 (H) 150 - 400 K/uL   Neutrophils Relative % 69 %   Neutro Abs 4.5 1.7 - 7.7 K/uL   Lymphocytes Relative 21 %   Lymphs Abs 1.4 0.7 - 4.0 K/uL   Monocytes Relative 6 %   Monocytes Absolute 0.4 0.1 - 1.0 K/uL   Eosinophils Relative 3 %   Eosinophils Absolute 0.2 0.0 - 0.7 K/uL   Basophils Relative 1 %   Basophils Absolute 0.0 0.0 - 0.1 K/uL    Comment: Performed at [facility], [address], [place], [place] [address]  Comprehensive metabolic panel     Status: Abnormal   Collection Time: 04/01/18  6:17 AM  Result Value Ref Range   Sodium 137 135 - 145 mmol/L   Potassium 4.0 3.5 - 5.1 mmol/L   Chloride 106 101 - 111 mmol/L   CO2 25 22 - 32 mmol/L   Glucose, Bld 95 65 - 99 mg/dL   BUN 10 6 - 20 mg/dL   Creatinine, Ser 1.04 0.61 - 1.24 mg/dL   Calcium 9.2 8.9 - 10.3 mg/dL   Total Protein 6.1 (L) 6.5 - 8.1 g/dL   Albumin 3.5 3.5 - 5.0 g/dL   AST 23 15 - 41 U/L   ALT 30 17 - 63 U/L   Alkaline Phosphatase 49 38 - 126 U/L   Total Bilirubin  0.6 0.3 - 1.2 mg/dL   GFR calc non Af Amer >60 >60 mL/min   GFR calc Af Amer >60 >60 mL/min    Comment: (NOTE) The eGFR has been calculated using the CKD EPI equation. This calculation has not been validated in all clinical situations. eGFR's persistently <60 mL/min signify possible Chronic Kidney Disease.    Anion gap 6 5 - 15    Comment: Performed at [facility] [address] [place], [place] [address]     Constitutional: No distress . Vital signs reviewed. HEENT: EOMI, oral membranes moist Neck: supple Cardiovascular: RRR without murmur. No JVD    Respiratory: CTA Bilaterally without wheezes or rales. Normal effort    GI: BS +, non-tender, non-distended   Skin:   Intact Neuro: remains restless, distracted  Musc/Skel:  Other No pain with UE or LE ROM Gen NAD 4/5 RUE and RLE 5/5 left side--stable Psych: impulsive  Assessment/Plan: 1. Functional deficits secondary to Right-sided weakness withdysarthriasecondary to multiple small acute infarcts  right cerebral hemisphere, posterior watershed distribution which require 3+ hours per day of interdisciplinary therapy in a comprehensive inpatient rehab setting. Physiatrist is providing close team supervision and 24 hour management of active medical problems listed below. Physiatrist and rehab team continue to assess barriers to discharge/monitor patient progress toward functional and medical goals. FIM: Function - Bathing Position: Standing at sink Body parts bathed by patient: Right arm, Right lower leg, Left arm, Left lower leg, Chest, Abdomen, Front perineal area, Buttocks, Right upper leg, Left upper leg Body parts bathed by helper: Back Assist Level: Supervision or verbal cues  Function- Upper Body Dressing/Undressing What is the patient wearing?: Pull over shirt/dress Pull over shirt/dress - Perfomed by patient: Thread/unthread right sleeve, Thread/unthread left sleeve, Put head through opening, Pull shirt over  trunk Assist Level: Supervision or verbal cues Function - Lower Body Dressing/Undressing What is the patient wearing?: Pants, Non-skid slipper socks Position: Sitting EOB Underwear - Performed by patient: Thread/unthread left underwear leg, Pull underwear up/down Underwear - Performed by helper: Thread/unthread right underwear leg Pants- Performed by patient: Thread/unthread left pants leg, Pull pants up/down, Fasten/unfasten pants Pants- Performed by helper: Thread/unthread right pants leg, Thread/unthread left pants leg, Pull pants up/down Non-skid slipper socks- Performed by patient: Don/doff right sock, Don/doff left sock Assist for footwear: Supervision/touching assist Assist for lower body dressing: Supervision or verbal cues  Function - Toileting Toileting activity did not occur: No continent bowel/bladder event Toileting steps completed by patient: Adjust clothing prior to toileting, Performs perineal hygiene, Adjust clothing after toileting Toileting Assistive Devices: Grab bar or rail Assist level: Supervision or verbal cues  Function Midwife transfer assistive device: Grab bar Assist level to toilet: Supervision or verbal cues Assist level from toilet: Supervision or verbal cues  Function - Chair/bed transfer Chair/bed transfer method: Ambulatory Chair/bed transfer assist level: Supervision or verbal cues Chair/bed transfer assistive device: Armrests Chair/bed transfer details: Verbal cues for technique, Verbal cues for precautions/safety  Function - Locomotion: Wheelchair Will patient use wheelchair at discharge?: No Max wheelchair distance: 250' Assist Level: Supervision or verbal cues Assist Level: Moderate assistance (Pt 50 - 74%) Assist Level: Supervision or verbal cues Turns around,maneuvers to table,bed, and toilet,negotiates 3% grade,maneuvers on rugs and over doorsills: No Function - Locomotion: Ambulation Assistive device: No device Max  distance: 150 ft  Assist level: Supervision or verbal cues Assist level: Supervision or verbal cues Assist level: Supervision or verbal cues Assist level: Supervision or verbal cues Assist level: Supervision or verbal cues  Function - Comprehension Comprehension: Auditory Comprehension assist level: Understands complex 90% of the time/cues 10% of the time  Function - Expression Expression: Verbal Expression assist level: Expresses basic 75 - 89% of the time/requires cueing 10 - 24% of the time. Needs helper to occlude trach/needs to repeat words.  Function - Social Interaction Social Interaction assist level: Interacts appropriately 90% of the time - Needs monitoring or encouragement for participation or interaction.  Function - Problem Solving Problem solving assist level: Solves basic 75 - 89% of the time/requires cueing 10 - 24% of the time  Function - Memory Memory assist level: Recognizes or recalls 75 - 89% of the time/requires cueing 10 - 24% of the time, Recognizes or recalls 50 - 74% of the time/requires cueing 25 - 49% of the time Patient normally able to recall (first 3 days only): Current season, Location of own room, Staff names and faces, That he or she is in a hospital  Medical Problem List  and Plan: 1.Right-sided weakness withdysarthriasecondary to multiple small acute infarcts right cerebral hemisphere, posterior watershed distribution  -continue SLP, PT, OT, team conf today 2. DVT Prophylaxis/Anticoagulation: CBC  Normal    3. Pain Management:Tylenol as needed, add gabapentin for dysesthesia 4. Mood:Provide emotional support 5. Neuropsych: This patientiscapable of making decisions on hisown behalf. 6. Skin/Wound Care:Routine skin checks 7. Fluids/Electrolytes/Nutrition:continue Routine I's and O's    -po intake improving 8.Hypertension. Cozaar 29m daily. Monitor with increased mobility Blood pressure increasing.  Resume  Cozaar 9.Hyperlipidemia. Lipitor 10.Tobacco abuse.Counseling      LOS (Days) 5 A FACE TO FACE EVALUATION WAS PERFORMED  ZMeredith Staggers4/24/2019, 10:39 AM

## 2018-04-03 NOTE — Progress Notes (Signed)
Occupational Therapy Session Note  Patient Details  Name: Cory Harvey MRN: [id number] Date of Birth: January 29, 1961  Today's Date: 04/03/2018 OT Individual Time: 1105-1200 OT Individual Time Calculation (min): 55 min    Short Term Goals: Week 1:  OT Short Term Goal 1 (Week 1): Pt will correctly manage LRAD during functional ambulation with min cuing OT Short Term Goal 2 (Week 1): Pt will complete LB dressing with CGA OT Short Term Goal 3 (Week 1): Pt will complete 2 grooming tasks standing at sink with supervision in order to increase functional activity tolerance  Skilled Therapeutic Interventions/Progress Updates:    Pt seen for ADL bathing/dressing session. Pt in supine upon arrival, with encouragement willing to participate in OT session and bathing at shower level. Discussed d/c planning with Pt, plan for pt to d/c home tomorrow with supervision assist from step-father. Pt plants to return to his home and have step father stay with him there. Pt reports having tub/shower combination. Feel pt is capable to stepping over tub wall and therefore will not need tub transfer bench. Today's shower completed at standing level in order to assess need for shower chair or not at d/c. Pt completed bathing task standing with supervision for cuing/sequencing of bathing task. Pt able to maintain dynamic standing balance independently, occasional use of grab bar which pt reports he has in home bathroom. Pt in agreement of no need for shower chair at d/c. During shower task, pt required frequent cuing for proper sequencing and basic problem solving for set-up of shower items. Pt returned to EOB to dress with supervision. Pt internally/externally distracted, consistently wanting to turn TV back on in order to watch Price is Right requiring freqent redirection to therapy tasks.  He ambulated to ADL apartment, difficulty following R/L directions. In apartment, completed simulated tub/shower transfer, stepping  over tub wall with supervision using grab bar as he has at home. Pt cont to decline need for shower chair at d/c.  In therapy gym, pt sat EOM to complete fine motor activity. Initially pt with difficulty following directions for task, however, was eventually able to complete in-hand manipulation activity moving beads from palm to finger tips to place.  Pt returned to room at end of session, left seated in recliner with QRB and chair alarm on, and all needs in reach.    Therapy Documentation Precautions:  Precautions Precautions: Fall Restrictions Weight Bearing Restrictions: No Pain:   No/denies pain  See Function Navigator for Current Functional Status.   Therapy/Group: Individual Therapy  Maryagnes Carrasco L 04/03/2018, 7:15 AM

## 2018-04-03 NOTE — Consult Note (Signed)
Neuropsychological Consultation   Patient:   Cory Harvey   DOB:   [birth year]  MR Number:  [id number]  Location:  [facility] [id number] [place] [address] Dept: [phone] Loc: [phone]           Date of Service:   04/03/2018  Start Time:   2 PM End Time:   3 PM  Provider/Observer:  Arley Phenix, Psy.D.       Clinical Neuropsychologist       Billing Code/Service: [phone] 4 Units  Chief Complaint:    Cory Harvey is a 57 year old right-handed male who has a history of 4 prior cerebrovascular accidents.  The patient is been maintained on 81 mg of aspirin daily and Plavix.  He also has a history of tobacco abuse and hypertension.  The patient presented on 03/26/2018 with right-sided weakness and slurred speech.   There were also note of left side weakness as well.   CT scan was negative.  MRI showed multiple small acute infarcts in right cerebral hemisphere and posterior watershed distribution.  Reason for Service:  [name] was referred for neuropsychological consultation due to coping and adjustment issues.  Below is the HPI for the current admission.   HPI:Mr. Cory Harvey is a 57 year old right-handed male with history of CVA x4 maintained on aspirin 81 mg daily and Plavix, tobacco abuse, hypertension. Presented to [facility] 03/26/2018 with right-sided weakness and slurred speech. Per chart review patient lives alone. Reported to be independent prior to admission and driving. One level home. He has a brother as well as a father in the area that check on him regularly. Cranial CT scan negative. Patient did not receive TPA. MRI showed multiple small acute infarcts in the right cerebral hemisphere, posterior watershed distribution. Advanced chronic small vessel disease with multiple remote lacunar infarctions. Carotid Dopplers with no ICA stenosis. Echocardiogram with ejection  fraction of 65%. Systolic function was normal. TEE 03/27/2018 showing no vegetation without thrombus. No defect or PFO. Presently on aspirin 325 mg daily as well as Plavix for CVA prophylaxis. Subcu Lovenox for DVT prophylaxis. Tolerating a regular diet. Physical and occupational therapy evaluations completed. Patient was admitted for a comprehensive rehab program  Current Status:  Patient has ongoing issues with medication compliance.  He reports that he knows he has to do better.   Patient has had multiple strokes (now 5).  Right side weakness may be from old stroke.   Behavioral Observation: Cory Harvey  presents as a 57 y.o.-year-old Right African American Male who appeared his stated age. his dress was Appropriate and he was Well Groomed and his manners were Appropriate to the situation.  his participation was indicative of Appropriate behaviors.  There were any physical disabilities noted.  he displayed an appropriate level of cooperation and motivation.     Interactions:    Active Appropriate  Attention:   abnormal and attention span appeared shorter than expected for age  Memory:   abnormal; remote memory intact, recent memory impaired  Visuo-spatial:  not examined  Speech (Volume):  low  Speech:   slurred; garbled  Thought Process:  Coherent and Relevant  Though Content:  WNL; not suicidal and not homicidal  Orientation:   person, place, time/date and situation  Judgment:   Fair  Planning:   Poor  Affect:    Blunted  Mood:    NA  Insight:   Lacking  Intelligence:  low  Medical History:   Past Medical History:  Diagnosis Date  . Depression   . History of CVA (cerebrovascular accident)   . Hypertension   . Stroke (HCC)    x 4  . Tobacco abuse         Psychiatric History:  Patient does have past history of Depression.  Denies current depressive symptoms.  Family Med/Psych History:  Family History  Problem Relation Age of Onset  . Diabetes  Mother   . Heart disease Mother   . Hyperlipidemia Mother   . Hypertension Mother   . Stroke Mother   . Diabetes Father   . Hypertension Father   . Cancer Neg Hx     Impression/DX:  Cory ShropshireWillie Harvey is a 57 year old right-handed male who has a history of 4 prior cerebrovascular accidents.  The patient is been maintained on 81 mg of aspirin daily and Plavix.  He also has a history of tobacco abuse and hypertension.  The patient presented on 03/26/2018 with right-sided weakness and slurred speech.   There were also note of left side weakness as well.   CT scan was negative.  MRI showed multiple small acute infarcts in right cerebral hemisphere and posterior watershed distribution.  Patient has ongoing issues with medication compliance.  He reports that he knows he has to do better.   Patient has had multiple strokes (now 5).  Right side weakness may be from old stroke.   Diagnosis:    Cerebrovascular accident (CVA) involving right cerebral hemisphere [facility](HCC) - Plan: Ambulatory referral to Physical Medicine Rehab, Ambulatory referral to Neurology         Electronically Signed   _______________________ Arley PhenixJohn Ronnette Rump, Psy.D.

## 2018-04-03 NOTE — Progress Notes (Signed)
Occupational Therapy Session Note  Patient Details  Name: Cory Harvey MRN: [id number] Date of Birth: 09/29/1961  Today's Date: 04/03/2018 OT Individual Time: 1500-1600 OT Individual Time Calculation (min): 60 min    Short Term Goals: Week 1:  OT Short Term Goal 1 (Week 1): Pt will correctly manage LRAD during functional ambulation with min cuing OT Short Term Goal 2 (Week 1): Pt will complete LB dressing with CGA OT Short Term Goal 3 (Week 1): Pt will complete 2 grooming tasks standing at sink with supervision in order to increase functional activity tolerance  Skilled Therapeutic Interventions/Progress Updates:    Treatment session focused on ADLs/self care training, transfer training, safety awareness, cognitive training, and pt education. Upon entering pt resting in bed and agreeable to OT. Pt called parents for patient education but family unable to attend during this time. Pt agreed to completing meal prep activity in kitchen to utilize appliances to make toasted cheese. Pt required consistent verbal instruction on sequencing of task . Able to utilize the toaster and wash dishes with S. Difficulty with problem solving and execution of instructions. Pt completed tub transfer for reinforced education and demo'ed safe transfer with verbal cues. Pt returned to room via wheelchair for toileting task. Pt completed toileting task and tx with S for BM. Pt instructed on safe functional mobility in room and returned to bed to rest. All needs met.   Therapy Documentation Precautions:  Precautions Precautions: Fall Restrictions Weight Bearing Restrictions: No    Vital Signs: Therapy Vitals Temp: 98.4 F (36.9 C) Temp Source: Oral Pulse Rate: 73 Resp: 18 BP: 118/73 Patient Position (if appropriate): Lying Oxygen Therapy SpO2: 100 % O2 Device: Room Air Pain: Pain Assessment Pain Score: 0-No pain  See Function Navigator for Current Functional Status.   Therapy/Group:  Individual Therapy     04/03/2018, 4:34 PM  

## 2018-04-03 NOTE — Progress Notes (Signed)
Physical Therapy Discharge Summary  Patient Details  Name: Cory Harvey MRN: [id number] Date of Birth: [birth year]  Today's Date: 04/05/2018    Patient has met 7 of 7 long term goals due to improved activity tolerance, improved balance, improved postural control, increased strength and ability to compensate for deficits.  Patient to discharge at an ambulatory level Supervision without AD. Patient was scheduled for caregiver training on day of discharge, however pt discharged prior to sessions. This therapist was unable to meet or educate caregiver during pt's stay.   Reasons goals not met: n/a  Recommendation:  Patient will benefit from ongoing skilled PT services in home health setting to continue to advance safe functional mobility, address ongoing impairments in decreased dynamic balance, endurance, strength, impaired cognition, and minimize fall risk.  Equipment: No equipment provided  Reasons for discharge: treatment goals met  Patient/family agrees with progress made and goals achieved: Yes  PT Discharge Precautions/Restrictions Precautions Precautions: Fall Restrictions Weight Bearing Restrictions: No   Vision/Perception  Wears glasses at baseline.  Cognition Overall Cognitive Status: Impaired/Different from baseline Arousal/Alertness: Awake/alert Memory: Impaired Memory Impairment: Decreased short term memory;Decreased recall of new information Awareness: Impaired Awareness Impairment: Emergent impairment;Anticipatory impairment Problem Solving Impairment: Functional basic;Verbal basic Behaviors: Impulsive Safety/Judgment: Impaired  Motor  Motor Motor: Hemiplegia Motor - Skilled Clinical Observations: generalized weakness, mild R hemi   Mobility Bed Mobility Bed Mobility: Rolling Right;Rolling Left;Supine to Sit;Sit to Supine Rolling Right: 6: Modified independent (Device/Increase time) Rolling Left: 6: Modified independent (Device/Increase  time) Supine to Sit: 6: Modified independent (Device/Increase time) Sit to Supine: 6: Modified independent (Device/Increase time) Transfers Transfers: Yes Sit to Stand: 5: Supervision Stand to Sit: 5: Supervision  Locomotion  Ambulation Ambulation: Yes Ambulation/Gait Assistance: 5: Supervision Ambulation Distance (Feet): 150 Feet Assistive device: None Gait Gait: Yes Gait Pattern: (shuffled gait, decreased heel strike BLE, wide BOS, decreased step lenth) Stairs / Additional Locomotion Stairs: Yes Stairs Assistance: 5: Supervision Stair Management Technique: Two rails Number of Stairs: 12 Height of Stairs: 6(inches) Ramp: 5: Supervision(ambulatory without AD) Curb: 4: Min assist(with 1 UE support on rail in ortho gym) Wheelchair Mobility Wheelchair Mobility: No   Trunk/Postural Assessment  WFL  Balance Balance Balance Assessed: Yes Dynamic Standing Balance Dynamic Standing - Balance Support: No upper extremity supported;During functional activity Dynamic Standing - Level of Assistance: 5: Stand by assistance Dynamic Standing - Comments: during gait  41/56 Berg Balance Test on 03/30/18  Extremity Assessment  RUE Assessment RUE Assessment: Within Functional Limits LUE Assessment LUE Assessment: Within Functional Limits RLE Assessment RLE Assessment: Within Functional Limits LLE Assessment LLE Assessment: Within Functional Limits   See Function Navigator for Current Functional Status.  Waunita Schooner, PT, DPT 04/03/2018, 11:18 AM

## 2018-04-03 NOTE — Discharge Summary (Signed)
Discharge summary job # [id number]

## 2018-04-03 NOTE — Progress Notes (Signed)
Physical Therapy Session Note  Patient Details  Name: Cory Harvey MRN: [id number] Date of Birth: [birth year]  Today's Date: 04/03/2018 PT Individual Time: 9528-41320946-1030 PT Individual Time Calculation (min): 44 min   Short Term Goals: Week 1:  PT Short Term Goal 1 (Week 1): Pt will demonstrate appropriate safety awareness during functional mobility 50% of the time PT Short Term Goal 2 (Week 1): Pt will participate in 30 min of upright activity w/o increase in fatigue PT Short Term Goal 3 (Week 1): Pt will maintain dynamic standing balance w/ supervision  Skilled Therapeutic Interventions/Progress Updates:  Pt received in bed & agreeable to tx, no c/o pain but more c/o fatigue on this date & rest breaks taken PRN. Pt ambulated throughout unit without AD & completed grad day activities in preparation for d/c tomorrow. Pt is at a supervision level overall, ambulating without AD, using B rails for stair negotiation, but does require min assist and UE support to negotiate a curb. Pt completes a floor transfer with close supervision and cuing for hand placement to push up on stable surface to sitting. Pt stood on wedge for heel cord stretch while matching cards on velcro board. During session pt donned new scrub pants and required cuing to sit to thread/unthread pants on BLE and cuing for sequencing for safety. At end of session pt left in bed with alarm set, needs within reach & telesitter in room.   Pt continues to demonstrate wide BOS, impaired heel strike, and shuffled gait with decreased gait speed despite cuing.   Therapy Documentation Precautions:  Precautions Precautions: Fall Restrictions Weight Bearing Restrictions: No   See Function Navigator for Current Functional Status.   Therapy/Group: Individual Therapy  Cory Harvey 04/03/2018, 11:31 AM

## 2018-04-03 NOTE — Patient Care Conference (Signed)
Inpatient RehabilitationTeam Conference and Plan of Care Update Date: 04/03/2018   Time: 10:45 AM    Patient Name: Cory Harvey      Medical Record Number: [id number]  Date of Birth: 04-29-1961 Sex: Male         Room/Bed: 4W18C/4W18C-01 Payor Info: Payor: Advertising copywriter MEDICARE / Plan: [employer] MEDICARE / Product Type: *No Product type* /    Admitting Diagnosis: L  CVA  Admit Date/Time:  03/29/2018  1:49 PM Admission Comments: No comment available   Primary Diagnosis:  <principal problem not specified> Principal Problem: <principal problem not specified>  Patient Active Problem List   Diagnosis Date Noted  . Cerebrovascular accident (CVA) involving right cerebral hemisphere (HCC) 03/29/2018  . CVA (cerebral vascular accident) (HCC) 03/26/2018  . Acute CVA (cerebrovascular accident) (HCC) 09/02/2017  . Gait disturbance 09/02/2017  . History of CVA (cerebrovascular accident) 04/18/2017  . Tobacco use 04/18/2017  . Depression   . Hypertension   . Stroke [facility])     Expected Discharge Date: Expected Discharge Date: 04/04/18  Team Members Present: Physician leading conference: Dr. Maryla Morrow Social Worker Present: Dossie Der, LCSW Nurse Present: Willey Blade, RN PT Present: Aleda Grana, PT OT Present: Johnsie Cancel, OT SLP Present: Reuel Derby, SLP PPS Coordinator present : Tora Duck, RN, CRRN     Current Status/Progress Goal Weekly Team Focus  Medical   Patient with multiple right sided brain infarcts.  Patient with poor insight and awareness.  He is a fall risk.  Lead pressure has been labile.  P.o. intake is improving  Maximize participation with therapy  Blood pressure and nutritional management, following electrolytes   Bowel/Bladder   cont b/b; lbm 4/21  maintain continence  assess q shift and prn; admin prn laxative   Swallow/Nutrition/ Hydration             ADL's   Supervision- occasional min A overall; min cuing for safety awareness  Supervision overall   ADL/IADL re-training; safety awareness; functional activity tolerance; d/c planning; family ed   Mobility   supervision ambulation without AD  supervision overall  NMR, balance, endurance, cognitive remediation, safety awareness, pt/family education, gait   Communication   Mod-Min A  Supervision A  speech intelligibility strategies    Safety/Cognition/ Behavioral Observations  Mod-Min A  Supervision A  basic-midlly complex problem solving, error awareness, recall, selective attention and education   Pain   denies  <2  assess q shift and prn   Skin   CDI  maintain  assess q shift and prn      *See Care Plan and progress notes for long and short-term goals.     Barriers to Discharge  Current Status/Progress Possible Resolutions Date Resolved   Physician    Medical stability        Continue medical management as outlined in chart      Nursing                  PT  Decreased caregiver support  impaired cognition, lives alone              OT                  SLP                SW Decreased caregiver support Does not have 24 hr care            Discharge Planning/Teaching Needs:  Going home with father who can  provide supervision level. His brother is also around works during the day. Pt concerned aobut paying his bills-father aware of this      Team Discussion:  Goals supervision level for safety issues and for cueing. MD re-started BP meds. Impulsive and moves quickly. Activity tolerance is better. Plans to go to Dad's home for the supervision level.   Revisions to Treatment Plan:  DC 4/25    Continued Need for Acute Rehabilitation Level of Care: The patient requires daily medical management by a physician with specialized training in physical medicine and rehabilitation for the following conditions: Daily direction of a multidisciplinary physical rehabilitation program to ensure safe treatment while eliciting the highest outcome that is of practical value to the patient.:  Yes Daily medical management of patient stability for increased activity during participation in an intensive rehabilitation regime.: Yes Daily analysis of laboratory values and/or radiology reports with any subsequent need for medication adjustment of medical intervention for : Neurological problems;Nutritional problems;Blood pressure problems  Doreena Maulden, Lemar LivingsRebecca G 04/03/2018, 2:11 PM

## 2018-04-04 ENCOUNTER — Inpatient Hospital Stay (HOSPITAL_COMMUNITY): Payer: Medicare Other | Admitting: Occupational Therapy

## 2018-04-04 ENCOUNTER — Inpatient Hospital Stay (HOSPITAL_COMMUNITY): Payer: Medicare Other | Admitting: Speech Pathology

## 2018-04-04 MED ORDER — ATORVASTATIN CALCIUM 10 MG PO TABS: 20.0000 mg | ORAL_TABLET | Freq: Every day | ORAL | 0 refills | Status: DC

## 2018-04-04 MED ORDER — GABAPENTIN 100 MG PO CAPS: 100.0000 mg | ORAL_CAPSULE | Freq: Every day | ORAL | 0 refills | Status: DC

## 2018-04-04 MED ORDER — LOSARTAN POTASSIUM 25 MG PO TABS: 25.0000 mg | ORAL_TABLET | Freq: Every day | ORAL | 0 refills | Status: AC

## 2018-04-04 MED ORDER — CLOPIDOGREL BISULFATE 75 MG PO TABS: 75.0000 mg | ORAL_TABLET | Freq: Every day | ORAL | 0 refills | Status: DC

## 2018-04-04 MED ORDER — ASPIRIN 325 MG PO TBEC: 325.0000 mg | DELAYED_RELEASE_TABLET | Freq: Every day | ORAL | 0 refills | Status: DC

## 2018-04-04 NOTE — Progress Notes (Signed)
Subjective/Complaints: Up in chair. Ready to go home. Father at bedside  ROS: Patient denies fever, rash, sore throat, blurred vision, nausea, vomiting, diarrhea, cough, shortness of breath or chest pain, joint or back pain, headache, or mood change.   Objective: Vital Signs: Blood pressure 114/72, pulse 65, temperature 97.8 F (36.6 C), temperature source Oral, resp. rate 18, height 5\' 8"  (1.727 m), weight 80 kg (176 lb 5.9 oz), SpO2 99 %. No results found. No results found for this or any previous visit (from the past 72 hour(s)).   Constitutional: No distress . Vital signs reviewed. HEENT: EOMI, oral membranes moist Neck: supple Cardiovascular: RRR without murmur. No JVD    Respiratory: CTA Bilaterally without wheezes or rales. Normal effort    GI: BS +, non-tender, non-distended  Skin:   Intact Neuro: remains restless, distracted  Musc/Skel:  Other No pain with UE or LE ROM Gen NAD 4/5 RUE and RLE 5/5 left side--stable Psych: impulsive  Assessment/Plan: 1. 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 Physiatrist is providing close team supervision and 24 hour management of active medical problems listed below. Physiatrist and rehab team continue to assess barriers to discharge/monitor patient progress toward functional and medical goals. FIM: Function - Bathing Position: Shower Body parts bathed by patient: Right arm, Right lower leg, Left arm, Left lower leg, Chest, Abdomen, Front perineal area, Buttocks, Right upper leg, Left upper leg, Back Body parts bathed by helper: Back Assist Level: Supervision or verbal cues  Function- Upper Body Dressing/Undressing What is the patient wearing?: Pull over shirt/dress Pull over shirt/dress - Perfomed by  patient: Thread/unthread right sleeve, Thread/unthread left sleeve, Put head through opening, Pull shirt over trunk Assist Level: Supervision or verbal cues Function - Lower Body Dressing/Undressing What is the patient wearing?: Pants, Non-skid slipper socks Position: Sitting EOB Underwear - Performed by patient: Thread/unthread left underwear leg, Pull underwear up/down Underwear - Performed by helper: Thread/unthread right underwear leg Pants- Performed by patient: Thread/unthread left pants leg, Pull pants up/down, Thread/unthread right pants leg Pants- Performed by helper: Thread/unthread right pants leg, Thread/unthread left pants leg, Pull pants up/down Non-skid slipper socks- Performed by patient: Don/doff right sock, Don/doff left sock Assist for footwear: Supervision/touching assist Assist for lower body dressing: Supervision or verbal cues  Function - Toileting Toileting activity did not occur: No continent bowel/bladder event Toileting steps completed by patient: Adjust clothing prior to toileting, Performs perineal hygiene, Adjust clothing after toileting Toileting Assistive Devices: Grab bar or rail Assist level: Supervision or verbal cues  Function Programmer, multimedia- Toilet Transfers Toilet transfer assistive device: Grab bar Assist level to toilet: Supervision or verbal cues Assist level from toilet: Supervision or verbal cues  Function - Chair/bed transfer Chair/bed transfer method: Ambulatory Chair/bed transfer assist level: Supervision or verbal cues Chair/bed transfer assistive device: Armrests Chair/bed transfer details: Verbal cues for technique, Verbal cues for precautions/safety  Function - Locomotion: Wheelchair Will patient use wheelchair at discharge?: No Max wheelchair distance: 250' Assist Level: Supervision or verbal cues Assist Level: Moderate assistance (Pt 50 - 74%) Assist Level: Supervision or verbal cues Turns around,maneuvers to table,bed, and toilet,negotiates  3% grade,maneuvers on rugs and over doorsills: No Function - Locomotion: Ambulation Assistive device: No device Max distance: 150 ft  Assist level: Supervision or verbal cues Assist level: Supervision or verbal cues Assist level: Supervision or verbal cues  Assist level: Supervision or verbal cues Assist level: Supervision or verbal cues  Function - Comprehension Comprehension: Auditory Comprehension assist level: Understands basic 90% of the time/cues < 10% of the time  Function - Expression Expression: Verbal Expression assist level: Expresses basic 50 - 74% of the time/requires cueing 25 - 49% of the time. Needs to repeat parts of sentences.  Function - Social Interaction Social Interaction assist level: Interacts appropriately 75 - 89% of the time - Needs redirection for appropriate language or to initiate interaction.  Function - Problem Solving Problem solving assist level: Solves basic 25 - 49% of the time - needs direction more than half the time to initiate, plan or complete simple activities, Solves basic 50 - 74% of the time/requires cueing 25 - 49% of the time  Function - Memory Memory assist level: Recognizes or recalls 50 - 74% of the time/requires cueing 25 - 49% of the time, Recognizes or recalls 75 - 89% of the time/requires cueing 10 - 24% of the time Patient normally able to recall (first 3 days only): Current season, Location of own room, Staff names and faces, That he or she is in a hospital  Medical Problem List and Plan: 1.Right-sided weakness withdysarthriasecondary to multiple small acute infarcts right cerebral hemisphere, posterior watershed distribution  -continue SLP, PT, OT   -dc home today   -Patient to see Rehab MD/provider in the office for transitional care encounter in 1-2 weeks.  2. DVT Prophylaxis/Anticoagulation: CBC  Normal    3. Pain Management:Tylenol as needed, add gabapentin for dysesthesia 4. Mood:Provide emotional support 5.  Neuropsych: This patientiscapable of making decisions on hisown behalf. 6. Skin/Wound Care:Routine skin checks 7. Fluids/Electrolytes/Nutrition:continue Routine I's and O's    -po intake improving 8.Hypertension. Cozaar 25mg  daily. Monitor with increased mobility Blood pressure improvement after resumption of Cozaar 9.Hyperlipidemia. Lipitor 10.Tobacco abuse.Counseling      LOS (Days) 6 A FACE TO FACE EVALUATION WAS PERFORMED  Ranelle Oyster 04/04/2018, 9:37 AM

## 2018-04-04 NOTE — Discharge Instructions (Signed)
Inpatient Rehab Discharge Instructions  Erby PianWillie James Vandunk Discharge date and time: 04/04/18   Activities/Precautions/ Functional Status: Activity: activity as tolerated Diet: cardiac diet  Wound Care: None needed  Functional status:  ___ No restrictions     ___ Walk up steps independently _X__ 24/7 supervision/assistance   ___ Walk up steps with assistance ___ Intermittent supervision/assistance  ___ Bathe/dress independently ___ Walk with walker     _x__ Bathe/dress with assistance ___ Walk Independently    ___ Shower independently ___ Walk with supervision     ___ Shower with assistance _X__ No alcohol     ___ Return to work/school ________  Special Instructions: 1. Family needs to manage and administer medications as well as financial matters.  2. No driving or smoking    COMMUNITY REFERRALS UPON DISCHARGE:    Home Health:   PT, OT, SP, RN    Agency:[facility]   Phone:[phone]   Date of last service:04/04/2018  Medical Equipment/Items Ordered:  Agency/Supplier:   GENERAL COMMUNITY RESOURCES FOR PATIENT/FAMILY: Support Groups:[facility]A SUPPORT GROUP EVERY SECOND Thursday @ 3:00-4:00 PM ON THE REHAB UNIT (SEPT-MAY) QUESTIONS CONTACT CA[place] [phone]  STROKE/TIA DISCHARGE INSTRUCTIONS SMOKING Cigarette smoking nearly doubles your risk of having a stroke & is the single most alterable risk factor  If you smoke or have smoked in the last 12 months, you are advised to quit smoking for your health.  Most of the excess cardiovascular risk related to smoking disappears within a year of stopping.  Ask you doctor about anti-smoking medications  [place] Quit Line: 1-800-QUIT NOW  Free Smoking Cessation Classes [phone]  CHOLESTEROL Know your levels; limit fat & cholesterol in your diet  Lipid Panel     Component Value Date/Time   CHOL 132 03/27/2018 0647   CHOL 150 09/21/2014 0408   TRIG 49 03/27/2018 0647   TRIG 40 09/21/2014 0408   HDL 49 03/27/2018 0647   HDL 76 (H) 09/21/2014 0408   CHOLHDL 2.7 03/27/2018 0647   VLDL 10 03/27/2018 0647   VLDL 8 09/21/2014 0408   LDLCALC 73 03/27/2018 0647   LDLCALC 66 09/21/2014 0408      Many patients benefit from treatment even if their cholesterol is at goal.  Goal: Total Cholesterol (CHOL) less than 160  Goal:  Triglycerides (TRIG) less than 150  Goal:  HDL greater than 40  Goal:  LDL (LDLCALC) less than 100   BLOOD PRESSURE American Stroke Association blood pressure target is less that 120/80 mm/Hg  Your discharge blood pressure is:  BP: 114/72  Monitor your blood pressure  Limit your salt and alcohol intake  Many individuals will require more than one medication for high blood pressure  DIABETES (A1c is a blood sugar average for last 3 months) Goal HGBA1c is under 7% (HBGA1c is blood sugar average for last 3 months)  Diabetes: No known diagnosis of diabetes    Lab Results  Component Value Date   HGBA1C 5.5 03/27/2018     Your HGBA1c can be lowered with medications, healthy diet, and exercise.  Check your blood sugar as directed by your physician  Call your physician if you experience unexplained or low blood sugars.  PHYSICAL ACTIVITY/REHABILITATION Goal is 30 minutes at least 4 days per week  Activity: Increase activity slowly, Therapies: Physical Therapy: Home Health Return to work:   Activity decreases your risk of heart attack and stroke and makes your heart stronger.  It helps control your weight and blood pressure; helps you relax  and can improve your mood.  Participate in a regular exercise program.  Talk with your doctor about the best form of exercise for you (dancing, walking, swimming, cycling).  DIET/WEIGHT Goal is to maintain a healthy weight  Your discharge diet is: Fall precautions Diet Heart Room service appropriate? Yes; Fluid consistency: Thin  liquids Your height is:  Height: 5\' 8"  (172.7 cm) Your current weight is: Weight: 80 kg (176 lb 5.9 oz) Your Body  Mass Index (BMI) is:  BMI (Calculated): 26.82  Following the type of diet specifically designed for you will help prevent another stroke.  Your goal weight range is:    Your goal Body Mass Index (BMI) is 19-24.  Healthy food habits can help reduce 3 risk factors for stroke:  High cholesterol, hypertension, and excess weight.  RESOURCES Stroke/Support Group:  Call [phone]   STROKE EDUCATION PROVIDED/REVIEWED AND GIVEN TO PATIENT Stroke warning signs and symptoms How to activate emergency medical system (call 911). Medications prescribed at discharge. Need for follow-up after discharge. Personal risk factors for stroke. Pneumonia vaccine given:  Flu vaccine given:  My questions have been answered, the writing is legible, and I understand these instructions.  I will adhere to these goals & educational materials that have been provided to me after my discharge from the hospital.   STROKE/TIA DISCHARGE INSTRUCTIONS SMOKING Cigarette smoking nearly doubles your risk of having a stroke & is the single most alterable risk factor  If you smoke or have smoked in the last 12 months, you are advised to quit smoking for your health.  Most of the excess cardiovascular risk related to smoking disappears within a year of stopping.  Ask you doctor about anti-smoking medications  [place] Quit Line: 1-800-QUIT NOW  Free Smoking Cessation Classes [phone]  CHOLESTEROL Know your levels; limit fat & cholesterol in your diet  Lipid Panel     Component Value Date/Time   CHOL 132 03/27/2018 0647   CHOL 150 09/21/2014 0408   TRIG 49 03/27/2018 0647   TRIG 40 09/21/2014 0408   HDL 49 03/27/2018 0647   HDL 76 (H) 09/21/2014 0408   CHOLHDL 2.7 03/27/2018 0647   VLDL 10 03/27/2018 0647   VLDL 8 09/21/2014 0408   LDLCALC 73 03/27/2018 0647   LDLCALC 66 09/21/2014 0408      Many patients benefit from treatment even if their cholesterol is at goal.  Goal: Total Cholesterol (CHOL) less than  160  Goal:  Triglycerides (TRIG) less than 150  Goal:  HDL greater than 40  Goal:  LDL (LDLCALC) less than 100   BLOOD PRESSURE American Stroke Association blood pressure target is less that 120/80 mm/Hg  Your discharge blood pressure is:  BP: 114/72  Monitor your blood pressure  Limit your salt and alcohol intake  Many individuals will require more than one medication for high blood pressure  DIABETES (A1c is a blood sugar average for last 3 months) Goal HGBA1c is under 7% (HBGA1c is blood sugar average for last 3 months)  Diabetes: No known diagnosis of diabetes    Lab Results  Component Value Date   HGBA1C 5.5 03/27/2018     Your HGBA1c can be lowered with medications, healthy diet, and exercise.  Check your blood sugar as directed by your physician  Call your physician if you experience unexplained or low blood sugars.  PHYSICAL ACTIVITY/REHABILITATION Goal is 30 minutes at least 4 days per week  Activity: Increase activity slowly, and No driving, Therapies: see  above Return to work:   Activity decreases your risk of heart attack and stroke and makes your heart stronger.  It helps control your weight and blood pressure; helps you relax and can improve your mood.  Participate in a regular exercise program.  Talk with your doctor about the best form of exercise for you (dancing, walking, swimming, cycling).  DIET/WEIGHT Goal is to maintain a healthy weight  Your discharge diet is: Fall precautions Diet Heart Room service appropriate? Yes; Fluid consistency: Thin  liquids Your height is:  Height: 5\' 8"  (172.7 cm) Your current weight is: Weight: 80 kg (176 lb 5.9 oz) Your Body Mass Index (BMI) is:  BMI (Calculated): 26.82  Following the type of diet specifically designed for you will help prevent another stroke.  Your goal weight range is:    Your goal Body Mass Index (BMI) is 19-24.  Healthy food habits can help reduce 3 risk factors for stroke:  High cholesterol,  hypertension, and excess weight.  RESOURCES Stroke/Support Group:  Call [phone]   STROKE EDUCATION PROVIDED/REVIEWED AND GIVEN TO PATIENT Stroke warning signs and symptoms How to activate emergency medical system (call 911). Medications prescribed at discharge. Need for follow-up after discharge. Personal risk factors for stroke. Pneumonia vaccine given:  Flu vaccine given:  My questions have been answered, the writing is legible, and I understand these instructions.  I will adhere to these goals & educational materials that have been provided to me after my discharge from the hospital.     My questions have been answered and I understand these instructions. I will adhere to these goals and the provided educational materials after my discharge from the hospital.  Patient/Caregiver Signature _______________________________ Date __________  Clinician Signature _______________________________________ Date __________  Please bring this form and your medication list with you to all your follow-up doctor's appointments.

## 2018-04-04 NOTE — Progress Notes (Addendum)
Social Work  Discharge Note  The overall goal for the admission was met for:   Discharge location: Yes-HOME WITH DAD WHO CAN BE THERE WITH HIM  Length of Stay: Yes-7 DAYS  Discharge activity level: Yes-SUPERVISION LEVEL  Home/community participation: Yes  Services provided included: MD, RD, PT, OT, SLP, RN, CM, Pharmacy, Neuropsych and SW  Financial Services: Private Insurance: [facility]  Follow-up services arranged: Home Health: [facility]-PT,OT,SP,RN and Patient/Family has no preference for HH/DME agencies  Comments (or additional information):DAD IS 58 YO AND CAN NOT PROVIDE PHYSICAL ASSIST HE IS THERE AND CAN TRY TO MAKE SURE PT IS SAFE. PT IS AT HIGH RISK TO FALL DUE TO IMPULSIVITY. REFUSED TUB BENCH DUE TO COST. HOPEFULLY PT WILL BE COMPLIANT WITH HIS MEDICATIONS SINCE WAS NOT PRIOR TO ADMISSION. FAMILY REFUSED TO STAY FOR EDUCATION EVEN THOUGH SET UP TO ACCOMMODATE THEM AND THEIR SCHEDULES.  Patient/Family verbalized understanding of follow-up arrangements: Yes  Individual responsible for coordination of the follow-up plan: SELF & BOBBY-FATHER  Confirmed correct DME delivered: Elease Hashimoto 04/04/2018    Elease Hashimoto

## 2018-04-04 NOTE — Progress Notes (Signed)
Pt d/c home with belongings by  father. Discharge instructions provided to pt and father by P.Love. Pt provided handouts on medications and importance of taking medications.

## 2018-04-04 NOTE — Discharge Summary (Signed)
NAME:  SALLIE, MAKER                     ACCOUNT NO.:  MEDICAL RECORD NO.:  [id number]  LOCATION:                                 FACILITY:  PHYSICIAN:  Erick Colace, M.D.DATE OF BIRTH:  10/03/1961  DATE OF ADMISSION:  03/30/2018 DATE OF DISCHARGE:  04/04/2018                              DISCHARGE SUMMARY   DISCHARGE DIAGNOSES: 1. Acute infarction of right cerebral hemisphere, posterior watershed     distribution. 2. Lovenox for deep vein thrombosis prophylaxis. 3. Pain management. 4. Hypertension. 5. Hyperlipidemia. 6. Tobacco abuse.  HISTORY OF PRESENT ILLNESS:  This is a 57 year old right-handed male with history of CVA x4, maintained on aspirin as well as Plavix, tobacco abuse and hypertension.  Presented to an [facility], March 26, 2018, with right-sided weakness and slurred speech.  Per report, the patient independent prior to admission and driving.  One-level home.  He has a brother and a father in the area to check on him regularly.  Cranial CT scan negative.  The patient did not receive tPA.  MRI showed multiple small acute infarctions in the right cerebral hemisphere, posterior watershed distribution.  Advanced chronic small-vessel disease with multiple remote lacunar infarctions.  Carotid Dopplers, no ICA stenosis. Echocardiogram with ejection fraction of 65%.  TEE showed no vegetation without thrombus, no defect or PFO.  The patient remained on aspirin 325 mg daily as well as Plavix for CVA prophylaxis.  Subcutaneous Lovenox for DVT prophylaxis.  The patient was admitted for a comprehensive rehab program.  PAST MEDICAL HISTORY:  See discharge diagnoses.  SOCIAL HISTORY:  Lives alone, independent prior to admission.  FUNCTIONAL STATUS:  Upon admission to [facility], was minimal guard, 160 feet, rolling walker; minimal assist, sit to stand; min-to-mod assist, activities of daily living.  PHYSICAL EXAMINATION:  VITAL SIGNS:  Blood pressure 115/81, pulse  58, temperature 97, respirations 18. GENERAL:  Alert male, in no acute distress. HEENT:  EOMs intact. NECK:  Supple.  Nontender.  No JVD. CARDIAC:  Rate controlled. ABDOMEN:  Soft, nontender.  Good bowel sounds. LUNGS:  Clear to auscultation without wheeze. NEUROLOGIC:  He followed full commands.  Speech mildly dysarthric and he did stutter somewhat.  REHABILITATION HOSPITAL COURSE:  The patient was admitted to [facility] with therapies initiated on a 3-hour daily basis, consisting of physical therapy, occupational therapy, speech therapy and rehabilitation nursing.  The following issues were addressed during the patient's rehabilitation stay.  Pertaining to Mr. Bakken, right cerebral hemisphere infarction remained stable.  He continued on aspirin and Plavix therapy.  Subcutaneous Lovenox for DVT prophylaxis.  Ambulatory referral obtained to Neurology Services.  Pain management with use of Neurontin 100 mg at bedtime.  Blood pressure was controlled with Cozaar 25 mg daily.  He would follow up with his primary MD.  The patient remained on Lipitor for hyperlipidemia.  Noted history of tobacco abuse, receiving full counselling in regard to cessation of nicotine products. It was questionable if he would be compliant with these requests.  The patient received weekly collaborative interdisciplinary team conferences to discuss estimated length of stay, family teaching, any barriers to discharge.  He ambulates throughout  the unit without assistive device. He was at a supervision level overall, ambulating without device, bilateral hand rails for stair negotiation.  He could gather his belongings for activities of daily living and home making and was advised no driving.  He was discharged to home.  DISCHARGE MEDICATIONS:  Included: 1. Aspirin 325 mg p.o. daily. 2. Lipitor 20 mg p.o. daily. 3. Plavix 75 mg p.o. daily. 4. Neurontin 100 mg p.o. at bedtime. 5. Cozaar 25 mg p.o.  daily.  DIET:  His diet was regular.  FOLLOWUP:  He would follow up with Dr. Claudette LawsAndrew Kirsteins [facility] as directed; ambulatory referral obtained to Neurology Services.  Follow up primary care provider, Dr. Burnadette PopLinthavong, call for appointment.  SPECIAL INSTRUCTIONS:  No driving or smoking.     Mariam Dollaraniel Karianna Gusman, P.A.   ______________________________ Erick ColaceAndrew E. Kirsteins, M.D.    DA/MEDQ  D:  04/03/2018  T:  04/04/2018  Job:  [id number]  cc:   Erick ColaceAndrew E. Kirsteins, M.D. Marisue IvanKanhka Linthavong, MD

## 2018-04-04 NOTE — Plan of Care (Signed)
Pt continues to requires24 hours care for safety

## 2018-04-04 NOTE — Progress Notes (Signed)
Occupational Therapy Discharge Summary  Patient Details  Name: Cory Harvey MRN: [id number] Date of Birth: 1961-08-14   Patient has met 12 of 12 long term goals due to improved activity tolerance, improved balance and postural control.  Patient to discharge at overall Supervision level.  Plan is for pt's father to provide 24 hr assist at d/c. Pt and pt's father refused family education session on day of d/c. CSW and PA spoke with pt's father and he is aware of pt's need for supervision due to cognitive deficits.   Difficult to assess pt's PLOF and strength deficits which are from most recent CVA. Pt is able to use B UEs at functional level in order to complete basic ADLs.   Recommendation:  Patient will benefit from ongoing skilled OT services in home health setting to continue to advance functional skills in the area of BADL and Reduce care partner burden.  Equipment: No equipment provided  Reasons for discharge: treatment goals met and discharge from hospital  Patient/family agrees with progress made and goals achieved: Yes  OT Discharge Precautions/Restrictions  Precautions Precautions: Fall Restrictions Weight Bearing Restrictions: No Vision Baseline Vision/History: Wears glasses Wears Glasses: At all times Patient Visual Report: No change from baseline Vision Assessment?: Vision impaired- to be further tested in functional context Additional Comments: Pt unable to verbalize visual deficits, however, requires cuing throughout session due to perceptual deficits Perception  Perception: Impaired Praxis Praxis: Impaired Praxis Impairment Details: Perseveration;Motor planning Cognition Overall Cognitive Status: Impaired/Different from baseline Arousal/Alertness: Awake/alert Orientation Level: Oriented X4 Attention: Sustained Focused Attention: Appears intact Sustained Attention: Impaired Sustained Attention Impairment: Verbal basic;Functional basic Alternating  Attention: Impaired Memory: Impaired Memory Impairment: Decreased recall of new information;Storage deficit;Retrieval deficit Awareness: Impaired Awareness Impairment: Emergent impairment Problem Solving: Impaired Problem Solving Impairment: Verbal basic;Functional basic Executive Function: Sequencing;Organizing Reasoning: Impaired Reasoning Impairment: Verbal basic;Functional basic Sequencing: Impaired Sequencing Impairment: Verbal basic;Functional basic Organizing: Impaired Organizing Impairment: Verbal basic;Functional basic Behaviors: Impulsive Safety/Judgment: Impaired Comments: Decreased safety awareness; decreased awareness of deficits Sensation Sensation Light Touch: Appears Intact Proprioception: Impaired Detail Proprioception Impaired Details: Impaired RUE Coordination Gross Motor Movements are Fluid and Coordinated: No Fine Motor Movements are Fluid and Coordinated: No Motor  Motor Motor: Hemiplegia Motor - Discharge Observations: Generalized weakness; mild R hemi Trunk/Postural Assessment  Cervical Assessment Cervical Assessment: Within Functional Limits Thoracic Assessment Thoracic Assessment: Within Functional Limits Lumbar Assessment Lumbar Assessment: Within Functional Limits Postural Control Postural Control: Within Functional Limits  Balance Balance Balance Assessed: Yes Dynamic Sitting Balance Dynamic Sitting - Balance Support: During functional activity;Feet supported Dynamic Sitting - Level of Assistance: 5: Stand by assistance Static Standing Balance Static Standing - Balance Support: During functional activity;No upper extremity supported Static Standing - Level of Assistance: 5: Stand by assistance Dynamic Standing Balance Dynamic Standing - Balance Support: During functional activity;No upper extremity supported Dynamic Standing - Level of Assistance: 5: Stand by assistance Extremity/Trunk Assessment RUE Assessment RUE Assessment: Within  Functional Limits LUE Assessment LUE Assessment: Within Functional Limits   See Function Navigator for Current Functional Status.  Cory Harvey L 04/04/2018, 12:14 PM

## 2018-04-04 NOTE — Progress Notes (Signed)
Family education set with father regarding care needed. Father and patient refused to stay and complete education. Father has been educated on need for 24 hours supervision as well as assistance needed with medication/compliance.

## 2018-04-04 NOTE — Progress Notes (Signed)
Occupational Therapy Note  Patient Details  Name: Cory Harvey [name] MRN: [id number] Date of Birth: 01-25-1961   Pt discharged prior to scheduled family education session. Pt and father refusing family education. Pt's father made aware by CSW and PA of need for 24 hr supervision assist at d/c due to cognitive deficits. This therapist did not meet pt's father or discuss any d/c recommendations with him.   Obinna Ehresman L 04/04/2018, 11:27 AM

## 2018-04-04 NOTE — Progress Notes (Signed)
Speech Language Pathology Discharge Summary  Patient Details  Name: Cory Harvey MRN: [id number] Date of Birth: 1961/04/05  Today's Date: 04/04/2018   Patient has met 1 of 6 long term goals.  Patient to discharge at overall Mod level.  Reasons goals not met: Pt chose to left before recommended discharge date.    Clinical Impression/Discharge Summary:   Pt's family came in for education but chose to leave before scheduled session. No education provided.  Care Partner:  Caregiver Able to Provide Assistance: Yes  Type of Caregiver Assistance: Cognitive  Recommendation:  24 hour supervision/assistance;Home Health SLP      Equipment:     Reasons for discharge: Discharged from hospital       Function:  Eating Eating                 Cognition Comprehension Comprehension assist level: Understands basic 90% of the time/cues < 10% of the time  Expression   Expression assist level: Expresses basic 50 - 74% of the time/requires cueing 25 - 49% of the time. Needs to repeat parts of sentences.  Social Interaction Social Interaction assist level: Interacts appropriately 75 - 89% of the time - Needs redirection for appropriate language or to initiate interaction.  Problem Solving Problem solving assist level: Solves basic 25 - 49% of the time - needs direction more than half the time to initiate, plan or complete simple activities;Solves basic 50 - 74% of the time/requires cueing 25 - 49% of the time  Memory Memory assist level: Recognizes or recalls 50 - 74% of the time/requires cueing 25 - 49% of the time;Recognizes or recalls 75 - 89% of the time/requires cueing 10 - 24% of the time   Antuane Eastridge 04/04/2018, 10:37 AM

## 2018-04-05 ENCOUNTER — Inpatient Hospital Stay (HOSPITAL_COMMUNITY): Payer: Medicare Other | Admitting: Physical Therapy

## 2018-04-05 LAB — HIV ANTIBODY (ROUTINE TESTING W REFLEX): HIV Screen 4th Generation wRfx: NONREACTIVE

## 2018-04-08 ENCOUNTER — Telehealth: Payer: Self-pay | Admitting: Registered Nurse

## 2018-04-08 NOTE — Telephone Encounter (Signed)
Placed a transitional care call to Mr. Dorer, no answer. Left message to return the call. Placed a call to his father  Mr. Estella Husk, he states his son is at his home. Will await a return call.

## 2018-04-09 NOTE — Telephone Encounter (Signed)
Transitional Care call Transitional Care Call Completed, Appointment Confirmed, Addressed Confirmed. New Patient Packet Mailed  Patient name: Cory Harvey DOB: 10-10-1961 1. Are you/is patient experiencing any problems since coming home? No a. Are there any questions regarding any aspect of care? No 2. Are there any questions regarding medications administration/dosing? No a. Are meds being taken as prescribed? Yes b. "Patient should review meds with caller to confirm" Medication List Reviewed 3. Have there been any falls? No 4. Has Home Health been to the house and/or have they contacted you? Yes, [facility] a. If not, have you tried to contact them? NA b. Can we help you contact them? NA 5. Are bowels and bladder emptying properly? Yes a. Are there any unexpected incontinence issues? No b. If applicable, is patient following bowel/bladder programs? NA 6. Any fevers, problems with breathing, unexpected pain? No 7. Are there any skin problems or new areas of breakdown? No 8. Has the patient/family member arranged specialty MD follow up (ie cardiology/neurology/renal/surgical/etc.)?  Cory Harvey states appointments have been scheduled.  a. Can we help arrange? NA 9. Does the patient need any other services or support that we can help arrange? No 10. Are caregivers following through as expected in assisting the patient? Self-Care 11. Has the patient quit smoking, drinking alcohol, or using drugs as recommended? Cory Harvey states he smokes, denies alcohol or illicit drugs. Cory Harvey was encouraged  and educated on smoking cessation, he verbalizes understanding.   Appointment date/time Apr 15, 2018 arrival time at 1:00 for 1:20 appointment with Jacalyn Lefevre ANP-C. At [address] [facility] [address]

## 2018-04-15 ENCOUNTER — Encounter: Payer: Medicare Other | Attending: Registered Nurse | Admitting: Registered Nurse

## 2018-04-16 ENCOUNTER — Telehealth: Payer: Self-pay | Admitting: *Deleted

## 2018-04-16 NOTE — Telephone Encounter (Signed)
Cory Harvey, [facility] left a message stating that patient is going to be discharged from home health due to non-compliance.  Multiple attempts have been made.  Reinstatement will require a new referral....FYI

## 2018-04-16 NOTE — Telephone Encounter (Signed)
Has not followed up here either

## 2018-05-25 ENCOUNTER — Inpatient Hospital Stay (HOSPITAL_COMMUNITY)
Admission: EM | Admit: 2018-05-25 | Discharge: 2018-05-29 | DRG: 024 | Disposition: A | Discharge status: Skilled Nursing Facility | Payer: Medicare Other | Attending: Neurology | Admitting: Neurology

## 2018-05-25 ENCOUNTER — Inpatient Hospital Stay (HOSPITAL_COMMUNITY): Payer: Medicare Other

## 2018-05-25 ENCOUNTER — Encounter (HOSPITAL_COMMUNITY)
Admission: EM | Disposition: A | Discharge status: Skilled Nursing Facility | Payer: Self-pay | Source: Home / Self Care | Attending: Neurology

## 2018-05-25 ENCOUNTER — Emergency Department (HOSPITAL_COMMUNITY): Payer: Medicare Other | Admitting: Anesthesiology

## 2018-05-25 ENCOUNTER — Emergency Department (HOSPITAL_COMMUNITY): Payer: Medicare Other

## 2018-05-25 DIAGNOSIS — E785 Hyperlipidemia, unspecified: Secondary | ICD-10-CM | POA: Diagnosis present

## 2018-05-25 DIAGNOSIS — I63511 Cerebral infarction due to unspecified occlusion or stenosis of right middle cerebral artery: Secondary | ICD-10-CM | POA: Diagnosis present

## 2018-05-25 DIAGNOSIS — Y838 Other surgical procedures as the cause of abnormal reaction of the patient, or of later complication, without mention of misadventure at the time of the procedure: Secondary | ICD-10-CM | POA: Diagnosis not present

## 2018-05-25 DIAGNOSIS — Z7902 Long term (current) use of antithrombotics/antiplatelets: Secondary | ICD-10-CM | POA: Diagnosis not present

## 2018-05-25 DIAGNOSIS — F1410 Cocaine abuse, uncomplicated: Secondary | ICD-10-CM

## 2018-05-25 DIAGNOSIS — R29716 NIHSS score 16: Secondary | ICD-10-CM | POA: Diagnosis present

## 2018-05-25 DIAGNOSIS — G8194 Hemiplegia, unspecified affecting left nondominant side: Secondary | ICD-10-CM | POA: Diagnosis present

## 2018-05-25 DIAGNOSIS — I6330 Cerebral infarction due to thrombosis of unspecified cerebral artery: Secondary | ICD-10-CM | POA: Diagnosis not present

## 2018-05-25 DIAGNOSIS — Z823 Family history of stroke: Secondary | ICD-10-CM | POA: Diagnosis not present

## 2018-05-25 DIAGNOSIS — Z8249 Family history of ischemic heart disease and other diseases of the circulatory system: Secondary | ICD-10-CM | POA: Diagnosis not present

## 2018-05-25 DIAGNOSIS — I6601 Occlusion and stenosis of right middle cerebral artery: Secondary | ICD-10-CM | POA: Diagnosis present

## 2018-05-25 DIAGNOSIS — Z79899 Other long term (current) drug therapy: Secondary | ICD-10-CM | POA: Diagnosis not present

## 2018-05-25 DIAGNOSIS — S301XXA Contusion of abdominal wall, initial encounter: Secondary | ICD-10-CM | POA: Diagnosis not present

## 2018-05-25 DIAGNOSIS — R29708 NIHSS score 8: Secondary | ICD-10-CM | POA: Diagnosis not present

## 2018-05-25 DIAGNOSIS — Z7982 Long term (current) use of aspirin: Secondary | ICD-10-CM | POA: Diagnosis not present

## 2018-05-25 DIAGNOSIS — H5347 Heteronymous bilateral field defects: Secondary | ICD-10-CM | POA: Diagnosis present

## 2018-05-25 DIAGNOSIS — I10 Essential (primary) hypertension: Secondary | ICD-10-CM | POA: Diagnosis present

## 2018-05-25 DIAGNOSIS — I639 Cerebral infarction, unspecified: Secondary | ICD-10-CM

## 2018-05-25 DIAGNOSIS — R471 Dysarthria and anarthria: Secondary | ICD-10-CM | POA: Diagnosis present

## 2018-05-25 DIAGNOSIS — Z8673 Personal history of transient ischemic attack (TIA), and cerebral infarction without residual deficits: Secondary | ICD-10-CM | POA: Diagnosis not present

## 2018-05-25 DIAGNOSIS — R29810 Facial weakness: Secondary | ICD-10-CM | POA: Diagnosis present

## 2018-05-25 DIAGNOSIS — I63311 Cerebral infarction due to thrombosis of right middle cerebral artery: Secondary | ICD-10-CM | POA: Diagnosis present

## 2018-05-25 DIAGNOSIS — F17210 Nicotine dependence, cigarettes, uncomplicated: Secondary | ICD-10-CM | POA: Diagnosis present

## 2018-05-25 DIAGNOSIS — R29709 NIHSS score 9: Secondary | ICD-10-CM | POA: Diagnosis not present

## 2018-05-25 DIAGNOSIS — Z720 Tobacco use: Secondary | ICD-10-CM | POA: Diagnosis present

## 2018-05-25 DIAGNOSIS — S3012XA Contusion of groin, initial encounter: Secondary | ICD-10-CM

## 2018-05-25 HISTORY — PX: IR INTRA CRAN STENT: IMG2345

## 2018-05-25 HISTORY — PX: IR CT HEAD LTD: IMG2386

## 2018-05-25 HISTORY — PX: RADIOLOGY WITH ANESTHESIA: SHX6223

## 2018-05-25 LAB — COMPREHENSIVE METABOLIC PANEL
ALBUMIN: 4 g/dL (ref 3.5–5.0)
ALK PHOS: 55 U/L (ref 38–126)
ALT: 12 U/L — ABNORMAL LOW (ref 17–63)
AST: 13 U/L — AB (ref 15–41)
Anion gap: 10 (ref 5–15)
BILIRUBIN TOTAL: 0.6 mg/dL (ref 0.3–1.2)
BUN: 10 mg/dL (ref 6–20)
CALCIUM: 9.3 mg/dL (ref 8.9–10.3)
CO2: 22 mmol/L (ref 22–32)
Chloride: 108 mmol/L (ref 101–111)
Creatinine, Ser: 1.25 mg/dL — ABNORMAL HIGH (ref 0.61–1.24)
GFR calc Af Amer: >60 mL/min (ref >60)
GFR calc non Af Amer: >60 mL/min (ref >60)
GLUCOSE: 90 mg/dL (ref 65–99)
POTASSIUM: 3.8 mmol/L (ref 3.5–5.1)
Sodium: 140 mmol/L (ref 135–145)
TOTAL PROTEIN: 6.6 g/dL (ref 6.5–8.1)

## 2018-05-25 LAB — I-STAT CHEM 8, ED
BUN: 11 mg/dL (ref 6–20)
Calcium, Ion: 1.23 mmol/L (ref 1.15–1.40)
Chloride: 105 mmol/L (ref 101–111)
Creatinine, Ser: 1.2 mg/dL (ref 0.61–1.24)
Glucose, Bld: 87 mg/dL (ref 65–99)
HEMATOCRIT: 51 % (ref 39.0–52.0)
Hemoglobin: 17.3 g/dL — ABNORMAL HIGH (ref 13.0–17.0)
Potassium: 3.6 mmol/L (ref 3.5–5.1)
SODIUM: 141 mmol/L (ref 135–145)
TCO2: 21 mmol/L — AB (ref 22–32)

## 2018-05-25 LAB — DIFFERENTIAL
ABS IMMATURE GRANULOCYTES: 0 10*3/uL (ref 0.0–0.1)
Basophils Absolute: 0.1 10*3/uL (ref 0.0–0.1)
Basophils Relative: 1 %
Eosinophils Absolute: 0.2 10*3/uL (ref 0.0–0.7)
Eosinophils Relative: 2 %
IMMATURE GRANULOCYTES: 0 %
LYMPHS ABS: 1.3 10*3/uL (ref 0.7–4.0)
LYMPHS PCT: 15 %
Monocytes Absolute: 0.6 10*3/uL (ref 0.1–1.0)
Monocytes Relative: 7 %
NEUTROS ABS: 6.6 10*3/uL (ref 1.7–7.7)
Neutrophils Relative %: 75 %

## 2018-05-25 LAB — CBC
HEMATOCRIT: 49.9 % (ref 39.0–52.0)
Hemoglobin: 15.9 g/dL (ref 13.0–17.0)
MCH: 27.3 pg (ref 26.0–34.0)
MCHC: 31.9 g/dL (ref 30.0–36.0)
MCV: 85.6 fL (ref 78.0–100.0)
Platelets: 397 10*3/uL (ref 150–400)
RBC: 5.83 MIL/uL — AB (ref 4.22–5.81)
RDW: 15.1 % (ref 11.5–15.5)
WBC: 8.8 10*3/uL (ref 4.0–10.5)

## 2018-05-25 LAB — HEMOGLOBIN A1C
HEMOGLOBIN A1C: 5.6 % (ref 4.8–5.6)
Mean Plasma Glucose: 114.02 mg/dL

## 2018-05-25 LAB — PROTIME-INR
INR: 1.08
Prothrombin Time: 13.9 seconds (ref 11.4–15.2)

## 2018-05-25 LAB — LIPID PANEL
CHOL/HDL RATIO: 2.4 ratio
CHOLESTEROL: 125 mg/dL (ref 0–200)
HDL: 52 mg/dL (ref >40)
LDL Cholesterol: 62 mg/dL (ref 0–99)
Triglycerides: 54 mg/dL (ref <150)
VLDL: 11 mg/dL (ref 0–40)

## 2018-05-25 LAB — APTT: aPTT: 30 seconds (ref 24–36)

## 2018-05-25 LAB — ETHANOL: Alcohol, Ethyl (B): <10 mg/dL (ref <10)

## 2018-05-25 LAB — CBG MONITORING, ED: Glucose-Capillary: 82 mg/dL (ref 65–99)

## 2018-05-25 LAB — I-STAT TROPONIN, ED: Troponin i, poc: 0 ng/mL (ref 0.00–0.08)

## 2018-05-25 SURGERY — RADIOLOGY WITH ANESTHESIA
Anesthesia: General

## 2018-05-25 MED ORDER — EPHEDRINE SULFATE 50 MG/ML IJ SOLN
INTRAMUSCULAR | Status: DC | PRN
Start: 2018-05-25 — End: 2018-05-25
  Administered 2018-05-25: 15 mg via INTRAVENOUS

## 2018-05-25 MED ORDER — FENTANYL CITRATE (PF) 100 MCG/2ML IJ SOLN
INTRAMUSCULAR | Status: AC
  Filled 2018-05-25: qty 2

## 2018-05-25 MED ORDER — NITROGLYCERIN 1 MG/10 ML FOR IR/CATH LAB
INTRA_ARTERIAL | Status: AC
  Filled 2018-05-25: qty 10

## 2018-05-25 MED ORDER — NICARDIPINE HCL IN NACL 20-0.86 MG/200ML-% IV SOLN
0.0000 mg/h | INTRAVENOUS | Status: DC
  Administered 2018-05-25: 2 mg/h via INTRAVENOUS
  Administered 2018-05-25: 6 mg/h via INTRAVENOUS
  Administered 2018-05-25: 4 mg/h via INTRAVENOUS
  Administered 2018-05-25: 5 mg/h via INTRAVENOUS
  Administered 2018-05-25: 12.5 mg/h via INTRAVENOUS
  Administered 2018-05-25: 5 mg/h via INTRAVENOUS
  Administered 2018-05-25 (×2): 12.5 mg/h via INTRAVENOUS
  Filled 2018-05-25 (×3): qty 200
  Filled 2018-05-25: qty 400
  Filled 2018-05-25: qty 200

## 2018-05-25 MED ORDER — ACETAMINOPHEN 160 MG/5ML PO SOLN
650.0000 mg | ORAL | Status: DC | PRN
  Filled 2018-05-25: qty 20.3

## 2018-05-25 MED ORDER — CHLORHEXIDINE GLUCONATE 0.12 % MT SOLN
15.0000 mL | Freq: Two times a day (BID) | OROMUCOSAL | Status: DC
  Administered 2018-05-25 – 2018-05-26 (×3): 15 mL via OROMUCOSAL
  Filled 2018-05-25: qty 15

## 2018-05-25 MED ORDER — TICAGRELOR 90 MG PO TABS
90.0000 mg | ORAL_TABLET | Freq: Two times a day (BID) | ORAL | Status: DC
  Administered 2018-05-25 – 2018-05-29 (×9): 90 mg via ORAL
  Filled 2018-05-25 (×9): qty 1

## 2018-05-25 MED ORDER — ACETAMINOPHEN 325 MG PO TABS
650.0000 mg | ORAL_TABLET | ORAL | Status: DC | PRN
  Administered 2018-05-26 – 2018-05-29 (×2): 650 mg via ORAL
  Filled 2018-05-25 (×3): qty 2

## 2018-05-25 MED ORDER — ACETAMINOPHEN 160 MG/5ML PO SOLN: 650.0000 mg | ORAL | Status: DC | PRN

## 2018-05-25 MED ORDER — IOHEXOL 300 MG/ML  SOLN: 120.0000 mL | Freq: Once | INTRAMUSCULAR | Status: DC | PRN

## 2018-05-25 MED ORDER — CEFAZOLIN SODIUM-DEXTROSE 2-3 GM-%(50ML) IV SOLR
INTRAVENOUS | Status: DC | PRN
  Administered 2018-05-25: 2 g via INTRAVENOUS

## 2018-05-25 MED ORDER — LIDOCAINE HCL 1 % IJ SOLN
INTRAMUSCULAR | Status: AC
  Filled 2018-05-25: qty 20

## 2018-05-25 MED ORDER — SUGAMMADEX SODIUM 500 MG/5ML IV SOLN
INTRAVENOUS | Status: DC | PRN
  Administered 2018-05-25: 350 mg via INTRAVENOUS

## 2018-05-25 MED ORDER — SUCCINYLCHOLINE CHLORIDE 200 MG/10ML IV SOSY
PREFILLED_SYRINGE | INTRAVENOUS | Status: DC | PRN
  Administered 2018-05-25: 120 mg via INTRAVENOUS

## 2018-05-25 MED ORDER — ORAL CARE MOUTH RINSE
15.0000 mL | Freq: Two times a day (BID) | OROMUCOSAL | Status: DC
  Administered 2018-05-25 (×2): 15 mL via OROMUCOSAL

## 2018-05-25 MED ORDER — ASPIRIN 81 MG PO CHEW: 81.0000 mg | CHEWABLE_TABLET | Freq: Every day | ORAL | Status: DC

## 2018-05-25 MED ORDER — SODIUM CHLORIDE 0.9 % IV SOLN
INTRAVENOUS | Status: DC | PRN
  Administered 2018-05-25 (×3): via INTRAVENOUS

## 2018-05-25 MED ORDER — ROCURONIUM BROMIDE 10 MG/ML (PF) SYRINGE
PREFILLED_SYRINGE | INTRAVENOUS | Status: DC | PRN
  Administered 2018-05-25 (×2): 50 mg via INTRAVENOUS

## 2018-05-25 MED ORDER — CLOPIDOGREL BISULFATE 300 MG PO TABS
ORAL_TABLET | ORAL | Status: AC
  Filled 2018-05-25: qty 1

## 2018-05-25 MED ORDER — EPTIFIBATIDE 20 MG/10ML IV SOLN
INTRAVENOUS | Status: AC
  Filled 2018-05-25: qty 10

## 2018-05-25 MED ORDER — ASPIRIN 325 MG PO TABS
ORAL_TABLET | ORAL | Status: AC
  Filled 2018-05-25: qty 1

## 2018-05-25 MED ORDER — CEFAZOLIN SODIUM-DEXTROSE 2-4 GM/100ML-% IV SOLN
INTRAVENOUS | Status: AC
  Filled 2018-05-25: qty 100

## 2018-05-25 MED ORDER — NICARDIPINE HCL IN NACL 20-0.86 MG/200ML-% IV SOLN
INTRAVENOUS | Status: DC | PRN
  Administered 2018-05-25: 5 mg/h via INTRAVENOUS

## 2018-05-25 MED ORDER — TICAGRELOR 90 MG PO TABS: 90.0000 mg | ORAL_TABLET | Freq: Two times a day (BID) | ORAL | Status: DC

## 2018-05-25 MED ORDER — STROKE: EARLY STAGES OF RECOVERY BOOK
Freq: Once | Status: AC
  Administered 2018-05-25: 1
  Filled 2018-05-25: qty 1

## 2018-05-25 MED ORDER — SODIUM CHLORIDE 0.9 % IV SOLN
INTRAVENOUS | Status: DC
  Administered 2018-05-25: 23:00:00 via INTRAVENOUS

## 2018-05-25 MED ORDER — PROPOFOL 10 MG/ML IV BOLUS
INTRAVENOUS | Status: DC | PRN
  Administered 2018-05-25: 150 mg via INTRAVENOUS
  Administered 2018-05-25: 30 mg via INTRAVENOUS
  Administered 2018-05-25: 50 mg via INTRAVENOUS
  Administered 2018-05-25: 20 mg via INTRAVENOUS
  Administered 2018-05-25: 30 mg via INTRAVENOUS
  Administered 2018-05-25: 50 mg via INTRAVENOUS

## 2018-05-25 MED ORDER — ACETAMINOPHEN 650 MG RE SUPP: 650.0000 mg | RECTAL | Status: DC | PRN

## 2018-05-25 MED ORDER — TICAGRELOR 90 MG PO TABS
ORAL_TABLET | ORAL | Status: AC
  Administered 2018-05-25: 180 mg via OROGASTRIC
  Filled 2018-05-25: qty 2

## 2018-05-25 MED ORDER — ACETAMINOPHEN 325 MG PO TABS: 650.0000 mg | ORAL_TABLET | ORAL | Status: DC | PRN

## 2018-05-25 MED ORDER — TIROFIBAN HCL IN NACL 5-0.9 MG/100ML-% IV SOLN
INTRAVENOUS | Status: AC
  Filled 2018-05-25: qty 100

## 2018-05-25 MED ORDER — PHENYLEPHRINE HCL 10 MG/ML IJ SOLN
INTRAMUSCULAR | Status: DC | PRN
  Administered 2018-05-25: 40 ug via INTRAVENOUS
  Administered 2018-05-25: 80 ug via INTRAVENOUS
  Administered 2018-05-25: 40 ug via INTRAVENOUS
  Administered 2018-05-25: 80 ug via INTRAVENOUS

## 2018-05-25 MED ORDER — SODIUM CHLORIDE 0.9 % IV SOLN
INTRAVENOUS | Status: DC
  Administered 2018-05-25: 75 mL/h via INTRAVENOUS

## 2018-05-25 MED ORDER — LIDOCAINE 2% (20 MG/ML) 5 ML SYRINGE
INTRAMUSCULAR | Status: DC | PRN
  Administered 2018-05-25: 60 mg via INTRAVENOUS

## 2018-05-25 MED ORDER — ASPIRIN 81 MG PO CHEW
81.0000 mg | CHEWABLE_TABLET | Freq: Every day | ORAL | Status: DC
  Administered 2018-05-25 – 2018-05-29 (×5): 81 mg via ORAL
  Filled 2018-05-25 (×5): qty 1

## 2018-05-25 MED ORDER — IOPAMIDOL (ISOVUE-370) INJECTION 76%
100.0000 mL | Freq: Once | INTRAVENOUS | Status: AC | PRN
  Administered 2018-05-25: 100 mL via INTRAVENOUS

## 2018-05-25 MED ORDER — FENTANYL CITRATE (PF) 100 MCG/2ML IJ SOLN
25.0000 ug | INTRAMUSCULAR | Status: DC | PRN
  Administered 2018-05-25 (×3): 50 ug via INTRAVENOUS
  Filled 2018-05-25 (×3): qty 2

## 2018-05-25 MED ORDER — PHENYLEPHRINE HCL 10 MG/ML IJ SOLN
INTRAMUSCULAR | Status: DC | PRN
  Administered 2018-05-25: 25 ug/min via INTRAVENOUS

## 2018-05-25 MED ORDER — FENTANYL CITRATE (PF) 250 MCG/5ML IJ SOLN
INTRAMUSCULAR | Status: DC | PRN
  Administered 2018-05-25: 100 ug via INTRAVENOUS
  Administered 2018-05-25 (×2): 50 ug via INTRAVENOUS

## 2018-05-25 NOTE — Progress Notes (Signed)
Patient ID: [name], male   DOB: [birth year], 57 y.o.   MRN: [id number] INR. 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 Acute onsert of lt sided weakness. NIH 16   With rt sided gaze and  Lt hemianopia. CT brain . No ICH. 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 CTA severe RT MCA stenosiswith ? Sec thrombus . Known to have RT MCA severe stenosis on plavix and aspirin. CTP . CBF < 30 % vol 4ml. Tmax >6.0s vol 163 ml Mismatch 159 ml ratio of 40.8.Marland Kitchen.  Option of endovascular revascularization of RT MCA discussed with  patient who was fairly coherent and able to understand his  acute medical condition.The procedure,risks ,reasons and alternatives were reviewed. Risks of ICH of 10 %,with worsening neuro function ,vent dependency death,inabilty to revascularize  and  vascular injury were reviewed fully.Questions were  were answered to his understanding. Informed witnessed consent was obtained. for endovascular revascularization.  Patient was intubated before he could sign the consent form.. However the discussion patients permission to proceed was witnessed . Dr. Amada JupiterKirkpatrick and I co -signed the consent form .Marland Kitchen. Cory Harvey .MD

## 2018-05-25 NOTE — Progress Notes (Signed)
PT Cancellation Note  Patient Details Name: Cory Harvey MRN: [id number] DOB: 1961/11/05   Cancelled Treatment:    Reason Eval/Treat Not Completed: Patient not medically ready.  Pt will be on bedrest through the work day.  Will see 6/16 as able. 05/25/2018  Fredonia BingKen Annely Sliva, PT [phone]  (pager)   Cory Harvey 05/25/2018, 4:41 PM

## 2018-05-25 NOTE — ED Notes (Signed)
To ir

## 2018-05-25 NOTE — H&P (Signed)
Neurology Consultation Reason for Consult: Left Sided weakness Referring Physician: Preston Fleeting, D  CC: Left sided weakness  History is obtained from:patient  HPI: Cory Harvey is a 57 y.o. male who states that he started feeling abnormal around 4:30 pm. He was still doing his activities and moving well until around 8:45 pm on 6/14, however. At that time, he developed left sided weakness and EMS was called. A code stroke was activated and he arrived aroun 01:15 on 6/15.  On arrival, the patient had an NIH of 16 with severe left hemiparesis, right-sided gaze, left hemianopia.  This did improve after CT perfusion scan to an NIH of 8, but he still had significant deficits and then he began to wax and wane.  Given the severity of his deficits and the fact that he had already failed aspirin/Plavix/atorvastatin for his intracranial stenosis, he was taken for emergent intracranial angiography with consideration of intervention.    LKW: 4:30 pm tpa given?: no, out of window Premorbid modified rankin scale: 1  ROS: A 14 point ROS was performed and is negative except as noted in the HPI.   Past Medical History:  Diagnosis Date  . Depression   . History of CVA (cerebrovascular accident)   . Hypertension   . Stroke (HCC)    x 4  . Tobacco abuse      Family History  Problem Relation Age of Onset  . Diabetes Mother   . Heart disease Mother   . Hyperlipidemia Mother   . Hypertension Mother   . Stroke Mother   . Diabetes Father   . Hypertension Father   . Cancer Neg Hx      Social History:  reports that he has been smoking cigarettes.  He has been smoking about 0.50 packs per day. He has never used smokeless tobacco. He reports that he drinks alcohol. He reports that he does not use drugs.   Exam: Current vital signs: There were no vitals taken for this visit. Vital signs in last 24 hours: BP: ()/()  Arterial Line BP: ()/()    Physical Exam  Constitutional: Appears  well-developed and well-nourished.  Psych: Affect appropriate to situation Eyes: No scleral injection HENT: No OP obstrucion Head: Normocephalic.  Cardiovascular: Normal rate and regular rhythm.  Respiratory: Effort normal, non-labored breathing GI: Soft.  No distension. There is no tenderness.  Skin: WDI  Neuro: Mental Status: Patient is awake, alert, oriented to person, place, month, year, and situation. Patient is able to give a clear and coherent history. He appears to have some mild expressive aphasia, but is able to communicate fairly well.  He has a neglect that waxes and wanes in severity Cranial Nerves: II: Initially left field cut, but subsequently has full fields.  He continues to extinguish stimuli in the left vision. pupils are equal, round, and reactive to light.   III,IV, VI: Initially with right gaze preference, but subsequently EOMI without ptosis or diploplia.  V: Facial sensation is initially diminished on the left VII: Facial movement with left facial weakness VIII: hearing is intact to voice X: Uvula elevates symmetrically XI: Shoulder shrug is symmetric. XII: tongue is midline without atrophy or fasciculations.  Motor: Initially he had 1/5 strength of the left arm and 2/5 strength of the left leg, subsequently improving to 4/5 strength of both.  Then subsequently worsening to 2-3/5 strength in the left arm Sensory: Sensation is diminished on the left.  Initially he would not identify any stimuli to the left  is occurring on the right and extinguish double simultaneous stimulation.  Subsequently this resolved. Cerebellar: No clear ataxia in the right, unable to perform on the left  I have reviewed labs in epic and the results pertinent to this consultation are: CMP-borderline creatinine CBC-unremarkable  I have reviewed the images obtained: CT perfusion-large penumbra with small area of infarct in the right MCA territory  Impression: 57 year old male with what  I suspect is an acute on subacute/chronic MCA stenosis.  Given the distribution of his infarcts on his previous scan in April as well as suggestion of stenosis on a T1 image during that MRI I suspect that there was some degree of stenosis at that time.  He appeared patent in 2018.  Possibilities include large vessel intracranial atherosclerosis versus embolus with partial recanalization.  No matter the cause of his intracranial stenosis, I feel that he is failed medical therapy and would merit an intracranial stent at this point.  Given the degree of his symptoms, and the waxing and waning nature, it was decided to undertake evaluation for this with an angiogram emergently.  I do not feel that he needs a repeat echo given that he had a TEE in April.  He continues to smoke and I advised him in no uncertain terms that it would be in his best interest to stop smoking.  Recommendations: 1) to IR for endovascular intervention 2) lipids, A1c 3) admit to ICU following intervention 4) frequent neuro checks 5) MRI brain 6) antiplatelets depending on intervention(likely aspirin and Brilinta) 7) continue atorvastatin 20 mg nightly 8) hold losartan  9) stroke team to follow   This patient is critically ill and at significant risk of neurological worsening, death and care requires constant monitoring of vital signs, hemodynamics,respiratory and cardiac monitoring, neurological assessment, discussion with family, other specialists and medical decision making of high complexity. I spent 60 minutes of neurocritical care time  in the care of  this patient.  Ritta SlotMcNeill Gisela Lea, MD [facility] [phone]  If 7pm- 7am, please page neurology on call as listed in AMION. 05/25/2018  3:13 AM

## 2018-05-25 NOTE — Progress Notes (Signed)
STROKE TEAM PROGRESS NOTE   HISTORY OF PRESENT ILLNESS (per record) Cory Harvey is a 57 y.o. male who states that he started feeling abnormal around 4:30 pm. He was still doing his activities and moving well until around 8:45 pm on 6/14, however. At that time, he developed left sided weakness and EMS was called. A code stroke was activated and he arrived around 01:15 on 6/15.  On arrival, the patient had an NIH of 16 with severe left hemiparesis, right-sided gaze, left hemianopia.  This did improve after CT perfusion scan to an NIH of 8, but he still had significant deficits and then he began to wax and wane. Given the severity of his deficits and the fact that he had already failed aspirin/Plavix/atorvastatin for his intracranial stenosis, he was taken for emergent intracranial angiography with consideration of intervention.  LKW: 4:30 pm tpa given?: no, out of window Premorbid modified rankin scale: 1  IR Cerebral Angiogram and Intervention - Dr Corliss Skainseveshwar S/P rt common carotid arteriogram RT CFA approach. FIndings  1.Severe irregular stenosis of RT MCA inf division mid  M2 branch,and sig irregular stenosis of RT MCA mid M1  due to long seg  Irregular ICAD  plaque ,S/P primary stenting with achievement of TICI 3 reperfusion.     SUBJECTIVE (INTERVAL HISTORY) He is irritated and trying to get out of bed but reports no specific complaints.  He is confused.    OBJECTIVE Temp:  [97 F (36.1 C)-97.8 F (36.6 C)] 97.5 F (36.4 C) (06/15 0800) Pulse Rate:  [72-97] 79 (06/15 0930) Cardiac Rhythm: Normal sinus rhythm (06/15 0800) Resp:  [13-24] 18 (06/15 0930) BP: (104-144)/(70-94) 104/76 (06/15 0930) SpO2:  [91 %-100 %] 94 % (06/15 0930) Arterial Line BP: (122-175)/(58-96) 122/63 (06/15 0930) Weight:  [173 lb (78.5 kg)-173 lb 4.5 oz (78.6 kg)] 173 lb 4.5 oz (78.6 kg) (06/15 0649)  CBC:  Recent Labs  Lab 05/25/18 0124 05/25/18 0140  WBC 8.8  --   NEUTROABS 6.6  --   HGB  15.9 17.3*  HCT 49.9 51.0  MCV 85.6  --   PLT 397  --     Basic Metabolic Panel:  Recent Labs  Lab 05/25/18 0124 05/25/18 0140  NA 140 141  K 3.8 3.6  CL 108 105  CO2 22  --   GLUCOSE 90 87  BUN 10 11  CREATININE 1.25* 1.20  CALCIUM 9.3  --     Lipid Panel:     Component Value Date/Time   CHOL 132 03/27/2018 0647   CHOL 150 09/21/2014 0408   TRIG 49 03/27/2018 0647   TRIG 40 09/21/2014 0408   HDL 49 03/27/2018 0647   HDL 76 (H) 09/21/2014 0408   CHOLHDL 2.7 03/27/2018 0647   VLDL 10 03/27/2018 0647   VLDL 8 09/21/2014 0408   LDLCALC 73 03/27/2018 0647   LDLCALC 66 09/21/2014 0408   HgbA1c:  Lab Results  Component Value Date   HGBA1C 5.6 05/25/2018   Urine Drug Screen: No results found for: LABOPIA, COCAINSCRNUR, LABBENZ, AMPHETMU, THCU, LABBARB  Alcohol Level     Component Value Date/Time   ETH <10 05/25/2018 0124    IMAGING  Ct Angio Head and Neck W Or Wo Contrast 05/25/2018  CTA NECK:  1. No hemodynamically significant stenosis carotid arteries. Patent vertebral arteries.   CTA HEAD:  1. Mild segment RIGHT M1 severe stenosis, given prior image this suggest pre-existing stenosis propagation, possible acute component.  2. Occluded distal azygos  ACA with poor collateralization.  3. Tandem severe stenosis versus focally occluded LEFT posterior cerebral artery.  Occluded RIGHT P3 segment.   CT PERFUSION:  1. Small RIGHT parietal lobe infarct with confluent RIGHT frontoparietal/PCA and posterior watershed penumbra. Aortic Atherosclerosis (ICD10-I70.0).    Ct Head Code Stroke Wo Contrast 05/25/2018 IMPRESSION:  1. No acute intracranial process.  2. ASPECTS is 10.  3. Old small pontine, basal ganglia and thalamus infarcts. Moderate to severe chronic small vessel ischemic changes.  4. Old LEFT insular/parietal lobe, MCA territory infarct.  5. Moderate parenchymal brain volume loss.    IR Cerebral Angiogram and Intervention - Dr  Marcell Anger 05/25/2018 S/P rt common carotid arteriogram RT CFA approach. FIndings  1.Severe irregular stenosis of RT MCA inf division mid  M2 branch,and sig irregular stenosis of RT MCA mid M1  due to long seg  Irregular ICAD  plaque ,S/P primary stenting with achievement of TICI 3 reperfusion.    [name] - pending    Transthoracic Echocardiogram - pending 00/00/00      PHYSICAL EXAM Vitals:   05/25/18 0845 05/25/18 0900 05/25/18 0915 05/25/18 0930  BP: 117/73 115/70 113/71 104/76  Pulse: 75 81 80 79  Resp: 13 18 20 18   Temp:      TempSrc:      SpO2: 96% 95% 95% 94%  Weight:      Height:        GENERAL: He is irritated and try to get out of bed.  HEENT: Normal  ABDOMEN: soft  EXTREMITIES: No edema   BACK: Normal  SKIN: Normal by inspection.    MENTAL STATUS: He is awake and alert.  He has a rather severe dysarthria.  He is somewhat confused at time but knows that he is in the hospital in [place] at the [facility].  CRANIAL NERVES: Pupils are equal, round and reactive to light and accomodation; extra ocular movements are full, there is no significant nystagmus; visual fields are full; upper and lower facial muscles are normal in strength and symmetric, there is no flattening of the nasolabial folds; tongue is midline; uvula is midline; shoulder elevation is normal.  MOTOR: Fluctuating examination but most consistently the right leg is 4/5 and the right upper extremity 4/5.  Left leg 5 and left upper extremity 2/5.   COORDINATION: No clear dysmetria or tremors noted or ataxia.          ASSESSMENT/PLAN Cory Harvey is a 57 y.o. male with history of previous stroke, tobacco use, hypertension, and depression presenting with left-sided weakness. He did not receive IV t-PA due to late presentation. IR - Irregular Rt ICAD  plaque ,S/P primary stenting  Stroke:  MRI pending  Resultant   Severe and left hemiparesis.  CT head - No acute  intracranial process. - multiple old infarcts  MRI head - pending  MRA head - not performed  CTA H&N - RIGHT M1 severe stenosis. Occluded distal azygos ACA. Occluded RIGHT P3 segment.   Tandem severe stenosis versus focally occluded LEFT posterior cerebral artery.  Carotid Doppler - CTA neck  TEE Echo - 03/27/2018 - EF 50 - 55%. No cardiac source of emboli identified.  LDL - pending  HgbA1c - pending  VTE prophylaxis - SCDs Diet Order           Diet NPO time specified  Diet effective now          aspirin 325 mg daily and clopidogrel 75 mg daily prior  to admission, now on aspirin 81 mg daily and Brilinta  Patient counseled to be compliant with his antithrombotic medications  Ongoing aggressive stroke risk factor management  Therapy recommendations:  pending  Disposition:  Pending  Hypertension  Stable - BP tends to run low . Permissive hypertension (OK if < 220/120) but gradually normalize in 5-7 days . Long-term BP goal normotensive  Hyperlipidemia  Lipid lowering medication PTA:  Lipitor 10 mg daily  LDL pending, goal < 70  Current lipid lowering medication: none  Continue statin at discharge  Other Stroke Risk Factors  Cigarette smoker - advised to stop smoking  ETOH use, advised to drink no more than 1 alcoholic beverage per day.\  Hx stroke/TIA  Family hx stroke (mother)   Other Active Problems       Hospital day # 0    To contact Stroke Continuity provider, please refer to [email or web address]. After hours, contact General Neurology

## 2018-05-25 NOTE — Transfer of Care (Signed)
Immediate Anesthesia Transfer of Care Note  Patient: Cory Harvey  Procedure(s) Performed: RADIOLOGY WITH ANESTHESIA (N/A )  Patient Location: PACU  Anesthesia Type:General  Level of Consciousness: awake and pateint uncooperative  Airway & Oxygen Therapy: Patient connected to face mask oxygen  Post-op Assessment: Report given to RN and Post -op Vital signs reviewed and stable  Post vital signs: Reviewed and stable  Last Vitals:  Vitals Value Taken Time  BP 150/110 05/25/2018  6:03 AM  Temp    Pulse 99 05/25/2018  6:04 AM  Resp 26 05/25/2018  6:04 AM  SpO2 99 % 05/25/2018  6:04 AM  Vitals shown include unvalidated device data.  Last Pain: There were no vitals filed for this visit.       Complications: No apparent anesthesia complications

## 2018-05-25 NOTE — Procedures (Signed)
S/P rt common carotid arteriogram RT CFA approach. FIndings  1.severe irregular stenosis of RT MCA inf division mid  M2 branch,and sig irregular stenosis of RT MCA mid M1  due to long seg  Irregular ICAD  plaque ,S/P primary stenting with achievement of TICI 3 reperfusion.

## 2018-05-25 NOTE — Anesthesia Procedure Notes (Signed)
Arterial Line Insertion Start/End6/15/2019 2:30 AM, 05/25/2018 2:40 AM Performed by: Molli HazardGordon, Braydee Shimkus M, CRNA, CRNA  Patient location: OR. Left, radial was placed Catheter size: 20 G Hand hygiene performed  and maximum sterile barriers used   Attempts: 1 Procedure performed without using ultrasound guided technique. Following insertion, dressing applied and Biopatch. Patient tolerated the procedure well with no immediate complications.

## 2018-05-25 NOTE — Anesthesia Procedure Notes (Signed)
Procedure Name: Intubation Date/Time: 05/25/2018 2:36 AM Performed by: Clearnce Sorrel, CRNA Pre-anesthesia Checklist: Patient identified, Emergency Drugs available, Suction available, Patient being monitored and Timeout performed Patient Re-evaluated:Patient Re-evaluated prior to induction Oxygen Delivery Method: Circle system utilized Preoxygenation: Pre-oxygenation with 100% oxygen Induction Type: IV induction, Rapid sequence and Cricoid Pressure applied Laryngoscope Size: Mac and 4 Grade View: Grade I Tube type: Subglottic suction tube Tube size: 7.5 mm Number of attempts: 1 Airway Equipment and Method: Stylet Placement Confirmation: ETT inserted through vocal cords under direct vision,  breath sounds checked- equal and bilateral and positive ETCO2 Secured at: 23 cm Tube secured with: Tape Dental Injury: Teeth and Oropharynx as per pre-operative assessment

## 2018-05-25 NOTE — ED Notes (Addendum)
0124 pt has been feeling strange since 1630 today at 2045 [facility] ems arrived  When he arrived here the pt had a lt siderd stare lt sided weakness  Speech garbled.  Some improvement in his symptoms. [id number].    To ir 0230

## 2018-05-25 NOTE — ED Provider Notes (Signed)
[facility]   CSN: [id number] Arrival date & time: 05/25/18  0124     History   Chief Complaint Stroke  HPI Lynnda ChildWillie James Foulks is a 57 y.o. male.  The history is provided by the patient.  He has history of stroke, hypertension, depression and comes in as a code stroke.  He was noted to have some weakness of his left arm at about 8:45 PM.  However, patient actually states that he has been having some problems with his left side since about 4:30 PM.  He denies headache.  Past Medical History:  Diagnosis Date  . Depression   . History of CVA (cerebrovascular accident)   . Hypertension   . Stroke (HCC)    x 4  . Tobacco abuse     Patient Active Problem List   Diagnosis Date Noted  . Cerebrovascular accident (CVA) involving right cerebral hemisphere (HCC) 03/29/2018  . CVA (cerebral vascular accident) (HCC) 03/26/2018  . Acute CVA (cerebrovascular accident) (HCC) 09/02/2017  . Gait disturbance 09/02/2017  . History of CVA (cerebrovascular accident) 04/18/2017  . Tobacco use 04/18/2017  . Depression   . Hypertension   . Stroke [facility](HCC)     Past Surgical History:  Procedure Laterality Date  . kidney surgery Left 1983  . TEE WITHOUT CARDIOVERSION N/A 03/27/2018   Procedure: TRANSESOPHAGEAL ECHOCARDIOGRAM (TEE);  Surgeon: Dalia HeadingFath, Kenneth A, MD;  Location: [facility] ORS;  Service: Cardiovascular;  Laterality: N/A;        Home Medications    Prior to Admission medications   Medication Sig Start Date End Date Taking? Authorizing Provider  aspirin 325 MG EC tablet Take 1 tablet (325 mg total) by mouth daily. 04/04/18   Love, Evlyn KannerPamela S, PA-C  atorvastatin (LIPITOR) 10 MG tablet Take 2 tablets (20 mg total) by mouth daily at 6 PM. 04/04/18   Love, Evlyn KannerPamela S, PA-C  clopidogrel (PLAVIX) 75 MG tablet Take 1 tablet (75 mg total) by mouth daily. 04/04/18   Love, Evlyn KannerPamela S, PA-C  gabapentin (NEURONTIN) 100 MG capsule Take 1 capsule (100 mg  total) by mouth at bedtime. 04/04/18   Love, Evlyn KannerPamela S, PA-C  losartan (COZAAR) 25 MG tablet Take 1 tablet (25 mg total) by mouth daily. 04/04/18   Jacquelynn CreeLove, Pamela S, PA-C    Family History Family History  Problem Relation Age of Onset  . Diabetes Mother   . Heart disease Mother   . Hyperlipidemia Mother   . Hypertension Mother   . Stroke Mother   . Diabetes Father   . Hypertension Father   . Cancer Neg Hx     Social History Social History   Tobacco Use  . Smoking status: Current Every Day Smoker    Packs/day: 0.50    Types: Cigarettes  . Smokeless tobacco: Never Used  Substance Use Topics  . Alcohol use: Yes    Comment: occasional   . Drug use: No     Allergies   Patient has no known allergies.   Review of Systems Review of Systems  All other systems reviewed and are negative.    Physical Exam Updated Vital Signs BP (!) 130/94   Pulse 77   Temp 97.8 F (36.6 C)   Resp 18   SpO2 95%   Physical Exam  Nursing note and vitals reviewed.  57 year old male, resting comfortably and in no acute distress. Vital signs are significant for elevated diastolic blood pressure. Oxygen saturation is 95%, which  is normal. Head is normocephalic and atraumatic. PERRLA, EOMI. Oropharynx is clear. Neck is nontender and supple without adenopathy or JVD. Back is nontender and there is no CVA tenderness. Lungs are clear without rales, wheezes, or rhonchi. Chest is nontender. Heart has regular rate and rhythm without murmur. Abdomen is soft, flat, nontender without masses or hepatosplenomegaly and peristalsis is normoactive. Extremities have no cyanosis or edema, full range of motion is present. Skin is warm and dry without rash. Neurologic: Awake and alert.  Speech is mildly dysarthric.  Gaze preference to the right.  Fairly dense left hemiparesis noted.  ED Treatments / Results  Labs (all labs ordered are listed, but only abnormal results are displayed) Labs Reviewed  CBC -  Abnormal; Notable for the following components:      Result Value   RBC 5.83 (*)    All other components within normal limits  COMPREHENSIVE METABOLIC PANEL - Abnormal; Notable for the following components:   Creatinine, Ser 1.25 (*)    AST 13 (*)    ALT 12 (*)    All other components within normal limits  I-STAT CHEM 8, ED - Abnormal; Notable for the following components:   TCO2 21 (*)    Hemoglobin 17.3 (*)    All other components within normal limits  ETHANOL  DIFFERENTIAL  PROTIME-INR  APTT  RAPID URINE DRUG SCREEN, HOSP PERFORMED  URINALYSIS, ROUTINE W REFLEX MICROSCOPIC  I-STAT TROPONIN, ED  CBG MONITORING, ED    EKG None  Radiology Ct Angio Head W Or Wo Contrast  Result Date: 05/25/2018 CLINICAL DATA:  Follow up code stroke. LEFT-sided weakness and slurred speech. Last seen normal at 2045 hours. History of stroke, hypertension. EXAM: CT ANGIOGRAPHY HEAD AND NECK CT PERFUSION BRAIN TECHNIQUE: Multidetector CT imaging of the head and neck was performed using the standard protocol during bolus administration of intravenous contrast. Multiplanar CT image reconstructions and MIPs were obtained to evaluate the vascular anatomy. Carotid stenosis measurements (when applicable) are obtained utilizing NASCET criteria, using the distal internal carotid diameter as the denominator. Multiphase CT imaging of the brain was performed following IV bolus contrast injection. Subsequent parametric perfusion maps were calculated using RAPID software. CONTRAST:  ISOVUE-370 IOPAMIDOL (ISOVUE-370) INJECTION 76% COMPARISON:  CT HEAD May 25, 2018, carotid ultrasound March 27, 2018, MRI September 02, 2017 and March 27, 2018. FINDINGS: CTA NECK FINDINGS: AORTIC ARCH: Normal appearance of the thoracic arch, normal branch pattern. Mild calcific atherosclerosis. The origins of the innominate, left Common carotid artery and subclavian artery are widely patent. RIGHT CAROTID SYSTEM: Common carotid artery  is patent. Mild intimal thickening calcific atherosclerosis of the carotid bifurcation without hemodynamically significant stenosis by NASCET criteria. Normal appearance of the internal carotid artery. LEFT CAROTID SYSTEM: Common carotid artery is patent. Mild intimal thickening calcific atherosclerosis of the carotid bifurcation without hemodynamically significant stenosis by NASCET criteria. Normal appearance of the internal carotid artery. VERTEBRAL ARTERIES:Left vertebral artery is dominant. Patent bilateral vertebral arteries, mild extrinsic deformity degenerative cervical spine. SKELETON: No acute osseous process though bone windows have not been submitted. Patient is edentulous. Moderate to severe C3-4 through C6-7 spondylosis. Moderate to severe RIGHT C2-3 and multilevel moderate neural foraminal narrowing. OTHER NECK: Soft tissues of the neck are nonacute though, not tailored for evaluation. UPPER CHEST: Included lung apices are clear. No superior mediastinal lymphadenopathy. CTA HEAD FINDINGS: ANTERIOR CIRCULATION: Patent cervical internal carotid arteries, petrous, cavernous and supra clinoid internal carotid arteries. Dolichoectatic ICA seen with chronic hypertension. 1 cm  segment RIGHT M1 severe stenosis. Tandem severe stenosis versus occluded mid to distal azygos ACA. No contrast extravasation or aneurysm. POSTERIOR CIRCULATION: Patent vertebral arteries, vertebrobasilar junction and basilar artery, as well as main branch vessels. Partially fenestrated proximal basilar artery. Tandem severe stenosis occluded LEFT posterior cerebral artery, fetal origin. Occluded RIGHT P3 segment. No large vessel occlusion, contrast extravasation or aneurysm. VENOUS SINUSES: Major dural venous sinuses are patent though not tailored for evaluation on this angiographic examination. ANATOMIC VARIANTS: Azygos ACA. DELAYED PHASE: Not performed. MIP images reviewed. CT Brain Perfusion Findings: CBF (<30%) Volume: 4mL  Perfusion (Tmax>6.0s) volume: Mismatch Volume: Infarction Location:RIGHT frontoparietal lobes, to lesser extent LEFT parietal lobe. Hypoperfusion index is 0.2. IMPRESSION: CTA NECK: 1. No hemodynamically significant stenosis carotid arteries. Patent vertebral arteries. CTA HEAD: 1. Mild segment RIGHT M1 severe stenosis, given prior image this suggest pre-existing stenosis propagation, possible acute component. 2. Occluded distal azygos ACA with poor collateralization. 3. Tandem severe stenosis versus focally occluded LEFT posterior cerebral artery. Occluded RIGHT P3 segment. CT PERFUSION: 1. Small RIGHT parietal lobe infarct with confluent RIGHT frontoparietal/PCA and posterior watershed penumbra. Aortic Atherosclerosis (ICD10-I70.0). Critical Value/emergent results were called by telephone at the time of interpretation on 05/25/2018 at 2:10 am to Dr. Ritta Slot , who verbally acknowledged these results. Electronically Signed   By: Awilda Metro M.D.   On: 05/25/2018 02:17   Ct Head Code Stroke Wo Contrast  Result Date: 05/25/2018 CLINICAL DATA:  Code stroke. LEFT-sided weakness and slurred speech. Last seen normal at 2045 hours. History of stroke, hypertension. EXAM: CT HEAD WITHOUT CONTRAST TECHNIQUE: Contiguous axial images were obtained from the base of the skull through the vertex without intravenous contrast. COMPARISON:  MRI head March 26, 2018 FINDINGS: BRAIN: No intraparenchymal hemorrhage, mass effect nor midline shift. Old pontine infarct. Old bilateral basal ganglia and thalami infarcts. Old LEFT posterior insula to parietal infarct. Confluent supratentorial white matter hypodensities. Moderate parenchymal brain volume loss. No hydrocephalus. No abnormal extra-axial fluid collections. VASCULAR: Unremarkable. SKULL/SOFT TISSUES: No skull fracture. No significant soft tissue swelling. ORBITS/SINUSES: The included ocular globes and orbital contents are normal.Mild paranasal sinus  mucosal thickening. Mastoid air cells are well aerated. Soft tissue within the RIGHT external auditory canal most compatible with cerumen. OTHER: Patient is edentulous. ASPECTS [facility] Stroke Program Early CT Score) - Ganglionic level infarction (caudate, lentiform nuclei, internal capsule, insula, M1-M3 cortex): 7 - Supraganglionic infarction (M4-M6 cortex): 3 Total score (0-10 with 10 being normal): 10 IMPRESSION: 1. No acute intracranial process. 2. ASPECTS is 10. 3. Old small pontine, basal ganglia and thalamus infarcts. Moderate to severe chronic small vessel ischemic changes. 4. Old LEFT insular/parietal lobe, MCA territory infarct. 5. Moderate parenchymal brain volume loss. 6. Critical Value/emergent results text paged to Dr.MCNEILL [facility] via AMION secure system on 05/25/2018 at 1:38 am, including interpreting physician's phone number. Electronically Signed   By: Awilda Metro M.D.   On: 05/25/2018 01:42    Procedures Procedures  CRITICAL CARE Performed by: Dione Booze Total critical care time: 35 minutes Critical care time was exclusive of separately billable procedures and treating other patients. Critical care was necessary to treat or prevent imminent or life-threatening deterioration. Critical care was time spent personally by me on the following activities: development of treatment plan with patient and/or surrogate as well as nursing, discussions with consultants, evaluation of patient's response to treatment, examination of patient, obtaining history from patient or surrogate, ordering and performing treatments and interventions, ordering and review of laboratory  studies, ordering and review of radiographic studies, pulse oximetry and re-evaluation of patient's condition.  Medications Ordered in ED Medications  fentaNYL (SUBLIMAZE) 100 MCG/2ML injection (has no administration in time range)  tirofiban (AGGRASTAT) 5-0.9 MG/100ML-% injection (has no administration in time range)   ticagrelor (BRILINTA) 90 MG tablet (has no administration in time range)  aspirin 325 MG tablet (has no administration in time range)  clopidogrel (PLAVIX) 300 MG tablet (has no administration in time range)  lidocaine (XYLOCAINE) 1 % (with pres) injection (has no administration in time range)  nitroGLYCERIN 100 MCG/ML intra-arterial injection (has no administration in time range)  eptifibatide (INTEGRILIN) 20 MG/10ML injection (has no administration in time range)  iopamidol (ISOVUE-370) 76 % injection 100 mL (100 mLs Intravenous Contrast Given 05/25/18 0141)   Initial Impression / Assessment and Plan / ED Course  I have reviewed the triage vital signs and the nursing notes.  Pertinent labs & imaging results that were available during my care of the patient were reviewed by me and considered in my medical decision making (see chart for details).  Acute stroke.  Patient seen in conjunction with Dr. Amada Jupiter of neurology service.  Patient has been sent to interventional radiology following CT scans.  Final Clinical Impressions(s) / ED Diagnoses   Final diagnoses:  Cerebrovascular accident (CVA) due to thrombosis of cerebral artery [facility])    ED Discharge Orders    None       Dione Booze, MD 05/25/18 0230

## 2018-05-26 ENCOUNTER — Other Ambulatory Visit: Payer: Self-pay

## 2018-05-26 ENCOUNTER — Encounter (HOSPITAL_COMMUNITY): Payer: Self-pay | Admitting: *Deleted

## 2018-05-26 LAB — URINALYSIS, ROUTINE W REFLEX MICROSCOPIC
BACTERIA UA: NONE SEEN
Bilirubin Urine: NEGATIVE
GLUCOSE, UA: NEGATIVE mg/dL
KETONES UR: 5 mg/dL — AB
Leukocytes, UA: NEGATIVE
Nitrite: NEGATIVE
PROTEIN: NEGATIVE mg/dL
Specific Gravity, Urine: 1.014 (ref 1.005–1.030)
pH: 5 (ref 5.0–8.0)

## 2018-05-26 LAB — CBC WITH DIFFERENTIAL/PLATELET
ABS IMMATURE GRANULOCYTES: 0 10*3/uL (ref 0.0–0.1)
BASOS ABS: 0.1 10*3/uL (ref 0.0–0.1)
BASOS PCT: 1 %
Eosinophils Absolute: 0.3 10*3/uL (ref 0.0–0.7)
Eosinophils Relative: 3 %
HCT: 43.2 % (ref 39.0–52.0)
Hemoglobin: 14 g/dL (ref 13.0–17.0)
IMMATURE GRANULOCYTES: 0 %
Lymphocytes Relative: 14 %
Lymphs Abs: 1.2 10*3/uL (ref 0.7–4.0)
MCH: 27.1 pg (ref 26.0–34.0)
MCHC: 32.4 g/dL (ref 30.0–36.0)
MCV: 83.7 fL (ref 78.0–100.0)
Monocytes Absolute: 0.7 10*3/uL (ref 0.1–1.0)
Monocytes Relative: 8 %
NEUTROS PCT: 74 %
Neutro Abs: 6.4 10*3/uL (ref 1.7–7.7)
PLATELETS: 362 10*3/uL (ref 150–400)
RBC: 5.16 MIL/uL (ref 4.22–5.81)
RDW: 14.9 % (ref 11.5–15.5)
WBC: 8.6 10*3/uL (ref 4.0–10.5)

## 2018-05-26 LAB — BASIC METABOLIC PANEL
ANION GAP: 6 (ref 5–15)
CO2: 23 mmol/L (ref 22–32)
Calcium: 8.3 mg/dL — ABNORMAL LOW (ref 8.9–10.3)
Chloride: 110 mmol/L (ref 101–111)
Creatinine, Ser: 1.03 mg/dL (ref 0.61–1.24)
Glucose, Bld: 79 mg/dL (ref 65–99)
POTASSIUM: 3.5 mmol/L (ref 3.5–5.1)
SODIUM: 139 mmol/L (ref 135–145)

## 2018-05-26 LAB — RAPID URINE DRUG SCREEN, HOSP PERFORMED
AMPHETAMINES: NOT DETECTED
BENZODIAZEPINES: NOT DETECTED
Cocaine: POSITIVE — AB
OPIATES: NOT DETECTED
Tetrahydrocannabinol: NOT DETECTED

## 2018-05-26 LAB — MRSA PCR SCREENING: MRSA BY PCR: NEGATIVE

## 2018-05-26 MED ORDER — ATORVASTATIN CALCIUM 10 MG PO TABS
10.0000 mg | ORAL_TABLET | Freq: Every day | ORAL | Status: DC
  Administered 2018-05-26 – 2018-05-28 (×3): 10 mg via ORAL
  Filled 2018-05-26 (×3): qty 1

## 2018-05-26 MED ORDER — CHLORHEXIDINE GLUCONATE 0.12 % MT SOLN
OROMUCOSAL | Status: AC
  Filled 2018-05-26: qty 15

## 2018-05-26 NOTE — Progress Notes (Signed)
Pt developed hematoma to R groin.  Pt complaining of tenderness at site.  RN held pressure for 20 minutes, called Dr. Corliss Skainseveshwar to make him aware.  Groin site much softer.  Pt to lay flat for 4 hrs until 10pm.  Site redressed with pressure tape.  Will continue to monitor.

## 2018-05-26 NOTE — Evaluation (Signed)
Physical Therapy Evaluation Patient Details Name: Cory Harvey MRN: [id number] DOB: 1961-05-04 Today's Date: 05/26/2018   History of Present Illness  Pt is a 57 y/o male who states that he started feeling abnormal around 4:30 pm on 6/14. A code stroke was activated and he arrived to the ED around 01:15 on 6/15 after onset of L sided weakness. On arrival, the patient had an NIH of 16 with severe left hemiparesis, right-sided gaze, left hemianopia. This did improve after CT perfusion scan to an NIH of 8, but he still had significant deficits and then he began to wax and wane.  Given the severity of his deficits and the fact that he had already failed aspirin/Plavix/atorvastatin for his intracranial stenosis, he was taken for emergent intracranial angiography with consideration of intervention. MRI revealed scattered small acute/early subacute infarcts of the R posterior frontal lobe, R parietal lobe, R posterior cingulate gyrus, and R corpus collosum. MRI also showed punctate acute/early subacute infarcts in the R post temporal lobe, L superior cerebellum, and L posterior limb of the internal capsule.   Clinical Impression  Pt admitted with above diagnosis. Pt currently with functional limitations due to the deficits listed below (see PT Problem List). At the time of PT eval pt was able to perform transfers and ambulation with up to +2 mod assist for balance support, safety, and chair follow. He was able to initiate uncoordinated movement of the LUE better in sitting than when supine in bed. Noted L side inattention and minimal tracking of L eye past midline. With increased time and cues, pt was able to find objects on the L side of the hall that therapist was describing. Pt reports he is seeing double and glasses were very fragile with lenses glued into the frames. Recommend continued therapy at the CIR level to maximize functional independence and return to PLOF. Acutely, pt will benefit from skilled  PT to increase their independence and safety with mobility to allow discharge to the venue listed below.       Follow Up Recommendations CIR;Supervision/Assistance - 24 hour    Equipment Recommendations  Rolling walker with 5" wheels;3in1 (PT)    Recommendations for Other Services Rehab consult     Precautions / Restrictions Precautions Precautions: Fall Restrictions Weight Bearing Restrictions: No      Mobility  Bed Mobility Overal bed mobility: Needs Assistance Bed Mobility: Supine to Sit     Supine to sit: Mod assist     General bed mobility comments: HOB flat. Pt was able to transition to EOB with assist for trunk elevation to full sitting position. Advanced LE's to edge and scooted hips well.   Transfers Overall transfer level: Needs assistance Equipment used: 2 person hand held assist Transfers: Sit to/from Stand Sit to Stand: Mod assist;+2 safety/equipment         General transfer comment: Min assist to power-up to full standing position however mod assist provided for balance support and safety.   Ambulation/Gait Ambulation/Gait assistance: Mod assist;+2 safety/equipment Gait Distance (Feet): 150 Feet Assistive device: 1 person hand held assist Gait Pattern/deviations: Wide base of support;Decreased stride length;Step-through pattern;Shuffle Gait velocity: Decreased Gait velocity interpretation: <1.8 ft/sec, indicate of risk for recurrent falls General Gait Details: Noted externally rotated LLE and overall antalgic gait. Pt had difficulty coordinating smooth gait pattern. As distance progressed feel pt improved but mod assist required initially for balance and sequencing.    Stairs            Wheelchair  Mobility    Modified Rankin (Stroke Patients Only) Modified Rankin (Stroke Patients Only) Pre-Morbid Rankin Score: No significant disability Modified Rankin: Moderately severe disability     Balance Overall balance assessment: Needs  assistance Sitting-balance support: Feet supported;No upper extremity supported Sitting balance-Leahy Scale: Fair     Standing balance support: Bilateral upper extremity supported;During functional activity Standing balance-Leahy Scale: Poor Standing balance comment: Reliant on UE support and therapist assist at gait belt for dynamic balance activity.                              Pertinent Vitals/Pain Pain Assessment: No/denies pain    Home Living Family/patient expects to be discharged to:: Private residence Living Arrangements: Alone Available Help at Discharge: Available PRN/intermittently Type of Home: House Home Access: Stairs to enter Entrance Stairs-Rails: Can reach both Entrance Stairs-Number of Steps: 6 Home Layout: One level Home Equipment: None      Prior Function Level of Independence: Independent         Comments: Pt reports he was not working, however was independent PTA     Hand Dominance        Extremity/Trunk Assessment   Upper Extremity Assessment Upper Extremity Assessment: Defer to OT evaluation;LUE deficits/detail LUE Deficits / Details: Decreased strength and AROM consistent with above mentioned diagnosis.     Lower Extremity Assessment Lower Extremity Assessment: LLE deficits/detail LLE Deficits / Details: with MMT, strength appears great - 5/5 quads, 4/5 hamstrings, 4/5 hip flexors. However, very uncoordinated especially with OOB mobility/ambulation.     Cervical / Trunk Assessment Cervical / Trunk Assessment: Other exceptions Cervical / Trunk Exceptions: Forward head posture with rounded shoulders  Communication   Communication: Expressive difficulties  Cognition Arousal/Alertness: Awake/alert(lethargic towards end of session with fatigue) Behavior During Therapy: Flat affect Overall Cognitive Status: Impaired/Different from baseline Area of Impairment: Orientation;Attention;Memory;Following  commands;Safety/judgement;Awareness;Problem solving                 Orientation Level: Disoriented to;Place;Time;Situation Current Attention Level: Sustained Memory: Decreased short-term memory Following Commands: Follows one step commands inconsistently;Follows multi-step commands inconsistently Safety/Judgement: Decreased awareness of safety;Decreased awareness of deficits Awareness: Intellectual Problem Solving: Slow processing;Decreased initiation;Difficulty sequencing;Requires verbal cues;Requires tactile cues        General Comments      Exercises     Assessment/Plan    PT Assessment Patient needs continued PT services  PT Problem List Decreased strength;Decreased range of motion;Decreased activity tolerance;Decreased balance;Decreased mobility;Decreased coordination;Decreased knowledge of use of DME;Decreased safety awareness;Decreased knowledge of precautions;Impaired tone       PT Treatment Interventions DME instruction;Gait training;Stair training;Functional mobility training;Therapeutic activities;Therapeutic exercise;Neuromuscular re-education;Patient/family education    PT Goals (Current goals can be found in the Care Plan section)  Acute Rehab PT Goals Patient Stated Goal: Watch TV - not able to participate with therapy driven goal setting PT Goal Formulation: Patient unable to participate in goal setting Time For Goal Achievement: 06/09/18 Potential to Achieve Goals: Good    Frequency Min 4X/week   Barriers to discharge        Co-evaluation               AM-PAC PT "6 Clicks" Daily Activity  Outcome Measure Difficulty turning over in bed (including adjusting bedclothes, sheets and blankets)?: Unable Difficulty moving from lying on back to sitting on the side of the bed? : Unable Difficulty sitting down on and standing up from a chair with arms (e.g.,  wheelchair, bedside commode, etc,.)?: Unable Help needed moving to and from a bed to chair  (including a wheelchair)?: A Little Help needed walking in hospital room?: A Little Help needed climbing 3-5 steps with a railing? : Total 6 Click Score: 10    End of Session Equipment Utilized During Treatment: Gait belt Activity Tolerance: Patient tolerated treatment well Patient left: in chair;with call bell/phone within reach;with chair alarm set Nurse Communication: Mobility status PT Visit Diagnosis: Unsteadiness on feet (R26.81);Hemiplegia and hemiparesis Hemiplegia - Right/Left: Left Hemiplegia - dominant/non-dominant: Non-dominant Hemiplegia - caused by: Cerebral infarction    Time: 1030-1106 PT Time Calculation (min) (ACUTE ONLY): 36 min   Charges:   PT Evaluation $PT Eval Moderate Complexity: 1 Mod PT Treatments $Gait Training: 8-22 mins   PT G Codes:        Conni Slipper, PT, DPT Acute Rehabilitation Services Pager: [phone]   Marylynn Pearson 05/26/2018, 12:06 PM

## 2018-05-26 NOTE — Anesthesia Postprocedure Evaluation (Signed)
Anesthesia Post Note  Patient: Cory Harvey  Procedure(s) Performed: RADIOLOGY WITH ANESTHESIA (N/A )     Patient location during evaluation: ICU Anesthesia Type: General Level of consciousness: awake Pain management: pain level controlled Vital Signs Assessment: post-procedure vital signs reviewed and stable Respiratory status: spontaneous breathing Cardiovascular status: stable Postop Assessment: no apparent nausea or vomiting Anesthetic complications: no    Last Vitals:  Vitals:   05/26/18 0700 05/26/18 0800  BP: 100/78   Pulse: (!) 58   Resp: (!) 24   Temp:  36.7 C  SpO2: 98%     Last Pain:  Vitals:   05/26/18 0800  TempSrc: Axillary  PainSc:                  Ayub Kirsh

## 2018-05-26 NOTE — Anesthesia Preprocedure Evaluation (Signed)
Anesthesia Evaluation  Patient identified by MRN, date of birth, ID band Patient confused    Reviewed: Allergy & Precautions, NPO status , Patient's Chart, lab work & pertinent test resultsPreop documentation limited or incomplete due to emergent nature of procedure.  Airway Mallampati: II  TM Distance: >3 FB     Dental   Pulmonary Current Smoker,    breath sounds clear to auscultation       Cardiovascular hypertension,  Rhythm:Regular Rate:Normal     Neuro/Psych    GI/Hepatic   Endo/Other    Renal/GU      Musculoskeletal   Abdominal   Peds  Hematology   Anesthesia Other Findings   Reproductive/Obstetrics                             Anesthesia Physical Anesthesia Plan  ASA: III  Anesthesia Plan: General   Post-op Pain Management:    Induction: Intravenous, Rapid sequence and Cricoid pressure planned  PONV Risk Score and Plan: Treatment may vary due to age or medical condition  Airway Management Planned: Oral ETT  Additional Equipment:   Intra-op Plan:   Post-operative Plan: Possible Post-op intubation/ventilation  Informed Consent: I have reviewed the patients History and Physical, chart, labs and discussed the procedure including the risks, benefits and alternatives for the proposed anesthesia with the patient or authorized representative who has indicated his/her understanding and acceptance.   Dental advisory given  Plan Discussed with: Anesthesiologist and CRNA  Anesthesia Plan Comments:         Anesthesia Quick Evaluation

## 2018-05-26 NOTE — Progress Notes (Signed)
Rehab Admissions Coordinator Note:  Patient was screened by Clois DupesBoyette, [facility] for appropriateness for an Inpatient Acute Rehab Consult. Per PT recommendation.  At this time, we are recommending Inpatient Rehab consult.  Ottie GlazierBarbara Janeth Terry, RN, MSN Rehab Admissions Coordinator [phone] 05/26/2018 3:06 PM

## 2018-05-26 NOTE — Evaluation (Addendum)
Speech Language Pathology Evaluation Patient Details Name: Cory Harvey MRN: [id number] DOB: 1961-06-29 Today's Date: 05/26/2018 Time: 1010-1030 SLP Time Calculation (min) (ACUTE ONLY): 20 min  Problem List:  Patient Active Problem List   Diagnosis Date Noted  . Stroke (cerebrum) (HCC) 05/25/2018  . Middle cerebral artery stenosis, right 05/25/2018  . Cerebrovascular accident (CVA) involving right cerebral hemisphere (HCC) 03/29/2018  . CVA (cerebral vascular accident) (HCC) 03/26/2018  . Acute CVA (cerebrovascular accident) (HCC) 09/02/2017  . Gait disturbance 09/02/2017  . History of CVA (cerebrovascular accident) 04/18/2017  . Tobacco use 04/18/2017  . Depression   . Hypertension   . Stroke [facility](HCC)    Past Medical History:  Past Medical History:  Diagnosis Date  . Depression   . History of CVA (cerebrovascular accident)   . Hypertension   . Stroke (HCC)    x 4  . Tobacco abuse    Past Surgical History:  Past Surgical History:  Procedure Laterality Date  . kidney surgery Left 1983  . TEE WITHOUT CARDIOVERSION N/A 03/27/2018   Procedure: TRANSESOPHAGEAL ECHOCARDIOGRAM (TEE);  Surgeon: Dalia HeadingFath, Kenneth A, MD;  Location: [facility] ORS;  Service: Cardiovascular;  Laterality: N/A;   HPI:  Cory Harvey is a 57 y.o. male who states that he started feeling abnormal around 4:30 pm. He was still doing his activities and moving well until around 8:45 pm on 6/14, however. At that time, he developed left sided weakness and EMS was called. A code stroke was activated and he arrived aroun 01:15 on 6/15.  On arrival, the patient had an NIH of 16 with severe left hemiparesis, right-sided gaze, left hemianopia.  This did improve after CT perfusion scan to an NIH of 8, but he still had significant deficits and then he began to wax and wane.  Given the severity of his deficits and the fact that he had already failed aspirin/Plavix/atorvastatin for his intracranial stenosis, he was taken for  emergent intracranial angiography with consideration of intervention.  Patient is known to ST service from previous admission with stay on CIR.  MOCA-B given with a score of 16/30.    Assessment / Plan / Recommendation Clinical Impression    Cognitive/linguistic and motor speech screen was completed.   Patient with baseline cognitive deficits based upon evaluation completed in April of 2019 with a stay on CIR that was cut short because the patient wanted to go home.    The patient presented with a moderate dysarthria and moderate cognitive deficits.  Oral mechanism exam was completed and remarkable for decreased labial and facial ROM on the left and decreased jaw opening.  Lingual, labial and jaw strength were all reduced.  Voltional cough was weak with decreased cord closure suspected   Diadochokinetic rates were slow and imprecise.  Breath support was poor with decreased abiltiy for prolonged production of the /s, z/ sounds (ie 4-5 seconds max).  Intelligibility was poor and patient was very hard to understand.  Context improved intelligibility.      He achieved a score of 12/27 on the Mini Mental State Exam (ie reading/writing/copying were not adminstered due to decreased vision).  He was oriented to person, place (ie city and hospital) and situation.  He was disoriented to time knowing only the month.  Immediate recall of 3 novel words was good but he was unable to recall them given a brief delay.  Use of semantic cue facilitated recall of the 3 novel words.  Attention to task was poor and the  patient was easily distracted.  He was also observed to be labile and would laugh inappropriately during evaluation.  His language skills seemed functional for at least basic needs.  He was able to name objects, repeat a short phrase, identify objects, follow 2 step directions and perform automatic speech tasks (ie 1-10, days of week and months of year).  He struggled to follow more complex directions, however,  question role memory deficits were playing in this.  He required cues to provide logical solutions to simple problems.  Question his insight and judgement into his current deficits.  When asked if you could go home safely he was not sure if that was possible.    Given results of this evaluation the patient will require intense ST at the next level of care to address deficits.  He will require 24x7 supervision at least intimally after discharge.  ST will follow during acute stay.    ETA:  Patient also noted to be dysfluent.      SLP Assessment  SLP Recommendation/Assessment: Patient needs continued Speech Lanaguage Pathology Services SLP Visit Diagnosis: Dysarthria and anarthria (R47.1)    Follow Up Recommendations  Skilled Nursing facility vs Inpatient Rehab    Frequency and Duration min 2x/week  2 weeks      SLP Evaluation Cognition  Overall Cognitive Status: Impaired/Different from baseline Arousal/Alertness: Awake/alert Orientation Level: Oriented to person;Oriented to place;Oriented to situation;Disoriented to time Attention: Sustained Sustained Attention: Impaired Sustained Attention Impairment: Verbal basic;Functional basic Memory: Impaired Memory Impairment: Decreased recall of new information;Decreased short term memory Decreased Short Term Memory: Verbal basic;Functional basic Awareness: Impaired Awareness Impairment: Intellectual impairment Problem Solving: Impaired Problem Solving Impairment: Verbal basic;Functional basic Safety/Judgment: Impaired       Comprehension  Auditory Comprehension Overall Auditory Comprehension: Impaired Commands: Impaired Two Step Basic Commands: 75-100% accurate Complex Commands: 50-74% accurate Conversation: Simple Interfering Components: Attention Reading Comprehension Reading Status: Not tested    Expression Expression Primary Mode of Expression: Verbal Verbal Expression Overall Verbal Expression: Impaired Initiation:  Impaired Automatic Speech: Name;Social Response;Counting;Day of week;Month of year Level of Generative/Spontaneous Verbalization: Phrase;Sentence Repetition: No impairment Naming: No impairment Pragmatics: No impairment Interfering Components: Attention Non-Verbal Means of Communication: Not applicable Written Expression Written Expression: Not tested   Oral / Motor  Oral Motor/Sensory Function Overall Oral Motor/Sensory Function: Moderate impairment Facial ROM: Reduced left Facial Symmetry: Abnormal symmetry left;Suspected CN VII (facial) dysfunction Facial Strength: Reduced left;Suspected CN VII (facial) dysfunction Lingual ROM: Within Functional Limits Lingual Symmetry: Abnormal symmetry left;Suspected CN XII (hypoglossal) dysfunction Lingual Strength: Reduced;Suspected CN XII (hypoglossal) dysfunction Mandible: Impaired;Suspected CN V (Trigeminal) dysfunction Motor Speech Overall Motor Speech: Impaired Respiration: Impaired Level of Impairment: Word Phonation: Low vocal intensity Resonance: Within functional limits Articulation: Impaired Level of Impairment: Word Intelligibility: Intelligibility reduced Word: 50-74% accurate Phrase: 25-49% accurate Sentence: 25-49% accurate Conversation: 25-49% accurate Motor Planning: Witnin functional limits Motor Speech Errors: Not applicable   GO                   Dimas Aguas, MA, CCC-SLP Acute Rehab SLP [phone] Fleet Contras 05/26/2018, 11:02 AM

## 2018-05-26 NOTE — Progress Notes (Signed)
STROKE TEAM PROGRESS NOTE   HISTORY OF PRESENT ILLNESS (per record) Cory Harvey is a 57 y.o. male who states that he started feeling abnormal around 4:30 pm. He was still doing his activities and moving well until around 8:45 pm on 6/14, however. At that time, he developed left sided weakness and EMS was called. A code stroke was activated and he arrived around 01:15 on 6/15.  On arrival, the patient had an NIH of 16 with severe left hemiparesis, right-sided gaze, left hemianopia.  This did improve after CT perfusion scan to an NIH of 8, but he still had significant deficits and then he began to wax and wane. Given the severity of his deficits and the fact that he had already failed aspirin/Plavix/atorvastatin for his intracranial stenosis, he was taken for emergent intracranial angiography with consideration of intervention.  LKW: 4:30 pm tpa given?: no, out of window Premorbid modified rankin scale: 1  IR Cerebral Angiogram and Intervention - Dr Corliss Skainseveshwar S/P rt common carotid arteriogram RT CFA approach. FIndings  1.Severe irregular stenosis of RT MCA inf division mid  M2 branch,and sig irregular stenosis of RT MCA mid M1  due to long seg  Irregular ICAD  plaque ,S/P primary stenting with achievement of TICI 3 reperfusion.     SUBJECTIVE (INTERVAL HISTORY) The patient is much more cooperative with evaluation.  He is not irritated at this time.  He reports that he is not feeling well but could not give specific complaints.  He is however complaining about his severe dysarthria.   OBJECTIVE Temp:  [98.1 F (36.7 C)-98.3 F (36.8 C)] 98.1 F (36.7 C) (06/16 0800) Pulse Rate:  [54-91] 90 (06/16 1000) Cardiac Rhythm: Normal sinus rhythm (06/16 0800) Resp:  [9-36] 17 (06/16 1000) BP: (80-121)/(61-96) 117/75 (06/16 1000) SpO2:  [92 %-100 %] 96 % (06/16 1000) Arterial Line BP: (91-143)/(48-72) 127/69 (06/16 0615)  CBC:  Recent Labs  Lab 05/25/18 0124 05/25/18 0140  05/26/18 0600  WBC 8.8  --  8.6  NEUTROABS 6.6  --  6.4  HGB 15.9 17.3* 14.0  HCT 49.9 51.0 43.2  MCV 85.6  --  83.7  PLT 397  --  362    Basic Metabolic Panel:  Recent Labs  Lab 05/25/18 0124 05/25/18 0140 05/26/18 0600  NA 140 141 139  K 3.8 3.6 3.5  CL 108 105 110  CO2 22  --  23  GLUCOSE 90 87 79  BUN 10 11 <5*  CREATININE 1.25* 1.20 1.03  CALCIUM 9.3  --  8.3*    Lipid Panel:     Component Value Date/Time   CHOL 125 05/25/2018 0915   CHOL 150 09/21/2014 0408   TRIG 54 05/25/2018 0915   TRIG 40 09/21/2014 0408   HDL 52 05/25/2018 0915   HDL 76 (H) 09/21/2014 0408   CHOLHDL 2.4 05/25/2018 0915   VLDL 11 05/25/2018 0915   VLDL 8 09/21/2014 0408   LDLCALC 62 05/25/2018 0915   LDLCALC 66 09/21/2014 0408   HgbA1c:  Lab Results  Component Value Date   HGBA1C 5.6 05/25/2018   Urine Drug Screen:     Component Value Date/Time   LABOPIA NONE DETECTED 05/25/2018 0600   COCAINSCRNUR POSITIVE (A) 05/25/2018 0600   LABBENZ NONE DETECTED 05/25/2018 0600   AMPHETMU NONE DETECTED 05/25/2018 0600   THCU NONE DETECTED 05/25/2018 0600   LABBARB (A) 05/25/2018 0600    Result not available. Reagent lot number recalled by manufacturer.    Alcohol  Level     Component Value Date/Time   ETH <10 05/25/2018 0124    IMAGING  Ct Angio Head and Neck W Or Wo Contrast 05/25/2018  CTA NECK:  1. No hemodynamically significant stenosis carotid arteries. Patent vertebral arteries.   CTA HEAD:  1. Mild segment RIGHT M1 severe stenosis, given prior image this suggest pre-existing stenosis propagation, possible acute component.  2. Occluded distal azygos ACA with poor collateralization.  3. Tandem severe stenosis versus focally occluded LEFT posterior cerebral artery.  Occluded RIGHT P3 segment.   CT PERFUSION:  1. Small RIGHT parietal lobe infarct with confluent RIGHT frontoparietal/PCA and posterior watershed penumbra. Aortic Atherosclerosis (ICD10-I70.0).    Ct Head  Code Stroke Wo Contrast 05/25/2018 IMPRESSION:  1. No acute intracranial process.  2. ASPECTS is 10.  3. Old small pontine, basal ganglia and thalamus infarcts. Moderate to severe chronic small vessel ischemic changes.  4. Old LEFT insular/parietal lobe, MCA territory infarct.  5. Moderate parenchymal brain volume loss.    IR Cerebral Angiogram and Intervention - Dr Marcell Anger 05/25/2018 S/P rt common carotid arteriogram RT CFA approach. FIndings  1.Severe irregular stenosis of RT MCA inf division mid  M2 branch,and sig irregular stenosis of RT MCA mid M1  due to long seg  Irregular ICAD  plaque ,S/P primary stenting with achievement of TICI 3 reperfusion.    [name]  05/25/2018 MPRESSION: 1. Scattered small foci of acute/early subacute infarction in right posterior frontal lobe, right superior/medial parietal lobe, right posterior cingulate gyrus, and right splenium of corpus callosum. 2. Additional punctate acute/early subacute infarctions in the right posterior temporal lobe, left superior cerebellum, left posterior limb of internal capsule. 3. No hemorrhage or significant mass effect. 4. Stable background of chronic infarctions, advanced chronic microvascular ischemic changes, and parenchymal volume loss of the brain.   TEE [facility]) 03/27/2018 Study Conclusions - Left ventricle: Systolic function was normal. The estimated   ejection fraction was in the range of 50% to 55%. - Aortic valve: No evidence of vegetation. - Mitral valve: No evidence of vegetation. - Left atrium: No evidence of thrombus in the atrial cavity or   appendage. - Right atrium: No evidence of thrombus in the atrial cavity or   appendage. - Atrial septum: No defect or patent foramen ovale was identified.   Echo contrast study showed no right-to-left atrial level shunt,   at baseline or with provocation. Impressions: - No cardiac source of emboli was indentified.     PHYSICAL  EXAM Vitals:   05/26/18 0700 05/26/18 0800 05/26/18 0900 05/26/18 1000  BP: 100/78 105/76 117/79 117/75  Pulse: (!) 58 (!) 58 70 90  Resp: (!) 24 14 20 17   Temp:  98.1 F (36.7 C)    TempSrc:  Axillary    SpO2: 98% 97% 98% 96%  Weight:      Height:        GENERAL: He is irritated and try to get out of bed.  HEENT: Normal  ABDOMEN: soft  EXTREMITIES: No edema   BACK: Normal  SKIN: Normal by inspection.    MENTAL STATUS: He is awake and alert.  He has a rather severe dysarthria.  He is somewhat confused at time but knows that he is in the hospital in [place] at the [facility].  CRANIAL NERVES: Pupils are equal, round and reactive to light and accomodation; extra ocular movements are full, there is no significant nystagmus; visual fields are full; upper and lower facial muscles are  normal in strength and symmetric, there is no flattening of the nasolabial folds; tongue is midline; uvula is midline; shoulder elevation is normal.  MOTOR: Fluctuating examination but most consistently the right leg is 4/5 and the right upper extremity 4/5.  Left leg 5 and left upper extremity 2/5.   COORDINATION: No clear dysmetria or tremors noted or ataxia.     ASSESSMENT/PLAN Cory Harvey is a 57 y.o. male with history of previous stroke, tobacco use, hypertension, and depression presenting with left-sided weakness. He did not receive IV t-PA due to late presentation. IR - Irregular Rt ICAD  plaque ,S/P primary stenting  Stroke:  MRI - multiple bilateral infarcts - embolic uncertain etiology - likely carotid - consider loop?  Resultant   Severe and left hemiparesis.  CT head - No acute intracranial process. - multiple old infarcts  MRI head - multiple bilateral infarcts  MRA head - not performed  CTA H&N - RIGHT M1 severe stenosis. Occluded distal azygos ACA. Occluded RIGHT P3 segment.   Tandem severe stenosis versus focally occluded LEFT posterior cerebral artery.  Carotid  Doppler - CTA neck  TEE - 03/27/2018 - EF 50 - 55%. No cardiac source of emboli identified.  LDL - 62  HgbA1c - 5.6  VTE prophylaxis - SCDs Diet Order           Diet regular Room service appropriate? Yes; Fluid consistency: Thin  Diet effective now          aspirin 325 mg daily and clopidogrel 75 mg daily prior to admission, now on aspirin 81 mg daily and Brilinta  Patient counseled to be compliant with his antithrombotic medications  Ongoing aggressive stroke risk factor management  Therapy recommendations:  pending  Disposition:  Pending  Hypertension  Stable - BP tends to run low . Permissive hypertension (OK if < 220/120) but gradually normalize in 5-7 days . Long-term BP goal normotensive  Hyperlipidemia  Lipid lowering medication PTA:  Lipitor 10 mg daily  LDL 62, goal < 70  Current lipid lowering medication: none -> resume Lipitor 10 mg daily  Continue statin at discharge  Other Stroke Risk Factors  Cigarette smoker - advised to stop smoking  ETOH use, advised to drink no more than 1 alcoholic beverage per day.\  Hx stroke/TIA  Family hx stroke (mother)   Other Active Problems  UDS - positive for cocaine  S/P Rt carotid stent - 05/25/2018     Hospital day # 1   He will be evaluated by physical and occupational therapist today.  Likely be able to transfer out of the unit tomorrow.  Continue to encourage cessation of nicotine and illicit drug use. Dr. Gerilyn Pilgrim -- The patient was seen and examined by me; notes, chart and tests reviewed and discussed with midlevel provider, other providers, patient, and family.      To contact Stroke Continuity provider, please refer to [email or web address]. After hours, contact General Neurology

## 2018-05-26 NOTE — Evaluation (Addendum)
Clinical/Bedside Swallow Evaluation Patient Details  Name: Cory Harvey MRN: [id number] Date of Birth: 09-30-1961  Today's Date: 05/26/2018 Time: SLP Start Time (ACUTE ONLY): 0955 SLP Stop Time (ACUTE ONLY): 1010 SLP Time Calculation (min) (ACUTE ONLY): 15 min  Past Medical History:  Past Medical History:  Diagnosis Date  . Depression   . History of CVA (cerebrovascular accident)   . Hypertension   . Stroke (HCC)    x 4  . Tobacco abuse    Past Surgical History:  Past Surgical History:  Procedure Laterality Date  . kidney surgery Left 1983  . TEE WITHOUT CARDIOVERSION N/A 03/27/2018   Procedure: TRANSESOPHAGEAL ECHOCARDIOGRAM (TEE);  Surgeon: Dalia HeadingFath, Kenneth A, MD;  Location: [facility] ORS;  Service: Cardiovascular;  Laterality: N/A;   HPI:  Cory Harvey is a 57 y.o. male who states that he started feeling abnormal around 4:30 pm. He was still doing his activities and moving well until around 8:45 pm on 6/14, however. At that time, he developed left sided weakness and EMS was called. A code stroke was activated and he arrived aroun 01:15 on 6/15.  On arrival, the patient had an NIH of 16 with severe left hemiparesis, right-sided gaze, left hemianopia.  This did improve after CT perfusion scan to an NIH of 8, but he still had significant deficits and then he began to wax and wane.  Given the severity of his deficits and the fact that he had already failed aspirin/Plavix/atorvastatin for his intracranial stenosis, he was taken for emergent intracranial angiography with consideration of intervention.  Patient is known to ST service from previous admission with stay on CIR.  MOCA-B given with a score of 16/30.    Assessment / Plan / Recommendation Clinical Impression  Clinical swallowing evaluation was completed using thin liquids, pureed material and dry solids.  Per NA who fed the patient this AM he was struggling to feed himself and had issues with pocketing food.  The patient  endorses issues with food pocketing in his left cheek.  Oral mechanism exam was completed and remarkable to decreased labial and facial ROM on the left and decreased jaw opening.  Lingual, labial and jaw strength were all reduced.  Voltional cough was weak with decreased cord closure suspected.   The patient presented with a mild oral dysphagia characterized by mild delay in oral transit with solids.  He utilized a liquid wash to completely clear oral cavity.  Oral residue/pocketing was not seen.  Patient was encouraged to pay attention to the possibility of pocketing by performing a lingual sweep.  Swallow trigger appeared to be timely and hyo-laryngeal movement was appreciated to palpation.  Extremely delayed cough response was seen well after completion of PO's.  Recommend that the patient continue on a regular diet with thin liquids.  Aspiration precautions were posted at the head of bed.   ST will follow for therapeutic diet tolerance and patient education.   SLP Visit Diagnosis: Dysphagia, oral phase (R13.11)    Aspiration Risk  Mild aspiration risk    Diet Recommendation   Regular with thin liquids  Medication Administration: Whole meds with liquid    Other  Recommendations Oral Care Recommendations: Oral care BID   Follow up Recommendations Skilled Nursing facility;Inpatient Rehab      Frequency and Duration min 2x/week  2 weeks           Swallow Study   General Date of Onset: 05/25/18 HPI: Cory Harvey is a 57 y.o. male  who states that he started feeling abnormal around 4:30 pm. He was still doing his activities and moving well until around 8:45 pm on 6/14, however. At that time, he developed left sided weakness and EMS was called. A code stroke was activated and he arrived aroun 01:15 on 6/15.  On arrival, the patient had an NIH of 16 with severe left hemiparesis, right-sided gaze, left hemianopia.  This did improve after CT perfusion scan to an NIH of 8, but he still had  significant deficits and then he began to wax and wane.  Given the severity of his deficits and the fact that he had already failed aspirin/Plavix/atorvastatin for his intracranial stenosis, he was taken for emergent intracranial angiography with consideration of intervention.  Patient is known to ST service from previous admission with stay on CIR.  MOCA-B given with a score of 16/30.  Type of Study: Bedside Swallow Evaluation Previous Swallow Assessment: None noted at [facility]. Diet Prior to this Study: Regular;Thin liquids Temperature Spikes Noted: No Respiratory Status: Room air History of Recent Intubation: No Behavior/Cognition: Alert;Cooperative;Requires cueing Oral Cavity Assessment: Within Functional Limits Oral Care Completed by SLP: No Oral Cavity - Dentition: Missing dentition Vision: Impaired for self-feeding Self-Feeding Abilities: Needs assist Patient Positioning: Upright in bed Baseline Vocal Quality: Low vocal intensity Volitional Cough: Weak Volitional Swallow: Able to elicit    Oral/Motor/Sensory Function Overall Oral Motor/Sensory Function: Moderate impairment Facial ROM: Reduced left Facial Symmetry: Abnormal symmetry left;Suspected CN VII (facial) dysfunction Facial Strength: Reduced left;Suspected CN VII (facial) dysfunction Lingual ROM: Within Functional Limits Lingual Symmetry: Abnormal symmetry left;Suspected CN XII (hypoglossal) dysfunction Lingual Strength: Reduced;Suspected CN XII (hypoglossal) dysfunction Mandible: Impaired;Suspected CN V (Trigeminal) dysfunction   Ice Chips Ice chips: Not tested   Thin Liquid Thin Liquid: Within functional limits Presentation: Cup;Spoon;Straw;Self Fed    Nectar Thick Nectar Thick Liquid: Not tested   Honey Thick Honey Thick Liquid: Not tested   Puree Puree: Within functional limits Presentation: Spoon   Solid   GO   Solid: Within functional limits Presentation: Self Fed       Dimas Aguas, MA, CCC-SLP Acute  Rehab SLP [phone] Fleet Contras 05/26/2018,10:45 AM

## 2018-05-27 ENCOUNTER — Encounter (HOSPITAL_COMMUNITY): Payer: Self-pay | Admitting: Interventional Radiology

## 2018-05-27 DIAGNOSIS — G8194 Hemiplegia, unspecified affecting left nondominant side: Secondary | ICD-10-CM

## 2018-05-27 DIAGNOSIS — I63511 Cerebral infarction due to unspecified occlusion or stenosis of right middle cerebral artery: Secondary | ICD-10-CM

## 2018-05-27 DIAGNOSIS — I6601 Occlusion and stenosis of right middle cerebral artery: Secondary | ICD-10-CM

## 2018-05-27 LAB — POCT ACTIVATED CLOTTING TIME: Activated Clotting Time: 136 seconds

## 2018-05-27 NOTE — Evaluation (Signed)
Occupational Therapy Evaluation Patient Details Name: Cory Harvey MRN: [id number] DOB: [birth year] Today's Date: 05/27/2018    History of Present Illness Cory Harvey is a 57 y.o. 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  He lives alone.  He  Presented to hospital 05/25/2018 with increasing left-sided weakness as well as right-sided gaze.  Urine drug screen positive cocaine.  Cranial CT scan showed no acute process.  Patient did not receive TPA..   CT perfusion scan showed small right parietal lobe infarct with confluent right frontoparietal/PCA and posterior watershed penumbra.  Patient continued to wax and wane.  Interventional radiology cerebral angiogram and underwent stenting right MCA inferior division M2 branch.      Clinical Impression   Pt admitted with above. He demonstrates the below listed deficits and will benefit from continued OT to maximize safety and independence with BADLs.  Pt presents to OT with Lt hemiparesis, Lt shoulder pain, Lt neglect, impaired cognition, decreased balance.  He currently requires mod A, overall with ADLs.   He lived alone PTA, and was independent with ADLs - his mother provided transportation.   Recommend CIR.  Due to the severity of his Lt neglect, coupled with impaired cognition and impaired balance, he would greatly benefit from the structure and consistency of CIR, as well as therapists specially trained in CVA and team approach.  Pt shared that he has tried to stop using cocaine, but has been unsuccessful, and feels he needs drug rehab - will benefit from SW involvement.   Would also benefit from referral to services for the blind to aid in vision exam and acquiri new glasses       Follow Up Recommendations  CIR;Supervision/Assistance - 24 hour    Equipment Recommendations  None  recommended by OT    Recommendations for Other Services Rehab consult     Precautions / Restrictions Precautions Precautions: Fall Precaution Comments: Lt neglect       Mobility Bed Mobility                  Transfers Overall transfer level: Needs assistance Equipment used: 1 person hand held assist Transfers: Sit to/from Stand Sit to Stand: Min assist         General transfer comment: assist for balance     Balance Overall balance assessment: Needs assistance Sitting-balance support: Feet supported Sitting balance-Leahy Scale: Fair     Standing balance support: No upper extremity supported;During functional activity Standing balance-Leahy Scale: Fair Standing balance comment: Pt able to maintain static standing with close min guard assist                            ADL either performed or assessed with clinical judgement   ADL Overall ADL's : Needs assistance/impaired Eating/Feeding: Minimal assistance;Sitting Eating/Feeding Details (indicate cue type and reason): cues and assist to locate all items Grooming: Wash/dry hands;Wash/dry face;Oral care;Minimal assistance;Standing   Upper Body Bathing: Moderate assistance;Sitting   Lower Body Bathing: Moderate assistance;Sit to/from stand   Upper Body Dressing : Moderate assistance;Sitting   Lower Body Dressing: Maximal assistance;Sit to/from stand Lower Body Dressing Details (indicate cue type and reason): Pt able to pull socks over feet, once started over his toes  Toilet Transfer: Minimal assistance;Stand-pivot;BSC   Toileting- Clothing Manipulation and Hygiene: Maximal assistance;Sit to/from stand  Functional mobility during ADLs: Minimal assistance;Moderate assistance       Vision Baseline Vision/History: Wears glasses Wears Glasses: At all times Patient Visual Report: No change from baseline Vision Assessment?: Yes Ocular Range of Motion: Within Functional  Limits Tracking/Visual Pursuits: Impaired - to be further tested in functional context Visual Fields: Left visual field deficit Additional Comments: Pt wearing glasses which have obvious smudges which likely reduce his ability to see.  When attempting to clean them found that smudges are superglue and that lenses are sitting precariously in his frames.  Pt able to locate stimuli in both visual fields except Lt superior quadrant.  He does demonstrate Lt neglect      Perception Perception Perception Tested?: Yes Perception Deficits: Inattention/neglect Inattention/Neglect: Does not attend to left side of body;Does not attend to left visual field   Praxis Praxis Praxis tested?: Deficits Deficits: Ideomotor    Pertinent Vitals/Pain Pain Assessment: Faces Faces Pain Scale: Hurts little more Pain Location: Lt shoulder with attempts at reaching      Hand Dominance Left   Extremity/Trunk Assessment Upper Extremity Assessment Upper Extremity Assessment: LUE deficits/detail LUE Deficits / Details: Pt demonstrates movement beginning Brunnstrom stage 4 - moving out of synergies.  He demonstrates mass flexion and extension of hand.  He  Indicates shoulder pain when attempting to reach with Lt UE - grimaces.  He demonstrates full PROM with facilitation of upward rotation of the scapula LUE Coordination: decreased fine motor;decreased gross motor   Lower Extremity Assessment Lower Extremity Assessment: Defer to PT evaluation       Communication Communication Communication: Expressive difficulties(dysarthric )   Cognition Arousal/Alertness: Awake/alert Behavior During Therapy: Flat affect(labile at times ) Overall Cognitive Status: Impaired/Different from baseline Area of Impairment: Orientation;Attention;Following commands;Problem solving;Awareness;Safety/judgement                 Orientation Level: Disoriented to;Time Current Attention Level: Selective Memory: Decreased short-term  memory Following Commands: Follows one step commands consistently Safety/Judgement: Decreased awareness of deficits;Decreased awareness of safety Awareness: Intellectual Problem Solving: Slow processing;Difficulty sequencing;Requires verbal cues;Requires tactile cues General Comments: 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  He states he has been using x 1 year, and was introduced to it by former friends.  He states he has tried to stop, but has been unable to do so.  He is receptive to drug treatement.  He was tearful throughout session.     Exercises     Shoulder Instructions      Home Living Family/patient expects to be discharged to:: Private residence Living Arrangements: Alone Available Help at Discharge: Available PRN/intermittently Type of Home: House Home Access: Stairs to enter [employer] of Steps: 6 Entrance Stairs-Rails: Can reach both Home Layout: One level     Bathroom Shower/Tub: Chief Strategy Officer: Standard     Home Equipment: None      Lives With: Alone    Prior Functioning/Environment Level of Independence: Independent        Comments: Pt reports he was not working.  He does not drive.  His mother provides transportation.  He enjoys cooking and cleaning         OT Problem List: Decreased strength;Decreased range of motion;Decreased activity tolerance;Impaired balance (sitting and/or standing);Impaired vision/perception;Decreased coordination;Decreased cognition;Decreased safety awareness;Impaired sensation;Impaired UE functional use;Pain      OT Treatment/Interventions: Self-care/ADL training;Neuromuscular education;DME and/or AE instruction;Therapeutic activities;Cognitive remediation/compensation;Visual/perceptual remediation/compensation;Patient/family education;Balance training  OT Goals(Current goals can be found in the care plan section) Acute  Rehab OT Goals Patient Stated Goal: to be able to cook supper and clean my house  OT Goal Formulation: With patient Time For Goal Achievement: 06/10/18 Potential to Achieve Goals: Good ADL Goals Pt Will Perform Grooming: with supervision;standing Pt Will Perform Upper Body Bathing: with supervision;sitting Pt Will Perform Lower Body Bathing: with supervision;sit to/from stand Pt Will Perform Upper Body Dressing: with supervision;sitting Pt Will Perform Lower Body Dressing: with supervision;sit to/from stand Pt Will Transfer to Toilet: with supervision;ambulating;regular height toilet;grab bars Additional ADL Goal #1: Pt will locate items on Lt side during ADLs with min cues  OT Frequency: Min 2X/week   Barriers to D/C:            Co-evaluation              AM-PAC PT "6 Clicks" Daily Activity     Outcome Measure Help from another person eating meals?: A Little Help from another person taking care of personal grooming?: A Little Help from another person toileting, which includes using toliet, bedpan, or urinal?: A Lot Help from another person bathing (including washing, rinsing, drying)?: A Lot Help from another person to put on and taking off regular upper body clothing?: A Lot Help from another person to put on and taking off regular lower body clothing?: A Lot 6 Click Score: 14   End of Session Nurse Communication: Mobility status  Activity Tolerance: Patient tolerated treatment well Patient left: in chair;with call bell/phone within reach;with chair alarm set  OT Visit Diagnosis: Unsteadiness on feet (R26.81);Low vision, both eyes (H54.2);Cognitive communication deficit (R41.841);Hemiplegia and hemiparesis Symptoms and signs involving cognitive functions: Cerebral infarction Hemiplegia - Right/Left: Left Hemiplegia - dominant/non-dominant: Dominant Hemiplegia - caused by: Cerebral infarction                Time: [id number] OT Time Calculation (min): 21 min Charges:   OT General Charges $OT Visit: 1 Visit OT Evaluation $OT Eval Moderate Complexity: 1 Mod G-Codes:     Reynolds AmericanWendi Khya Halls, OTR/L [phone]   Jeani HawkingConarpe, Rache Klimaszewski M 05/27/2018, 6:09 PM

## 2018-05-27 NOTE — Consult Note (Signed)
Physical Medicine and Rehabilitation Consult Reason for Consult: Left-sided weakness Referring Physician: Dr. Pearlean Brownie   HPI: Cory Harvey is a 57 y.o. 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  He was discharged to home at a supervision level.  He lives alone.  One level home with 6 steps to entry.  Family checks on him as needed.  Presented 05/25/2018 with increasing left-sided weakness as well as right-sided gaze.  Urine drug screen positive cocaine.  Cranial CT scan showed no acute process.  Patient did not receive TPA.  CT angiogram of head and neck with no significant stenosis carotid arteries.  Mild segment right M1 severe stenosis.  CT perfusion scan showed small right parietal lobe infarct with confluent right frontoparietal/PCA and posterior watershed penumbra.  Patient continued to wax and wane.  Interventional radiology cerebral angiogram and underwent stenting right MCA inferior division M2 branch.  Currently maintained on aspirin with the addition of Brilinta for CVA prophylaxis.  Tolerating a regular diet.  Physical and occupational therapy evaluations completed with recommendations of physical medicine rehab consult.   Review of Systems  Constitutional: Negative for chills and fever.  HENT: Negative for hearing loss.   Eyes: Positive for blurred vision. Negative for double vision.  Respiratory: Negative for cough and shortness of breath.   Cardiovascular: Negative for chest pain, palpitations and leg swelling.  Gastrointestinal: Positive for constipation. Negative for nausea and vomiting.  Genitourinary: Negative for dysuria, flank pain and hematuria.  Musculoskeletal: Positive for myalgias.  Skin: Negative for rash.  Neurological: Positive for speech change and focal weakness.  All  other systems reviewed and are negative.  Past Medical History:  Diagnosis Date  . Depression   . History of CVA (cerebrovascular accident)   . Hypertension   . Stroke (HCC)    x 4  . Tobacco abuse    Past Surgical History:  Procedure Laterality Date  . kidney surgery Left 1983  . TEE WITHOUT CARDIOVERSION N/A 03/27/2018   Procedure: TRANSESOPHAGEAL ECHOCARDIOGRAM (TEE);  Surgeon: Dalia Heading, MD;  Location: [facility] ORS;  Service: Cardiovascular;  Laterality: N/A;   Family History  Problem Relation Age of Onset  . Diabetes Mother   . Heart disease Mother   . Hyperlipidemia Mother   . Hypertension Mother   . Stroke Mother   . Diabetes Father   . Hypertension Father   . Cancer Neg Hx    Social History:  reports that he has been smoking cigarettes.  He has been smoking about 0.50 packs per day. He has never used smokeless tobacco. He reports that he drinks alcohol. He reports that he does not use drugs. Allergies: No Known Allergies Medications Prior to Admission  Medication Sig Dispense Refill  . aspirin 325 MG EC tablet Take 1 tablet (325 mg total) by mouth daily. 100 tablet 0  . atorvastatin (LIPITOR) 10 MG tablet Take 2 tablets (20 mg total) by mouth daily at 6 PM. 30 tablet 0  . clopidogrel (PLAVIX) 75 MG tablet Take 1 tablet (75 mg total) by mouth daily. 30 tablet 0  . gabapentin (NEURONTIN) 100 MG capsule Take 1 capsule (100 mg total) by mouth at bedtime. 30 capsule 0  . losartan (COZAAR) 25 MG tablet Take 1 tablet (25 mg total) by mouth daily. 30 tablet 0    Home: Home  Living Family/patient expects to be discharged to:: Private residence Living Arrangements: Alone Available Help at Discharge: Available PRN/intermittently Type of Home: House Home Access: Stairs to enter [employer] of Steps: 6 Entrance Stairs-Rails: Can reach both Home Layout: One level Bathroom Shower/Tub: Engineer, manufacturing systems: Standard Home Equipment: None  Lives With:  Alone  Functional History: Prior Function Level of Independence: Independent Comments: Pt reports he was not working, however was independent PTA Functional Status:  Mobility: Bed Mobility Overal bed mobility: Needs Assistance Bed Mobility: Supine to Sit Supine to sit: Mod assist General bed mobility comments: HOB flat. Pt was able to transition to EOB with assist for trunk elevation to full sitting position. Advanced LE's to edge and scooted hips well.  Transfers Overall transfer level: Needs assistance Equipment used: 2 person hand held assist Transfers: Sit to/from Stand Sit to Stand: Mod assist, +2 safety/equipment General transfer comment: Min assist to power-up to full standing position however mod assist provided for balance support and safety.  Ambulation/Gait Ambulation/Gait assistance: Mod assist, +2 safety/equipment Gait Distance (Feet): 150 Feet Assistive device: 1 person hand held assist Gait Pattern/deviations: Wide base of support, Decreased stride length, Step-through pattern, Shuffle General Gait Details: Noted externally rotated LLE and overall antalgic gait. Pt had difficulty coordinating smooth gait pattern. As distance progressed feel pt improved but mod assist required initially for balance and sequencing.   Gait velocity: Decreased Gait velocity interpretation: <1.8 ft/sec, indicate of risk for recurrent falls    ADL:    Cognition: Cognition Overall Cognitive Status: Impaired/Different from baseline Arousal/Alertness: Awake/alert Orientation Level: Oriented X4 Attention: Sustained Sustained Attention: Impaired Sustained Attention Impairment: Verbal basic, Functional basic Memory: Impaired Memory Impairment: Decreased recall of new information, Decreased short term memory Decreased Short Term Memory: Verbal basic, Functional basic Awareness: Impaired Awareness Impairment: Intellectual impairment Problem Solving: Impaired Problem Solving Impairment:  Verbal basic, Functional basic Safety/Judgment: Impaired Cognition Arousal/Alertness: Awake/alert(lethargic towards end of session with fatigue) Behavior During Therapy: Flat affect Overall Cognitive Status: Impaired/Different from baseline Area of Impairment: Orientation, Attention, Memory, Following commands, Safety/judgement, Awareness, Problem solving Orientation Level: Disoriented to, Place, Time, Situation Current Attention Level: Sustained Memory: Decreased short-term memory Following Commands: Follows one step commands inconsistently, Follows multi-step commands inconsistently Safety/Judgement: Decreased awareness of safety, Decreased awareness of deficits Awareness: Intellectual Problem Solving: Slow processing, Decreased initiation, Difficulty sequencing, Requires verbal cues, Requires tactile cues  Blood pressure 120/81, pulse 64, temperature 97.6 F (36.4 C), temperature source Axillary, resp. rate 18, height 5\' 10"  (1.778 m), weight 78.6 kg (173 lb 4.5 oz), SpO2 98 %. Physical Exam  Constitutional: He appears well-developed.  HENT:  Head: Normocephalic.  Eyes: Pupils are equal, round, and reactive to light.  Neck: Normal range of motion.  Cardiovascular: Normal rate.  Respiratory: Effort normal.  GI: Soft.  Musculoskeletal: He exhibits tenderness.  Neurological: He is alert.  Left facial weakness. Dysarthric speech, tongue deviated. LUE 2 to 2+/5. LLE 2- to 2/5. Sensory 1/2 LUE and LLE. RLE motor 4-5/5.   Skin: Skin is warm.  Psychiatric:  flat    No results found for this or any previous visit (from the past 24 hour(s)). Mr Brain 50 Contrast  Result Date: 05/25/2018 CLINICAL DATA:  57 y/o  M; stroke for follow-up. EXAM: MRI HEAD WITHOUT CONTRAST TECHNIQUE: Multiplanar, multiecho pulse sequences of the brain and surrounding structures were obtained without intravenous contrast. COMPARISON:  05/25/2018 CT head, CTA head, CT perfusion head. 03/26/2018 MRI head.  FINDINGS: Brain: Small scattered foci of  reduced diffusion are present throughout the right posterior frontal lobe, superior medial parietal lobe, posterior cingulate gyrus, and splenium of corpus callosum compatible with acute/early subacute infarction. Additionally there are punctate foci in the right posterior temporal lobe, left superior cerebellum, and left posterior limb of internal capsule. There are stable small chronic infarctions within the left cerebellum, mid pons, and lacunar infarcts scattered throughout the bilateral basal ganglia. Stable background of extensive chronic microvascular ischemic changes and moderate parenchymal volume loss of the brain. Vascular: Normal flow voids. Skull and upper cervical spine: Normal marrow signal. Sinuses/Orbits: Moderate diffuse paranasal sinus mucosal thickening with patchy opacification of the ethmoid sinuses. No abnormal signal of mastoid air cells. Orbits are unremarkable. Other: None. IMPRESSION: 1. Scattered small foci of acute/early subacute infarction in right posterior frontal lobe, right superior/medial parietal lobe, right posterior cingulate gyrus, and right splenium of corpus callosum. 2. Additional punctate acute/early subacute infarctions in the right posterior temporal lobe, left superior cerebellum, left posterior limb of internal capsule. 3. No hemorrhage or significant mass effect. 4. Stable background of chronic infarctions, advanced chronic microvascular ischemic changes, and parenchymal volume loss of the brain. These results will be called to the ordering clinician or representative by the Radiologist Assistant, and communication documented in the PACS or zVision Dashboard. Electronically Signed   By: Mitzi Hansen M.D.   On: 05/25/2018 19:32   Dg Chest Port 1 View  Result Date: 05/25/2018 CLINICAL DATA:  Stroke EXAM: PORTABLE CHEST 1 VIEW COMPARISON:  09/20/2014 FINDINGS: No acute airspace disease or pleural effusion. Stable  cardiomediastinal silhouette allowing for rotation. No pneumothorax. IMPRESSION: No active disease. Electronically Signed   By: Jasmine Pang M.D.   On: 05/25/2018 19:53     Assessment/Plan: Diagnosis: right MCA infarct with left hemiparesis 1. Does the need for close, 24 hr/day medical supervision in concert with the patient's rehab needs make it unreasonable for this patient to be served in a less intensive setting? Yes 2. Co-Morbidities requiring supervision/potential complications: cocaine abuse, hx of prior CVA, HTN, depression 3. Due to bladder management, bowel management, safety, skin/wound care, disease management, medication administration and patient education, does the patient require 24 hr/day rehab nursing? Yes 4. Does the patient require coordinated care of a physician, rehab nurse, PT (1-2 hrs/day, 5 days/week), OT (1-2 hrs/day, 5 days/week) and SLP (1-2 hrs/day, 5 days/week) to address physical and functional deficits in the context of the above medical diagnosis(es)? Yes Addressing deficits in the following areas: balance, endurance, locomotion, strength, transferring, bowel/bladder control, bathing, dressing, feeding, grooming, toileting, cognition and speech 5. Can the patient actively participate in an intensive therapy program of at least 3 hrs of therapy per day at least 5 days per week? Yes 6. The potential for patient to make measurable gains while on inpatient rehab is excellent 7. Anticipated functional outcomes upon discharge from inpatient rehab are supervision  with PT, supervision with OT, supervision with SLP. 8. Estimated rehab length of stay to reach the above functional goals is: 13-18 days 9. Anticipated D/C setting: Home 10. Anticipated post D/C treatments: HH therapy and Outpatient therapy 11. Overall Rehab/Functional Prognosis: excellent  RECOMMENDATIONS: This patient's condition is appropriate for continued rehabilitative care in the following setting:  CIR Patient has agreed to participate in recommended program. Yes Note that insurance prior authorization may be required for reimbursement for recommended care.  Comment: Rehab Admissions Coordinator to follow up.  Thanks,  Ranelle Oyster, MD, FAAPMR  I have personally performed a face to  face diagnostic evaluation of this patient. Additionally, I have reviewed and concur with the physician assistant's documentation above.     Mcarthur RossettiDaniel J Angiulli, PA-C 05/27/2018

## 2018-05-27 NOTE — Progress Notes (Signed)
Inpatient Rehabilitation Admissions Coordinator  I returned patient his fully charged cell phone. He was awake and eating dinner. Phone placed on his over bed table as he was eating.  Ottie GlazierBarbara Stanford Strauch, RN, MSN Rehab Admissions Coordinator [phone] 05/27/2018 6:07 PM

## 2018-05-27 NOTE — Progress Notes (Addendum)
STROKE TEAM PROGRESS NOTE  SUBJECTIVE (INTERVAL HISTORY) No family present. R groin soft, bandage CDI. No further bleeding per pt. Rehab consult underway.  OBJECTIVE Temp:  [97.6 F (36.4 C)-98.4 F (36.9 C)] 98.1 F (36.7 C) (06/17 0800) Pulse Rate:  [50-90] 51 (06/17 0800) Cardiac Rhythm: Normal sinus rhythm (06/17 0800) Resp:  [11-22] 16 (06/17 0800) BP: (109-141)/(67-99) 132/90 (06/17 0800) SpO2:  [96 %-100 %] 98 % (06/17 0800)  CBC:  Recent Labs  Lab 05/25/18 0124 05/25/18 0140 05/26/18 0600  WBC 8.8  --  8.6  NEUTROABS 6.6  --  6.4  HGB 15.9 17.3* 14.0  HCT 49.9 51.0 43.2  MCV 85.6  --  83.7  PLT 397  --  362    Basic Metabolic Panel:  Recent Labs  Lab 05/25/18 0124 05/25/18 0140 05/26/18 0600  NA 140 141 139  K 3.8 3.6 3.5  CL 108 105 110  CO2 22  --  23  GLUCOSE 90 87 79  BUN 10 11 <5*  CREATININE 1.25* 1.20 1.03  CALCIUM 9.3  --  8.3*    Lipid Panel:     Component Value Date/Time   CHOL 125 05/25/2018 0915   CHOL 150 09/21/2014 0408   TRIG 54 05/25/2018 0915   TRIG 40 09/21/2014 0408   HDL 52 05/25/2018 0915   HDL 76 (H) 09/21/2014 0408   CHOLHDL 2.4 05/25/2018 0915   VLDL 11 05/25/2018 0915   VLDL 8 09/21/2014 0408   LDLCALC 62 05/25/2018 0915   LDLCALC 66 09/21/2014 0408   HgbA1c:  Lab Results  Component Value Date   HGBA1C 5.6 05/25/2018   Urine Drug Screen:     Component Value Date/Time   LABOPIA NONE DETECTED 05/25/2018 0600   COCAINSCRNUR POSITIVE (A) 05/25/2018 0600   LABBENZ NONE DETECTED 05/25/2018 0600   AMPHETMU NONE DETECTED 05/25/2018 0600   THCU NONE DETECTED 05/25/2018 0600   LABBARB (A) 05/25/2018 0600    Result not available. Reagent lot number recalled by manufacturer.    Alcohol Level     Component Value Date/Time   ETH <10 05/25/2018 0124    IMAGING  Ct Head Code Stroke Wo Contrast 05/25/2018 1. No acute intracranial process.  2. ASPECTS is 10.  3. Old small pontine, basal ganglia and thalamus  infarcts. Moderate to severe chronic small vessel ischemic changes.  4. Old LEFT insular/parietal lobe, MCA territory infarct.  5. Moderate parenchymal brain volume loss.   CTA HEAD:  05/25/2018 1. Mild segment RIGHT M1 severe stenosis, given prior image this suggest pre-existing stenosis propagation, possible acute component.  2. Occluded distal azygos ACA with poor collateralization.  3. Tandem severe stenosis versus focally occluded LEFT posterior cerebral artery.  Occluded RIGHT P3 segment.   CTA NECK:  05/25/2018 1. No hemodynamically significant stenosis carotid arteries. Patent vertebral arteries.   CT PERFUSION:  05/25/2018 1. Small RIGHT parietal lobe infarct with confluent RIGHT frontoparietal/PCA and posterior watershed penumbra. Aortic Atherosclerosis (ICD10-I70.0).   IR Cerebral Angiogram and Intervention - Dr Marcell Anger 05/25/2018 S/P rt common carotid arteriogram RT CFA approach. FIndings  1.Severe irregular stenosis of RT MCA inf division mid  M2 branch,and sig irregular stenosis of RT MCA mid M1  due to long seg  Irregular ICAD  plaque ,S/P primary stenting with achievement of TICI 3 reperfusion.  [name]  05/25/2018 1. Scattered small foci of acute/early subacute infarction in right posterior frontal lobe, right superior/medial parietal lobe, right posterior cingulate gyrus, and right  splenium of corpus callosum. 2. Additional punctate acute/early subacute infarctions in the right posterior temporal lobe, left superior cerebellum, left posterior limb of internal capsule. 3. No hemorrhage or significant mass effect. 4. Stable background of chronic infarctions, advanced chronic microvascular ischemic changes, and parenchymal volume loss of the brain.  TEE [facility]) 03/27/2018 - Left ventricle: Systolic function was normal. The estimated ejection fraction was in the range of 50% to 55%. - Aortic valve: No evidence of vegetation. - Mitral valve: No evidence of  vegetation. - Left atrium: No evidence of thrombus in the atrial cavity or appendage. - Right atrium: No evidence of thrombus in the atrial cavity or appendage. - Atrial septum: No defect or patent foramen ovale was identified. Echo contrast study showed no right-to-left atrial level shunt, at baseline or with provocation. Impressions:  No cardiac source of emboli was indentified.   PHYSICAL EXAM   GENERAL: elderly appearing AA male, lying in bed, alert, in no acute distress HEENT: Normal EXTREMITIES: No edema  SKIN: warm and dry MENTAL STATUS: He is awake and alert, oriented to person and place.  He has a  modertae dysarthria.  CRANIAL NERVES: Pupils are equal, round and reactive to light and accomodation; extra ocular movements are full, there is no significant nystagmus; visual fields are full; upper and lower facial muscles are normal in strength and symmetric, there is no flattening of the nasolabial folds; tongue is midline; uvula is midline; shoulder elevation is normal. MOTOR: mild right hemiparesisl with  the right leg is 4/5 and the right upper extremity 4/5.  Left leg 5 and left upper extremity 2/5. COORDINATION: No clear dysmetria or tremors noted or ataxia.   ASSESSMENT/PLAN Cory Harvey is a 57 y.o. male with history of previous stroke, tobacco use, hypertension, and depression presenting with left-sided weakness. He did not receive IV t-PA due to late presentation. 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 - multiple old infarcts  CTA H&N - RIGHT M1 severe  stenosis. Occluded distal azygos ACA. Occluded RIGHT P3 segment. Tandem severe stenosis versus focally occluded LEFT posterior cerebral artery.  MRI head - multiple bilateral infarcts  TEE - 03/27/2018 - EF 50 - 55%. No cardiac source of emboli identified.  LDL - 62  HgbA1c - 5.6 VTE prophylaxis - SCDs  aspirin 325 mg daily and clopidogrel 75 mg daily ordered prior to admission but patient not compliant in taking because "I didn't have", now on aspirin 81 mg daily and Brilinta due to new L ICA stent  Patient counseled to be compliant with his antithrombotic medications. Discussed at length  Ongoing aggressive stroke risk factor management  Therapy recommendations:  CIR. Consult pending   Disposition:  Pending  Transfer to the floor  Hypertension  Stable  . Home meds: cozaar 25 . Permissive hypertension (OK if < 220/120) but gradually normalize in 5-7 days . Current meds:  None . Long-term BP goal normotensive  Hyperlipidemia  Lipid lowering medication PTA:  Lipitor 10 mg daily  LDL 62, goal < 70  Lipitor 10 mg daily resumed  Continue statin at discharge  Other Stroke  Risk Factors  Cigarette smoker - advised to stop smoking  UDS - positive for cocaine. Advised to stop using  ETOH use, advised to drink no more than 2 alcoholic beverages per day.  Hx stroke/TIA  03/2018 -  Mult infarcts R cerebral hemisphere, posterior watershed distribution  - TEE neg - asa & plavix - CIR  08/2017 - multiple small L PCA territory acute infarcts - embolic - plavix added  03/2014 - L corpus callosum/gyrus/Lparietal lobe - failed plavix in past. Unable to afford Aggrenox. Continue asa.  Prior to 06/2012 - 3 stroke episodes, last one 3 years prior without residual sx  Family hx stroke (mother)  Hematoma R groin  R groin soft  dsg CDI  Hospital day # 2  Annie MainSharon Biby, MSN, APRN, ANVP-BC, AGPCNP-BC Advanced Practice Stroke Nurse [facility] See Amion for Schedule  & Pager information 05/27/2018 10:14 AM  I have personally examined this patient, reviewed notes, independently viewed imaging studies, participated in medical decision making and plan of care.ROS completed by me personally and pertinent positives fully documented  I have made any additions or clarifications directly to the above note. Agree with note above. He presented with left hemiparesis secondary to symptomatic severely right MCA stenosis and underwent emergent angioplasty stenting. He has history of cocaine, cigarette and alcohol abuse and multiple prior strokes. Patient was counseled to make lifestyle change and to quit smoking cigarettes and cocaine use. Recommend dual antiplatelet therapy as well as aggressive risk factor modification. Transfer to neurological floor bed today and therapy assessment. He'll likely need inpatient rehabilitation. Greater than 50% time during this 35 minute visit was spent on counseling and coordination of care about his symptomatic middle cerebral artery stenosis, need to quit cocaine and cigarettes and answered questions Delia HeadyPramod Ivie Savitt, MD Medical Director [facility] Pager: [phone] 05/27/2018 2:55 PM    To contact Stroke Continuity provider, please refer to [email or web address]. After hours, contact General Neurology

## 2018-05-27 NOTE — Progress Notes (Signed)
Inpatient Rehabilitation Admissions Coordinator  I met with patient at bedside to discuss goals an expectations of an inpt rehab adit. He was previously admitted to [facility] 4/19 and went home supervision level to his Dad's home. He currently lives at home with his brother, Ferd Hibbs, with him when Ferd Hibbs is not working. Pt would like to pursue admit again. I will begin insurance authorization with [facility] for a possible  Admit tomorrow.  Patient has asked me to get his cell charged which I will take to my office and return it today.  Danne Baxter, RN, MSN Rehab Admissions Coordinator [phone] 05/27/2018 1:23 PM

## 2018-05-27 NOTE — Progress Notes (Signed)
Physical Therapy Treatment Patient Details Name: Cory ChildWillie [name] MRN: [id number] DOB: 1961/01/09 Today's Date: 05/27/2018    History of Present Illness Pt is a 57 y/o male who states that he started feeling abnormal around 4:30 pm on 6/14. A code stroke was activated and he arrived to the ED around 01:15 on 6/15 after onset of L sided weakness. On arrival, the patient had an NIH of 16 with severe left hemiparesis, right-sided gaze, left hemianopia. This did improve after CT perfusion scan to an NIH of 8, but he still had significant deficits and then he began to wax and wane.  Given the severity of his deficits and the fact that he had already failed aspirin/Plavix/atorvastatin for his intracranial stenosis, he was taken for emergent intracranial angiography with consideration of intervention. MRI revealed scattered small acute/early subacute infarcts of the R posterior frontal lobe, R parietal lobe, R posterior cingulate gyrus, and R corpus collosum. MRI also showed punctate acute/early subacute infarcts in the R post temporal lobe, L superior cerebellum, and L posterior limb of the internal capsule.     PT Comments    Pt with improved ambulation tolerance and sequencing however continues to remain at a high falls risk. Pt also continues to demonstrate impaired sequencing, impaired strength and sensation in L UE and LE, L sided neglect, impaired vision, decreased safety awareness, and impaired balance. Pt remains to be an excellent candidate for CIR upon discharge to address the above needs. Acute PT to con't to follow.   Follow Up Recommendations  CIR;Supervision/Assistance - 24 hour     Equipment Recommendations  Rolling walker with 5" wheels;3in1 (PT)    Recommendations for Other Services Rehab consult     Precautions / Restrictions Precautions Precautions: Fall Restrictions Weight Bearing Restrictions: No    Mobility  Bed Mobility Overal bed mobility: Needs Assistance Bed  Mobility: Supine to Sit     Supine to sit: Min assist     General bed mobility comments: HOB elevated, increased time to attempt to sequence transfer, pt initiated LE movement of bed and then reached for PT to assist with pulling up to elevate trunk. pt with difficulty using L UE functionally due to weakness and impaired sensation  Transfers Overall transfer level: Needs assistance Equipment used: 1 person hand held assist Transfers: Sit to/from Stand Sit to Stand: Min assist         General transfer comment: pt with wide base of support, impulsively quick,  Ambulation/Gait Ambulation/Gait assistance: Min assist Gait Distance (Feet): 150 Feet Assistive device: 1 person hand held assist Gait Pattern/deviations: Wide base of support;Decreased stride length;Step-through pattern;Shuffle Gait velocity: dec Gait velocity interpretation: <1.8 ft/sec, indicate of risk for recurrent falls General Gait Details: verbal cues to increase step height and length with L foot. pt took every R turn he saw without direction but required max verbal cues for directional cues to the L. Pt with difficulty reading the room numbers. would state "28" but not the 4n on the L hand side. Pt with short shuffled steps, with verbal cues pt can improve fluidity of gait with increased step height and lenght but is unable to maintain it.   Stairs             Wheelchair Mobility    Modified Rankin (Stroke Patients Only) Modified Rankin (Stroke Patients Only) Pre-Morbid Rankin Score: No significant disability Modified Rankin: Moderately severe disability     Balance Overall balance assessment: Needs assistance Sitting-balance support: Feet supported Sitting balance-Leahy  Scale: Fair     Standing balance support: No upper extremity supported;During functional activity Standing balance-Leahy Scale: Fair Standing balance comment: pt stood at sink x 2 min to wash hands. Pt required max directional  verbal cues to use L hand. verbal cues to manage the soap and water faucet and then to find the paper towel dispenser and trash can on the L hand side. Pt did lather hands with soap and then tried to dry it with a paper towel however forgot to rinse hands with water               High Level Balance Comments: provided moderate pertebations during ambulation. pt required minA to maitain balance. pt did have a reactive response in attempt to catch self but did require minA to prevent fall            Cognition Arousal/Alertness: Awake/alert Behavior During Therapy: Flat affect Overall Cognitive Status: Impaired/Different from baseline Area of Impairment: Orientation;Attention;Memory;Following commands;Safety/judgement;Awareness;Problem solving                 Orientation Level: Disoriented to;Time;Situation Current Attention Level: Sustained Memory: Decreased short-term memory Following Commands: Follows one step commands with increased time;Follows one step commands inconsistently(can't navigate Right from Left) Safety/Judgement: Decreased awareness of safety;Decreased awareness of deficits Awareness: Intellectual Problem Solving: Slow processing;Decreased initiation;Requires verbal cues;Difficulty sequencing;Requires tactile cues General Comments: pt tends to do everything towards the Right despite vc's to "turn L" or use "L hand". L sided inattention noted      Exercises      General Comments General comments (skin integrity, edema, etc.): VSS      Pertinent Vitals/Pain Pain Assessment: No/denies pain    Home Living                      Prior Function            PT Goals (current goals can now be found in the care plan section) Acute Rehab PT Goals Patient Stated Goal: didn't state Progress towards PT goals: Progressing toward goals    Frequency    Min 4X/week      PT Plan Current plan remains appropriate    Co-evaluation               AM-PAC PT "6 Clicks" Daily Activity  Outcome Measure  Difficulty turning over in bed (including adjusting bedclothes, sheets and blankets)?: Unable Difficulty moving from lying on back to sitting on the side of the bed? : Unable Difficulty sitting down on and standing up from a chair with arms (e.g., wheelchair, bedside commode, etc,.)?: Unable Help needed moving to and from a bed to chair (including a wheelchair)?: A Little Help needed walking in hospital room?: A Little Help needed climbing 3-5 steps with a railing? : A Lot 6 Click Score: 11    End of Session Equipment Utilized During Treatment: Gait belt Activity Tolerance: Patient tolerated treatment well Patient left: in chair;with chair alarm set Nurse Communication: Mobility status PT Visit Diagnosis: Unsteadiness on feet (R26.81);Hemiplegia and hemiparesis Hemiplegia - Right/Left: Left Hemiplegia - dominant/non-dominant: Non-dominant Hemiplegia - caused by: Cerebral infarction     Time: 9604-5409 PT Time Calculation (min) (ACUTE ONLY): 26 min  Charges:  $Gait Training: 8-22 mins $Therapeutic Activity: 8-22 mins                    G Codes:       Lewis Shock, PT, DPT Pager #: [phone] Office #:  [phone]    Cory Harvey 05/27/2018, 10:18 AM

## 2018-05-28 ENCOUNTER — Encounter (HOSPITAL_COMMUNITY): Payer: Self-pay | Admitting: Interventional Radiology

## 2018-05-28 NOTE — [place] FL2 (Addendum)
[place] MEDICAID FL2 LEVEL OF CARE SCREENING TOOL     IDENTIFICATION  Patient Name: Cory ChildWillie James Langenfeld Birthdate: [birth year] Sex: male Admission Date (Current Location): 05/25/2018  [facility] [place]   Facility and Address:  [facility] [place], [address], [place], [place] [address]      Provider Number: [id number]  Attending Physician Name and Address:  Micki RileySethi, Geneva Barrero S, MD  Relative Name and Phone Number:       Current Level of Care: Hospital Recommended Level of Care: Skilled Nursing Facility Prior Approval Number:    Date Approved/Denied:   PASRR Number: Manual review  Discharge Plan: SNF    Current Diagnoses: Patient Active Problem List   Diagnosis Date Noted  . Stroke (cerebrum) (HCC) 05/25/2018  . Middle cerebral artery stenosis, right 05/25/2018  . Cerebrovascular accident (CVA) involving right cerebral hemisphere (HCC) 03/29/2018  . CVA (cerebral vascular accident) (HCC) 03/26/2018  . Acute CVA (cerebrovascular accident) (HCC) 09/02/2017  . Gait disturbance 09/02/2017  . History of CVA (cerebrovascular accident) 04/18/2017  . Tobacco use 04/18/2017  . Depression   . Hypertension   . Stroke (HCC)     Orientation RESPIRATION BLADDER Height & Weight     Self, Time, Situation, Place  Normal Continent Weight: 176 lb 5.9 oz (80 kg) Height:  5\' 9"  (175.3 cm)  BEHAVIORAL SYMPTOMS/MOOD NEUROLOGICAL BOWEL NUTRITION STATUS      Continent Diet  AMBULATORY STATUS COMMUNICATION OF NEEDS Skin   Limited Assist Verbally Surgical wounds(closed right groin, gauze dressing)                       Personal Care Assistance Level of Assistance  Bathing, Feeding, Dressing Bathing Assistance: Limited assistance Feeding assistance: Independent Dressing Assistance: Limited assistance     Functional Limitations Info  Sight, Hearing, Speech Sight Info: Impaired Hearing Info: Adequate Speech Info: Impaired    SPECIAL CARE  FACTORS FREQUENCY  PT (By licensed PT), OT (By licensed OT)     PT Frequency: 5x/wk OT Frequency: 5x/wk            Contractures Contractures Info: Not present    Additional Factors Info  Code Status, Allergies Code Status Info: Full Allergies Info: NKA           Current Medications (05/28/2018):  This is the current hospital active medication list Current Facility-Administered Medications  Medication Dose Route Frequency Provider Last Rate Last Dose  . 0.9 %  sodium chloride infusion   Intravenous Continuous Julieanne Cottoneveshwar, Sanjeev, MD   Stopped at 05/26/18 2000  . acetaminophen (TYLENOL) tablet 650 mg  650 mg Oral Q4H PRN Julieanne Cottoneveshwar, Sanjeev, MD   650 mg at 05/26/18 1600   Or  . acetaminophen (TYLENOL) solution 650 mg  650 mg Per Tube Q4H PRN Deveshwar, Simonne MaffucciSanjeev, MD       Or  . acetaminophen (TYLENOL) suppository 650 mg  650 mg Rectal Q4H PRN Deveshwar, Sanjeev, MD      . aspirin chewable tablet 81 mg  81 mg Oral Daily Deveshwar, Sanjeev, MD   81 mg at 05/28/18 1005   Or  . aspirin chewable tablet 81 mg  81 mg Per Tube Daily Deveshwar, Sanjeev, MD      . atorvastatin (LIPITOR) tablet 10 mg  10 mg Oral q1800 Rinehuls, David L, PA-C   10 mg at 05/27/18 1748  . ticagrelor (BRILINTA) tablet 90 mg  90 mg Oral BID Julieanne Cottoneveshwar, Sanjeev, MD  90 mg at 05/28/18 1005   Or  . ticagrelor (BRILINTA) tablet 90 mg  90 mg Per Tube BID Julieanne Cotton, MD         Discharge Medications: Please see discharge summary for a list of discharge medications.  Relevant Imaging Results:  Relevant Lab Results:   Additional Information SS#: [id number]  [place], [place]  I have personally examined this patient, reviewed notes, independently viewed imaging studies, participated in medical decision making and plan of care.ROS completed by me personally and pertinent positives fully documented  I have made any additions or clarifications directly to the above note. Agree with note above.     Delia Heady, MD Medical Director [facility] Pager: [phone] 05/29/2018 8:18 AM

## 2018-05-28 NOTE — Progress Notes (Signed)
Physical Therapy Treatment Patient Details Name: Cory Harvey MRN: [id number] DOB: 1961/07/05 Today's Date: 05/28/2018    History of Present Illness Cory Harvey is a 57 y.o. 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  He lives alone.  He  Presented to hospital 05/25/2018 with increasing left-sided weakness as well as right-sided gaze.  Urine drug screen positive cocaine.  Cranial CT scan showed no acute process.  Patient did not receive TPA..   CT perfusion scan showed small right parietal lobe infarct with confluent right frontoparietal/PCA and posterior watershed penumbra.  Patient continued to wax and wane.  Interventional radiology cerebral angiogram and underwent stenting right MCA inferior division M2 branch.       PT Comments    Patient is making progress toward PT goals and is eager to participate in therapy. Pt does continue to present with L side weakness and inattention and remains at risk for falls. Pt will continue to benefit from further skilled PT services to maximize independence and safety with mobility.    Follow Up Recommendations  CIR;Supervision/Assistance - 24 hour     Equipment Recommendations  Rolling walker with 5" wheels;3in1 (PT)    Recommendations for Other Services Rehab consult     Precautions / Restrictions Precautions Precautions: Fall Precaution Comments: L side inattention Restrictions Weight Bearing Restrictions: No    Mobility  Bed Mobility Overal bed mobility: Needs Assistance Bed Mobility: Supine to Sit     Supine to sit: Min assist     General bed mobility comments: pt rolled toward L side and using R UE and bed rail able to come half way into sitting and required min A to elevate trunk into sitting upright  Transfers Overall transfer level: Needs  assistance Equipment used: 1 person hand held assist Transfers: Sit to/from Stand Sit to Stand: Min assist         General transfer comment: pt stood X3; assist for balance upon standing first 2 trials and third trial min guard   Ambulation/Gait Ambulation/Gait assistance: Min assist;Min guard   Assistive device: None(assist at trunk with gait belt when needed) Gait Pattern/deviations: Step-through pattern;Decreased step length - right;Decreased step length - left;Wide base of support   Gait velocity interpretation: <1.8 ft/sec, indicate of risk for recurrent falls General Gait Details: pt with decreased bilat step lengths initially and able to improve with increased distance and vc; pt continues to present with externally rotated L LE; pt able to perform horizontal head turns L and R without significant gait deviations    Stairs             Wheelchair Mobility    Modified Rankin (Stroke Patients Only) Modified Rankin (Stroke Patients Only) Pre-Morbid Rankin Score: No significant disability Modified Rankin: Moderately severe disability     Balance Overall balance assessment: Needs assistance Sitting-balance support: Feet supported Sitting balance-Leahy Scale: Fair     Standing balance support: No upper extremity supported;During functional activity Standing balance-Leahy Scale: Fair Standing balance comment: Pt able to maintain static standing with close min guard assist                High Level Balance Comments: pt able to maintain tandem stance bilaterally with appropriate righting reactions but unable to single leg stance             Cognition Arousal/Alertness: Awake/alert Behavior During  Therapy: Flat affect(labile at times ) Overall Cognitive Status: Impaired/Different from baseline Area of Impairment: Attention;Following commands;Problem solving;Awareness;Safety/judgement                   Current Attention Level: Selective    Following Commands: Follows one step commands consistently;Follows multi-step commands inconsistently Safety/Judgement: Decreased awareness of safety Awareness: Intellectual Problem Solving: Slow processing;Difficulty sequencing;Requires verbal cues;Requires tactile cues General Comments: L side inattention; pt did turn in hallway and look toward L side without cues the session      Exercises      General Comments General comments (skin integrity, edema, etc.): pt tearful during session and upset about his current situation/mobility level      Pertinent Vitals/Pain Pain Assessment: No/denies pain    Home Living                      Prior Function            PT Goals (current goals can now be found in the care plan section) Progress towards PT goals: Progressing toward goals    Frequency    Min 4X/week      PT Plan Current plan remains appropriate    Co-evaluation              AM-PAC PT "6 Clicks" Daily Activity  Outcome Measure  Difficulty turning over in bed (including adjusting bedclothes, sheets and blankets)?: Unable Difficulty moving from lying on back to sitting on the side of the bed? : Unable Difficulty sitting down on and standing up from a chair with arms (e.g., wheelchair, bedside commode, etc,.)?: Unable Help needed moving to and from a bed to chair (including a wheelchair)?: A Little Help needed walking in hospital room?: A Little Help needed climbing 3-5 steps with a railing? : A Lot 6 Click Score: 11    End of Session Equipment Utilized During Treatment: Gait belt Activity Tolerance: Patient tolerated treatment well Patient left: in bed;with call bell/phone within reach;with bed alarm set Nurse Communication: Mobility status PT Visit Diagnosis: Unsteadiness on feet (R26.81);Hemiplegia and hemiparesis Hemiplegia - Right/Left: Left Hemiplegia - dominant/non-dominant: Non-dominant Hemiplegia - caused by: Cerebral infarction      Time: 1610-9604 PT Time Calculation (min) (ACUTE ONLY): 32 min  Charges:  $Gait Training: 8-22 mins $Neuromuscular Re-education: 8-22 mins                    G Codes:       Erline Levine, PTA Pager: [phone]     Carolynne Edouard 05/28/2018, 11:40 AM

## 2018-05-28 NOTE — Progress Notes (Addendum)
STROKE TEAM PROGRESS NOTE  SUBJECTIVE (INTERVAL HISTORY) No family present. CIR recommended but pt wants SNF. SW involved.   OBJECTIVE Temp:  [97.7 F (36.5 C)-98.7 F (37.1 C)] 97.9 F (36.6 C) (06/18 1015) Pulse Rate:  [57-68] 62 (06/18 1015) Cardiac Rhythm: Normal sinus rhythm (06/18 0800) Resp:  [15-24] 20 (06/18 1015) BP: (119-167)/(76-135) 122/96 (06/18 1015) SpO2:  [99 %-100 %] 100 % (06/18 1015) Weight:  [80 kg (176 lb 5.9 oz)] 80 kg (176 lb 5.9 oz) (06/17 2134)  CBC:  Recent Labs  Lab 05/25/18 0124 05/25/18 0140 05/26/18 0600  WBC 8.8  --  8.6  NEUTROABS 6.6  --  6.4  HGB 15.9 17.3* 14.0  HCT 49.9 51.0 43.2  MCV 85.6  --  83.7  PLT 397  --  362    Basic Metabolic Panel:  Recent Labs  Lab 05/25/18 0124 05/25/18 0140 05/26/18 0600  NA 140 141 139  K 3.8 3.6 3.5  CL 108 105 110  CO2 22  --  23  GLUCOSE 90 87 79  BUN 10 11 <5*  CREATININE 1.25* 1.20 1.03  CALCIUM 9.3  --  8.3*    Lipid Panel:     Component Value Date/Time   CHOL 125 05/25/2018 0915   CHOL 150 09/21/2014 0408   TRIG 54 05/25/2018 0915   TRIG 40 09/21/2014 0408   HDL 52 05/25/2018 0915   HDL 76 (H) 09/21/2014 0408   CHOLHDL 2.4 05/25/2018 0915   VLDL 11 05/25/2018 0915   VLDL 8 09/21/2014 0408   LDLCALC 62 05/25/2018 0915   LDLCALC 66 09/21/2014 0408   HgbA1c:  Lab Results  Component Value Date   HGBA1C 5.6 05/25/2018   Urine Drug Screen:     Component Value Date/Time   LABOPIA NONE DETECTED 05/25/2018 0600   COCAINSCRNUR POSITIVE (A) 05/25/2018 0600   LABBENZ NONE DETECTED 05/25/2018 0600   AMPHETMU NONE DETECTED 05/25/2018 0600   THCU NONE DETECTED 05/25/2018 0600   LABBARB (A) 05/25/2018 0600    Result not available. Reagent lot number recalled by manufacturer.    Alcohol Level     Component Value Date/Time   ETH <10 05/25/2018 0124    IMAGING  Ct Head Code Stroke Wo Contrast 05/25/2018 1. No acute intracranial process.  2. ASPECTS is 10.  3. Old small  pontine, basal ganglia and thalamus infarcts. Moderate to severe chronic small vessel ischemic changes.  4. Old LEFT insular/parietal lobe, MCA territory infarct.  5. Moderate parenchymal brain volume loss.   CTA HEAD:  05/25/2018 1. Mild segment RIGHT M1 severe stenosis, given prior image this suggest pre-existing stenosis propagation, possible acute component.  2. Occluded distal azygos ACA with poor collateralization.  3. Tandem severe stenosis versus focally occluded LEFT posterior cerebral artery.  Occluded RIGHT P3 segment.   CTA NECK:  05/25/2018 1. No hemodynamically significant stenosis carotid arteries. Patent vertebral arteries.   CT PERFUSION:  05/25/2018 1. Small RIGHT parietal lobe infarct with confluent RIGHT frontoparietal/PCA and posterior watershed penumbra. Aortic Atherosclerosis (ICD10-I70.0).   IR Cerebral Angiogram and Intervention - Dr Marcell Anger 05/25/2018 S/P rt common carotid arteriogram RT CFA approach. FIndings  1.Severe irregular stenosis of RT MCA inf division mid  M2 branch,and sig irregular stenosis of RT MCA mid M1  due to long seg  Irregular ICAD  plaque ,S/P primary stenting with achievement of TICI 3 reperfusion.  [name]  05/25/2018 1. Scattered small foci of acute/early subacute infarction in right posterior  frontal lobe, right superior/medial parietal lobe, right posterior cingulate gyrus, and right splenium of corpus callosum. 2. Additional punctate acute/early subacute infarctions in the right posterior temporal lobe, left superior cerebellum, left posterior limb of internal capsule. 3. No hemorrhage or significant mass effect. 4. Stable background of chronic infarctions, advanced chronic microvascular ischemic changes, and parenchymal volume loss of the brain.  TEE [facility]) 03/27/2018 - Left ventricle: Systolic function was normal. The estimated ejection fraction was in the range of 50% to 55%. - Aortic valve: No evidence of  vegetation. - Mitral valve: No evidence of vegetation. - Left atrium: No evidence of thrombus in the atrial cavity or appendage. - Right atrium: No evidence of thrombus in the atrial cavity or appendage. - Atrial septum: No defect or patent foramen ovale was identified. Echo contrast study showed no right-to-left atrial level shunt, at baseline or with provocation. Impressions:  No cardiac source of emboli was indentified.   PHYSICAL EXAM    GENERAL: elderly appearing AA male, lying in bed, alert, in no acute distress HEENT: Normal EXTREMITIES: No edema  SKIN: warm and dry MENTAL STATUS: He is awake and alert, oriented to person and place.  He has a  modertae dysarthria.  CRANIAL NERVES: Pupils are equal, round and reactive to light and accomodation; extra ocular movements are full, there is no significant nystagmus; visual fields are full; upper and lower facial muscles are normal in strength and symmetric, there is no flattening of the nasolabial folds; tongue is midline; uvula is midline; shoulder elevation is normal. MOTOR: mild right hemiparesisl with  the right leg is 4/5 and the right upper extremity 4/5.  Left leg 5 and left upper extremity 2/5. COORDINATION: No clear dysmetria or tremors noted or ataxia.   ASSESSMENT/PLAN Mr. Rockney Grenz is a 57 y.o. male with history of previous stroke, tobacco use, hypertension, and depression presenting with left-sided weakness. He did not receive IV t-PA due to late presentation. 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 - multiple  old infarcts  CTA H&N - RIGHT M1 severe stenosis. Occluded distal azygos ACA. Occluded RIGHT P3 segment. Tandem severe stenosis versus focally occluded LEFT posterior cerebral artery.  MRI head - multiple bilateral infarcts  TEE - 03/27/2018 - EF 50 - 55%. No cardiac source of emboli identified.  LDL - 62  HgbA1c - 5.6 VTE prophylaxis - SCDs  aspirin 325 mg daily and clopidogrel 75 mg daily ordered prior to admission but patient not compliant in taking because "I didn't have", now on aspirin 81 mg daily and Brilinta due to new R ICA stent  Patient counseled to be compliant with his antithrombotic medications. Discussed at length  Ongoing aggressive stroke risk factor management  Therapy recommendations:  CIR-> pt prefers SNF.   Disposition:  Pending. Medically ready for d/c when bed found  Hypertension  Stable  . Home meds: cozaar 25 . Permissive hypertension (OK if < 220/120) but gradually normalize in 5-7 days . Current meds:  None . BP 130-160s . Long-term BP goal normotensive  Hyperlipidemia  Lipid lowering medication PTA:  Lipitor 10 mg daily  LDL 62, goal < 70  Lipitor 10 mg daily resumed  Continue statin at discharge  Other Stroke Risk Factors  Cigarette smoker - advised to stop smoking  UDS - positive for cocaine. Advised to stop using  ETOH use, advised to drink no more than 2 alcoholic beverages per day.  Hx stroke/TIA  03/2018 -  Mult infarcts R cerebral hemisphere, posterior watershed distribution  - TEE neg - asa & plavix - CIR  08/2017 - multiple small L PCA territory acute infarcts - embolic - plavix added  03/2014 - L corpus callosum/gyrus/Lparietal lobe - failed plavix in past. Unable to afford Aggrenox. Continue asa.  Prior to 06/2012 - 3 stroke episodes, last one 3 years prior without residual sx  Family hx stroke (mother)  Hematoma R groin  R groin soft  dsg remains CDI  Hospital day # 3  Annie MainSharon Biby, MSN, APRN, ANVP-BC,  AGPCNP-BC Advanced Practice Stroke Nurse [facility] See Amion for Schedule & Pager information 05/28/2018 12:33 PM  Patient is awaiting transfer to skilled nursing facility for rehabilitation which he prefers our inpatient rehabilitation. Medically stable for transfer when bed available. Long discussion with the patient and answered questions. Delia HeadyPramod Sethi, MD Medical Director [facility] Pager: [phone] 05/28/2018 3:07 PM    To contact Stroke Continuity provider, please refer to [email or web address]. After hours, contact General Neurology

## 2018-05-28 NOTE — Care Management Note (Signed)
Case Management Note  Patient Details  Name: Cory Harvey MRN: [id number] Date of Birth: [birth year]  Subjective/Objective:      Pt admitted with CVA. He is from home alone.               Action/Plan: Recommendation are for CIR. Awaiting insurance authorization. CM following for d/c disposition.  Expected Discharge Date:                  Expected Discharge Plan:  IP Rehab Facility  In-House Referral:  Clinical Social Work  Discharge planning Services  CM Consult  Post Acute Care Choice:    Choice offered to:     DME Arranged:    DME Agency:     HH Arranged:    HH Agency:     Status of Service:  In process, will continue to follow  If discussed at Long Length of Stay Meetings, dates discussed:    Additional Comments:  Kermit BaloKelli F Tanequa Kretz, RN 05/28/2018, 10:45 AM

## 2018-05-28 NOTE — Progress Notes (Signed)
  Speech Language Pathology Treatment: Dysphagia;Cognitive-Linquistic  Patient Details Name: Cory ChildWillie James Mizrahi MRN: [id number] DOB: 05-Dec-1961 Today's Date: 05/28/2018 Time: [id number] SLP Time Calculation (min) (ACUTE ONLY): 39 min  Assessment / Plan / Recommendation Clinical Impression  Skilled intervention included treatment for pt's dysarthria/speech fluency deficits.  Pt reports issues with stuttering as a child - attempted compensation strategies including reading and singing to maxmize speech fluency, reading outloud not helpful however singing very fluent.  Advised pt trial of putting to melody his novel communication for improved fluency.  Pt's intelligibilty decreases with lengthening of utterances and he demonstrates increased rate of speech, called [employer] program in [place] and left message to inquire if they had loaner DAF (delayed auditory feedback) devices for him to trial as he reports worsening stuttering with this event.  Left information with pt regarding Delayed Auditory Feedback.  Will follow up for skilled SLP to help maximize fluency per pt wishes.       HPI HPI: Cory Harvey is a 57 y.o. male who states that he started feeling abnormal around 4:30 pm. He was still doing his activities and moving well until around 8:45 pm on 6/14, however. At that time, he developed left sided weakness and EMS was called. A code stroke was activated and he arrived aroun 01:15 on 6/15.  On arrival, the patient had an NIH of 16 with severe left hemiparesis, right-sided gaze, left hemianopia.  This did improve after CT perfusion scan to an NIH of 8, but he still had significant deficits and then he began to wax and wane.  Given the severity of his deficits and the fact that he had already failed aspirin/Plavix/atorvastatin for his intracranial stenosis, he was taken for emergent intracranial angiography with consideration of intervention.  Patient is known to ST service from previous  admission with stay on CIR.  MOCA-B given with a score of 16/30.       SLP Plan  Continue with current plan of care       Recommendations                   Follow up Recommendations: Skilled Nursing facility(pt wants SNF) SLP Visit Diagnosis: Dysarthria and anarthria (R47.1) Plan: Continue with current plan of care       GO              Donavan Burnetamara Malu Pellegrini, MS [facility] SLP [phone]   Chales AbrahamsKimball, Aliviana Burdell Ann 05/28/2018, 3:26 PM

## 2018-05-28 NOTE — Progress Notes (Signed)
   05/28/18 0900  Clinical Encounter Type  Visited With Patient  Visit Type Follow-up;Spiritual support;Social support  Referral From Nurse  Consult/Referral To Chaplain  Spiritual Encounters  Spiritual Needs Emotional  Stress Factors  Patient Stress Factors Major life changes  Chaplain was called to visit with the Pt.  The Pt has substance abuse issues on top of his current medical issues.  Chaplain, in speaking with the PT discovered that he grew up in a single family household where dad was not present.  He feels those issues with his dad helped contribute to his finding solstice in substances.  The PT stated that he is ready for change and has outlined a plan for change.  1- Changing his circle of friends that are negatively influential, 2- find a genuine relationship with God, 3-Seek help in talking with a therapist about issues from childhood that contribute to his place in society as a man.  Chaplain will follow up with PT tomorrow if possible (depending on placement)

## 2018-05-28 NOTE — Progress Notes (Signed)
Inpatient Rehabilitation Admissions Coordinator  I met with patient at bedside to review co pays for CIR admit if insurance would approve his admit. He prefers now SNF rehab admit in the [place] area. I have notified RN CM and SW of SNF request. I also left a voicemail for pt's Dad, Mortimer Fries of pt's admission and requested pt's Dad to contact him on his room phone.  We will sign off at this time. I have withdrawn my insurance authorization request with [facility].  Danne Baxter, RN, MSN Rehab Admissions Coordinator [phone] 05/28/2018 11:18 AM

## 2018-05-29 ENCOUNTER — Encounter (HOSPITAL_COMMUNITY): Payer: Self-pay

## 2018-05-29 DIAGNOSIS — S301XXA Contusion of abdominal wall, initial encounter: Secondary | ICD-10-CM

## 2018-05-29 DIAGNOSIS — E785 Hyperlipidemia, unspecified: Secondary | ICD-10-CM

## 2018-05-29 DIAGNOSIS — F1410 Cocaine abuse, uncomplicated: Secondary | ICD-10-CM

## 2018-05-29 LAB — CBC
HCT: 48.8 % (ref 39.0–52.0)
Hemoglobin: 15.5 g/dL (ref 13.0–17.0)
MCH: 27.1 pg (ref 26.0–34.0)
MCHC: 31.8 g/dL (ref 30.0–36.0)
MCV: 85.5 fL (ref 78.0–100.0)
Platelets: 414 10*3/uL — ABNORMAL HIGH (ref 150–400)
RBC: 5.71 MIL/uL (ref 4.22–5.81)
RDW: 15.2 % (ref 11.5–15.5)
WBC: 7.7 10*3/uL (ref 4.0–10.5)

## 2018-05-29 MED ORDER — TICAGRELOR 90 MG PO TABS: 90.0000 mg | ORAL_TABLET | Freq: Two times a day (BID) | ORAL | 2 refills | Status: AC

## 2018-05-29 MED ORDER — ASPIRIN 81 MG PO CHEW: 81.0000 mg | CHEWABLE_TABLET | Freq: Every day | ORAL | 2 refills | Status: AC

## 2018-05-29 MED ORDER — ATORVASTATIN CALCIUM 10 MG PO TABS: 10.0000 mg | ORAL_TABLET | Freq: Every day | ORAL | 2 refills | Status: DC

## 2018-05-29 NOTE — Progress Notes (Addendum)
STROKE TEAM PROGRESS NOTE  SUBJECTIVE (INTERVAL HISTORY) No family present. Pt up at sink with OT brushing his teeth. Stable. Ready for d/c.   OBJECTIVE Temp:  [97.9 F (36.6 C)-98.4 F (36.9 C)] 98.3 F (36.8 C) (06/19 0740) Pulse Rate:  [59-70] 61 (06/19 0740) Cardiac Rhythm: Other (Comment) (06/19 0700) Resp:  [18-20] 20 (06/19 0740) BP: (122-149)/(88-100) 130/100 (06/19 0740) SpO2:  [95 %-100 %] 95 % (06/19 0740)  CBC:  Recent Labs  Lab 05/25/18 0124 05/25/18 0140 05/26/18 0600  WBC 8.8  --  8.6  NEUTROABS 6.6  --  6.4  HGB 15.9 17.3* 14.0  HCT 49.9 51.0 43.2  MCV 85.6  --  83.7  PLT 397  --  362    Basic Metabolic Panel:  Recent Labs  Lab 05/25/18 0124 05/25/18 0140 05/26/18 0600  NA 140 141 139  K 3.8 3.6 3.5  CL 108 105 110  CO2 22  --  23  GLUCOSE 90 87 79  BUN 10 11 <5*  CREATININE 1.25* 1.20 1.03  CALCIUM 9.3  --  8.3*   IMAGING  Ct Head Code Stroke Wo Contrast 05/25/2018 1. No acute intracranial process.  2. ASPECTS is 10.  3. Old small pontine, basal ganglia and thalamus infarcts. Moderate to severe chronic small vessel ischemic changes.  4. Old LEFT insular/parietal lobe, MCA territory infarct.  5. Moderate parenchymal brain volume loss.   CTA HEAD:  05/25/2018 1. Mild segment RIGHT M1 severe stenosis, given prior image this suggest pre-existing stenosis propagation, possible acute component.  2. Occluded distal azygos ACA with poor collateralization.  3. Tandem severe stenosis versus focally occluded LEFT posterior cerebral artery.  Occluded RIGHT P3 segment.   CTA NECK:  05/25/2018 1. No hemodynamically significant stenosis carotid arteries. Patent vertebral arteries.   CT PERFUSION:  05/25/2018 1. Small RIGHT parietal lobe infarct with confluent RIGHT frontoparietal/PCA and posterior watershed penumbra. Aortic Atherosclerosis (ICD10-I70.0).   IR Cerebral Angiogram and Intervention - Dr Corliss Skains 05/25/2018 S/P rt common carotid  arteriogram RT CFA approach. FIndings  1.Severe irregular stenosis of RT MCA inf division mid  M2 branch,and sig irregular stenosis of RT MCA mid M1  due to long seg  Irregular ICAD  plaque ,S/P primary stenting with achievement of TICI 3 reperfusion.  [name]  05/25/2018 1. Scattered small foci of acute/early subacute infarction in right posterior frontal lobe, right superior/medial parietal lobe, right posterior cingulate gyrus, and right splenium of corpus callosum. 2. Additional punctate acute/early subacute infarctions in the right posterior temporal lobe, left superior cerebellum, left posterior limb of internal capsule. 3. No hemorrhage or significant mass effect. 4. Stable background of chronic infarctions, advanced chronic microvascular ischemic changes, and parenchymal volume loss of the brain.  [facility]) 03/27/2018 - Left ventricle: Systolic function was normal. The estimated ejection fraction was in the range of 50% to 55%. - Aortic valve: No evidence of vegetation. - Mitral valve: No evidence of vegetation. - Left atrium: No evidence of thrombus in the atrial cavity or appendage. - Right atrium: No evidence of thrombus in the atrial cavity or appendage. - Atrial septum: No defect or patent foramen ovale was identified. Echo contrast study showed no right-to-left atrial level shunt, at baseline or with provocation. Impressions:  No cardiac source of emboli was indentified.   PHYSICAL EXAM per Pearlean Brownie    GENERAL: elderly appearing AA male, lying in bed, alert, in no acute distress HEENT: Normal EXTREMITIES: No edema  SKIN: warm and  dry MENTAL STATUS: He is awake and alert, oriented to person and place.  He has a  modertae dysarthria.  CRANIAL NERVES: Pupils are equal, round and reactive to light and accomodation; extra ocular movements are full, there is no significant nystagmus; visual fields are full; upper and lower facial muscles are normal in strength and  symmetric, there is no flattening of the nasolabial folds; tongue is midline; uvula is midline; shoulder elevation is normal. MOTOR: mild right hemiparesisl with  the right leg is 4/5 and the right upper extremity 4/5.  Left leg 5 and left upper extremity 2/5. COORDINATION: No clear dysmetria or tremors noted or ataxia. No change in neuro exam   ASSESSMENT/PLAN Cory Harvey is a 57 y.o. male with history of previous stroke, tobacco use, hypertension, and depression presenting with left-sided weakness. He did not receive IV t-PA due to late presentation. 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 - multiple old infarcts  CTA H&N - RIGHT M1 severe stenosis. Occluded distal azygos ACA. Occluded RIGHT P3 segment. Tandem severe stenosis versus focally occluded LEFT posterior cerebral artery.  MRI head - multiple bilateral infarcts  TEE - 03/27/2018 - EF 50 - 55%. No cardiac source of emboli identified.  LDL - 62  HgbA1c - 5.6 VTE prophylaxis - SCDs  aspirin 325 mg daily and clopidogrel 75 mg daily ordered prior to admission but patient not compliant in taking because "I didn't have", now on aspirin 81 mg daily and Brilinta due to new R ICA stent  Therapy recommendations:  CIR-> pt prefers SNF.   Disposition:  Pending. Medically ready for d/c when bed found  Hypertension  Stable  . Home meds: cozaar 25 . Permissive hypertension (OK if < 220/120) but gradually normalize in 5-7 days . Current meds:  None . BP 120-140s . Long-term BP goal normotensive . Resume  cozaar  Hyperlipidemia  Lipid lowering medication PTA:  Lipitor 10 mg daily  LDL 62, goal < 70  Lipitor 10 mg daily resumed  Continue statin at discharge  Other Stroke Risk Factors  Cigarette smoker - advised to stop smoking  UDS - positive for cocaine. Advised to stop using  ETOH use, advised to drink no more than 2 alcoholic beverages per day.  Hx stroke/TIA  03/2018 -  Mult infarcts R cerebral hemisphere, posterior watershed distribution  - TEE neg - asa & plavix - CIR  08/2017 - multiple small L PCA territory acute infarcts - embolic - plavix added  03/2014 - L corpus callosum/gyrus/Lparietal lobe - failed plavix in past. Unable to afford Aggrenox. Continue asa.  Prior to 06/2012 - 3 stroke episodes, last one 3 years prior without residual sx  Family hx stroke (mother)  Hematoma R groin  R groin soft  dsg remains CDI  Elevated HGB 17.3 last check. Will recheck  D/c condom cath  Hospital day # 4  Annie Main, MSN, APRN, ANVP-BC, AGPCNP-BC Advanced Practice Stroke Nurse [facility] See Amion for Schedule & Pager information 05/29/2018 8:08 AM  I have personally examined this patient, reviewed notes, independently viewed imaging studies, participated  in medical decision making and plan of care.ROS completed by me personally and pertinent positives fully documented  I have made any additions or clarifications directly to the above note. Agree with note above. Patient is medically stable to be discharged to skilled nursing facility when bed is available. Delia HeadyPramod Eligah Anello, MD Medical Director [facility] Pager: [phone] 05/29/2018 2:21 PM   To contact Stroke Continuity provider, please refer to [email or web address]. After hours, contact General Neurology

## 2018-05-29 NOTE — Clinical Social Work Placement (Addendum)
Nurse to call report to [phone], Room 08a    CLINICAL SOCIAL WORK PLACEMENT  NOTE  Date:  05/29/2018  Patient Details  Name: Cory Harvey MRN: [id number] Date of Birth: 07-13-1961  Clinical Social Work is seeking post-discharge placement for this patient at the Skilled  Nursing Facility level of care (*CSW will initial, date and re-position this form in  chart as items are completed):  Yes   Patient/family provided with [facility] Clinical Social Work Department's list of facilities offering this level of care within the geographic area requested by the patient (or if unable, by the patient's family).  Yes   Patient/family informed of their freedom to choose among providers that offer the needed level of care, that participate in Medicare, Medicaid or managed care program needed by the patient, have an available bed and are willing to accept the patient.  Yes   Patient/family informed of [facility]'s ownership interest in [facility] and [facility], as well as of the fact that they are under no obligation to receive care at these facilities.  PASRR submitted to EDS on       PASRR number received on 05/28/18     Existing PASRR number confirmed on       FL2 transmitted to all facilities in geographic area requested by pt/family on 05/28/18     FL2 transmitted to all facilities within larger geographic area on       Patient informed that his/her managed care company has contracts with or will negotiate with certain facilities, including the following:        Yes   Patient/family informed of bed offers received.  Patient chooses bed at [facility]     Physician recommends and patient chooses bed at      [facility] to [facility] on 05/29/18.  Patient to be transferred to facility by [facility]     Patient family notified on 05/29/18 of transfer.  Name of family member notified:        PHYSICIAN       Additional Comment:     _______________________________________________ Baldemar LenisElizabeth M Winson Eichorn, LCSW 05/29/2018, 2:58 PM

## 2018-05-29 NOTE — Discharge Summary (Addendum)
Stroke Discharge Summary  Patient ID: Cory Harvey   MRN: [id number]      DOB: 01/23/1961  Date of Admission: 05/25/2018 Date of Discharge: 05/29/2018  Attending Physician:  Micki Riley, MD, Stroke MD Consultant(s):     Roxanne Gates) Corliss Skains, MD (Interventional Neuroradiologist), Faith Rogue, MD (Physical Medicine & Rehabtilitation)  Patient's PCP:  Marisue Ivan, MD  DISCHARGE DIAGNOSIS:  Principal Problem:   Acute ischemic right MCA stroke (HCC)  Due to severe irregular stenosis of RT MCA inf division mid M2 branch,and sig irregular stenosis of RT MCA mid M1 due to long seg Irregular ICAD plaque ,S/P primary stenting with achievement of TICI 3 reperfusion. Active Problems:   Hypertension   History of CVA (cerebrovascular accident)   Tobacco use   Middle cerebral artery stenosis, right   Hyperlipidemia   Cocaine abuse (HCC)   Groin hematoma, right   Past Medical History:  Diagnosis Date  . Depression   . History of CVA (cerebrovascular accident)   . Hypertension   . Stroke (HCC)    x 4  . Tobacco abuse    Past Surgical History:  Procedure Laterality Date  . IR CT HEAD LTD  05/25/2018  . IR INTRA CRAN STENT  05/25/2018  . kidney surgery Left 1983  . RADIOLOGY WITH ANESTHESIA N/A 05/25/2018   Procedure: RADIOLOGY WITH ANESTHESIA;  Surgeon: Julieanne Cotton, MD;  Location: [facility] OR;  Service: Radiology;  Laterality: N/A;  . TEE WITHOUT CARDIOVERSION N/A 03/27/2018   Procedure: TRANSESOPHAGEAL ECHOCARDIOGRAM (TEE);  Surgeon: Dalia Heading, MD;  Location: [facility] ORS;  Service: Cardiovascular;  Laterality: N/A;    Allergies as of 05/29/2018   No Known Allergies     Medication List    STOP taking these medications   aspirin 325 MG EC tablet Replaced by:  aspirin 81 MG chewable tablet   clopidogrel 75 MG tablet Commonly known as:  PLAVIX     TAKE these medications   aspirin 81 MG chewable tablet Chew 1 tablet (81 mg total) by mouth  daily. Start taking on:  05/30/2018 Replaces:  aspirin 325 MG EC tablet   atorvastatin 10 MG tablet Commonly known as:  LIPITOR Take 1 tablet (10 mg total) by mouth daily at 6 PM. What changed:  how much to take   gabapentin 100 MG capsule Commonly known as:  NEURONTIN Take 1 capsule (100 mg total) by mouth at bedtime.   losartan 25 MG tablet Commonly known as:  COZAAR Take 1 tablet (25 mg total) by mouth daily.   ticagrelor 90 MG Tabs tablet Commonly known as:  BRILINTA Take 1 tablet (90 mg total) by mouth 2 (two) times daily.       LABORATORY STUDIES CBC    Component Value Date/Time   WBC 7.7 05/29/2018 1007   RBC 5.71 05/29/2018 1007   HGB 15.5 05/29/2018 1007   HGB 14.8 09/21/2014 0408   HCT 48.8 05/29/2018 1007   HCT 45.9 09/21/2014 0408   PLT 414 (H) 05/29/2018 1007   PLT 306 09/21/2014 0408   MCV 85.5 05/29/2018 1007   MCV 87 09/21/2014 0408   MCH 27.1 05/29/2018 1007   MCHC 31.8 05/29/2018 1007   RDW 15.2 05/29/2018 1007   RDW 14.9 (H) 09/21/2014 0408   LYMPHSABS 1.2 05/26/2018 0600   LYMPHSABS 0.2 (L) 09/21/2014 0408   MONOABS 0.7 05/26/2018 0600   MONOABS 0.3 09/21/2014 0408   EOSABS 0.3 05/26/2018 0600   EOSABS 0.0  09/21/2014 0408   BASOSABS 0.1 05/26/2018 0600   BASOSABS 0.0 09/21/2014 0408   CMP    Component Value Date/Time   NA 139 05/26/2018 0600   NA 143 09/21/2014 0408   K 3.5 05/26/2018 0600   K 3.4 (L) 09/21/2014 0408   CL 110 05/26/2018 0600   CL 110 (H) 09/21/2014 0408   CO2 23 05/26/2018 0600   CO2 24 09/21/2014 0408   GLUCOSE 79 05/26/2018 0600   GLUCOSE 131 (H) 09/21/2014 0408   BUN <5 (L) 05/26/2018 0600   BUN 11 09/21/2014 0408   CREATININE 1.03 05/26/2018 0600   CREATININE 1.22 09/21/2014 0408   CALCIUM 8.3 (L) 05/26/2018 0600   CALCIUM 8.5 09/21/2014 0408   PROT 6.6 05/25/2018 0124   PROT 7.2 09/20/2014 0543   ALBUMIN 4.0 05/25/2018 0124   ALBUMIN 3.7 09/20/2014 0543   AST 13 (L) 05/25/2018 0124   AST 25 09/20/2014  0543   ALT 12 (L) 05/25/2018 0124   ALT 22 09/20/2014 0543   ALKPHOS 55 05/25/2018 0124   ALKPHOS 76 09/20/2014 0543   BILITOT 0.6 05/25/2018 0124   BILITOT 0.4 09/20/2014 0543   GFRNONAA >60 05/26/2018 0600   GFRNONAA >60 09/21/2014 0408   GFRNONAA >60 03/09/2014 1430   GFRAA >60 05/26/2018 0600   GFRAA >60 09/21/2014 0408   GFRAA >60 03/09/2014 1430   COAGS Lab Results  Component Value Date   INR 1.08 05/25/2018   INR 0.98 09/02/2017   INR 0.9 03/09/2014   Lipid Panel    Component Value Date/Time   CHOL 125 05/25/2018 0915   CHOL 150 09/21/2014 0408   TRIG 54 05/25/2018 0915   TRIG 40 09/21/2014 0408   HDL 52 05/25/2018 0915   HDL 76 (H) 09/21/2014 0408   CHOLHDL 2.4 05/25/2018 0915   VLDL 11 05/25/2018 0915   VLDL 8 09/21/2014 0408   LDLCALC 62 05/25/2018 0915   LDLCALC 66 09/21/2014 0408   HgbA1C  Lab Results  Component Value Date   HGBA1C 5.6 05/25/2018   Urinalysis    Component Value Date/Time   COLORURINE YELLOW 05/25/2018 0601   APPEARANCEUR CLEAR 05/25/2018 0601   APPEARANCEUR Clear 03/09/2014 1955   LABSPEC 1.014 05/25/2018 0601   LABSPEC 1.013 03/09/2014 1955   PHURINE 5.0 05/25/2018 0601   GLUCOSEU NEGATIVE 05/25/2018 0601   GLUCOSEU Negative 03/09/2014 1955   HGBUR SMALL (A) 05/25/2018 0601   BILIRUBINUR NEGATIVE 05/25/2018 0601   BILIRUBINUR Negative 03/09/2014 1955   KETONESUR 5 (A) 05/25/2018 0601   PROTEINUR NEGATIVE 05/25/2018 0601   NITRITE NEGATIVE 05/25/2018 0601   LEUKOCYTESUR NEGATIVE 05/25/2018 0601   LEUKOCYTESUR Negative 03/09/2014 1955   Urine Drug Screen     Component Value Date/Time   LABOPIA NONE DETECTED 05/25/2018 0600   COCAINSCRNUR POSITIVE (A) 05/25/2018 0600   LABBENZ NONE DETECTED 05/25/2018 0600   AMPHETMU NONE DETECTED 05/25/2018 0600   THCU NONE DETECTED 05/25/2018 0600   LABBARB (A) 05/25/2018 0600    Result not available. Reagent lot number recalled by manufacturer.    Alcohol Level    Component Value  Date/Time   ETH <10 05/25/2018 0124     SIGNIFICANT DIAGNOSTIC STUDIES Ct Head Code Stroke Wo Contrast 05/25/2018 1. No acute intracranial process.  2. ASPECTS is 10.  3. Old small pontine, basal ganglia and thalamus infarcts. Moderate to severe chronic small vessel ischemic changes.  4. Old LEFT insular/parietal lobe, MCA territory infarct.  5. Moderate parenchymal brain volume loss.  CTA HEAD:  05/25/2018 1. Mild segment RIGHT M1 severe stenosis, given prior image this suggest pre-existing stenosis propagation, possible acute component.  2. Occluded distal azygos ACA with poor collateralization.  3. Tandem severe stenosis versus focally occluded LEFT posterior cerebral artery.  Occluded RIGHT P3 segment.   CTA NECK:  05/25/2018 1. No hemodynamically significant stenosis carotid arteries. Patent vertebral arteries.   CT PERFUSION:  05/25/2018 1. Small RIGHT parietal lobe infarct with confluent RIGHT frontoparietal/PCA and posterior watershed penumbra. Aortic Atherosclerosis (ICD10-I70.0).   IR Cerebral Angiogram and Intervention - Dr Corliss Skainseveshwar 05/25/2018 S/P rt common carotid arteriogram RT CFA approach. FIndings  1.Severe irregular stenosis of RT MCA inf division mid M2 branch,and sig irregular stenosis of RT MCA mid M1 due to long seg Irregular ICAD plaque ,S/P primary stenting with achievement of TICI 3 reperfusion.  [name]  05/25/2018 1. Scattered small foci of acute/early subacute infarction in right posterior frontal lobe, right superior/medial parietal lobe, right posterior cingulate gyrus, and right splenium of corpus callosum. 2. Additional punctate acute/early subacute infarctions in the right posterior temporal lobe, left superior cerebellum, left posterior limb of internal capsule. 3. No hemorrhage or significant mass effect. 4. Stable background of chronic infarctions, advanced chronic microvascular ischemic changes, and parenchymal volume loss  of the brain.  [facility] 03/27/2018 - Left ventricle: Systolic function was normal. The estimated ejection fraction was in the range of 50% to 55%. - Aortic valve: No evidence of vegetation. - Mitral valve: No evidence of vegetation. - Left atrium: No evidence of thrombus in the atrial cavity orappendage. - Right atrium: No evidence of thrombus in the atrial cavity orappendage. - Atrial septum: No defect or patent foramen ovale was identified.Echo contrast study showed no right-to-left atrial level shunt,at baseline or with provocation. Impressions:  No cardiac source of emboli was indentified.     HISTORY OF PRESENT ILLNESS [facility] a 57 y.o.malewho states that he started feeling abnormal around 4:30 pm 05/24/2018. He was still doing his activities and moving well until around 8:45 pm on 05/1418. At that time, he developed left sided weakness and EMS wascalled. A code stroke was activated and he arrived around 01:15a on 05/25/18. Premorbid modified rankin scale:1  On arrival, the patient had an NIH of 16 with severe left hemiparesis, right-sided gaze, left hemianopia. This did improve after CT perfusion scan to an NIH of 8, but he still had significant deficits and then he began to wax and wane.Given the severity of his deficits and the fact that he had already failed aspirin/Plavix/atorvastatin for his intracranial stenosis, he was taken for emergent intracranial angiography with consideration of intervention. He was not a tpa candidate as he was out of the window.  Cerebral angiogram demonstrated an severe stenosis of the right MCA, M1 and M2.  Right MCA stented with resultant TICI 3 reperfusion.  He was admitted to the neuro ICU for further evaluation and treatment.   HOSPITAL COURSE Cory Harvey is a 57 y.o. male with history of previous stroke, tobacco use, hypertension, and depression presenting with left-sided weakness. s/p TICI 3 reperfusion of right MCA  following acute stent placement.  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 - multiple old infarcts  CTA H&N - RIGHT M1 severe stenosis. Occluded distal azygos ACA. Occluded RIGHT P3 segment. Tandem severe stenosis versus focally occluded LEFT posterior cerebral artery.  MRI head - multiple bilateral infarcts  TEE - 03/27/2018 - EF 50 - 55%. No cardiac source of emboli identified.  LDL - 62  HgbA1c - 5.6  aspirin 325 mg daily and clopidogrel 75 mg daily ordered prior to admission but patient not compliant in taking because "I didn't have", now on aspirin 81 mg daily and Brilinta due to new R ICA stent  Therapy recommendations:  CIR but pt prefers SNF  Disposition:  SNF  Hypertension  120-140s off BP meds in hospital  Home meds: cozaar 25, resumed  Long-term BP goal normotensive  Hyperlipidemia  Lipid lowering medication PTA:  Lipitor 20 mg daily  LDL 62, goal < 70  Lipitor 10 mg daily resumed  Continue statin at discharge  Other Stroke Risk Factors  Cigarette smoker - advised to stop smoking  UDS - positive for cocaine. Advised to stop using  ETOH use, advised to drink no more than 2 alcoholic beverages per day.  Hx stroke/TIA ? 03/2018 -  Mult infarcts R cerebral hemisphere, posterior watershed distribution  - TEE neg - asa & plavix - CIR ? 08/2017 - multiple small L PCA territory acute infarcts - embolic - plavix added ? 03/2014 - L corpus callosum/gyrus/Lparietal lobe - failed plavix in past. Unable to afford Aggrenox. Continue asa. ? Prior to 06/2012 - 3 stroke episodes, last one 3 years prior without  residual sx  Family hx stroke (mother)  Hematoma R groin  At site of neuro intervention puncture  R groin soft with dsg remains CDI  Elevated HGB  Smoker   17.3 -> 15.5   DISCHARGE EXAM Blood pressure 121/81, pulse 60, temperature 98.5 F (36.9 C), temperature source Oral, resp. rate 16, height 5\' 9"  (1.753 m), weight 80 kg (176 lb 5.9 oz), SpO2 99 %. GENERAL: elderly appearing AA male, lying in bed, alert, in no acute distress HEENT: Normal EXTREMITIES: No edema  SKIN: warm and dry MENTAL STATUS: He is awake and alert, oriented to person and place.  He has a  modertae dysarthria.  CRANIAL NERVES: Pupils are equal, round and reactive to light and accomodation; extra ocular movements are full, there is no significant nystagmus; visual fields are full; upper and lower facial muscles are normal in strength and symmetric, there is no flattening of the nasolabial folds; tongue is midline; uvula is midline; shoulder elevation is normal. MOTOR: mild right hemiparesisl with  the right leg is 4/5 and the right upper extremity 4/5.  Left leg 5 and left upper extremity 2/5. COORDINATION: No clear dysmetria or tremors noted or ataxia.  Discharge Diet   Regular thin liquids  DISCHARGE PLAN  Disposition:  skilled nursing facility for ongoing PT, OT and ST.   aspirin 81 mg daily and Brilinta 90 mg bid for secondary stroke prevention.  Ongoing risk factor control by Primary Care Physician at time of discharge  Follow-up Marisue Ivan, MD in 2 weeks.  Follow-up in [facility] in 4 weeks, office to schedule an appointment.   40 minutes were spent preparing discharge.  Annie Main, MSN, APRN, ANVP-BC, AGPCNP-BC Advanced Practice Stroke Nurse [facility] See Amion for Schedule & Pager information 05/29/2018 12:36 PM  I have personally examined this patient, reviewed notes, independently viewed imaging  studies, participated in medical  decision making and plan of care.ROS completed by me personally and pertinent positives fully documented  I have made any additions or clarifications directly to the above note. Agree with note above.   Delia Heady, MD Medical Director [facility] Pager: [phone] 05/29/2018 12:51 PM

## 2018-05-29 NOTE — Progress Notes (Signed)
Occupational Therapy Treatment Patient Details Name: Cory Harvey MRN: [id number] DOB: [birth year] Today's Date: 05/29/2018    History of present illness Cory Harvey is a 57 y.o. 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  He lives alone.  He  Presented to hospital 05/25/2018 with increasing left-sided weakness as well as right-sided gaze.  Urine drug screen positive cocaine.  Cranial CT scan showed no acute process.  Patient did not receive TPA..   CT perfusion scan showed small right parietal lobe infarct with confluent right frontoparietal/PCA and posterior watershed penumbra.  Patient continued to wax and wane.  Interventional radiology cerebral angiogram and underwent stenting right MCA inferior division M2 branch.      OT comments  Pt progressing towards OT goals, presents supine in bed agreeable to tx session. Pt completing room level functional mobility without AD with overall minA-minguard assist. Pt completed standing grooming ADLs at sink with minguard for static standing balance, min cues for attending to L side of body during task completion. Pt attempting to incorporate LUE into functional tasks, requires increased time and effort to do so due to continued fine motor/coordination deficits. Noted per chart review pt preferring to go to SNF vs CIR at time of discharge, dispo recommendations have been updated to reflect; feel SNF recommendation is appropriate to progress pt towards PLOF prior to return home. Will continue to follow acutely to progress pt towards established OT goals.     Follow Up Recommendations  SNF;Supervision/Assistance - 24 hour    Equipment Recommendations  None recommended by OT          Precautions / Restrictions Precautions Precautions: Fall Precaution Comments: L side  inattention Restrictions Weight Bearing Restrictions: No       Mobility Bed Mobility Overal bed mobility: Needs Assistance Bed Mobility: Supine to Sit     Supine to sit: Min guard     General bed mobility comments: minguard for safety; cues to scoot to EOB   Transfers Overall transfer level: Needs assistance Equipment used: None Transfers: Sit to/from Stand Sit to Stand: Min assist;Min guard         General transfer comment: pt stood x3 during session, minA initially for first trial; close minguard for safety during remaining 2 trials; stood from EOB x2 and recliner x1    Balance Overall balance assessment: Needs assistance Sitting-balance support: Feet supported Sitting balance-Leahy Scale: Fair     Standing balance support: No upper extremity supported;During functional activity Standing balance-Leahy Scale: Fair Standing balance comment: Pt able to maintain static standing with close min guard assist                            ADL either performed or assessed with clinical judgement   ADL Overall ADL's : Needs assistance/impaired     Grooming: Wash/dry face;Oral care;Standing;Min guard Grooming Details (indicate cue type and reason): standing at sink                Lower Body Dressing Details (indicate cue type and reason): pt adjusting socks on his feet while seated EOB              Functional mobility during ADLs: Minimal assistance;Min guard General ADL Comments: pt completing room level functional mobility within room with overall minguard-minA; stood at sink for grooming  ADLs with minguard assist, requires intermittent cues for attending to L side; pt attempting to incorporate LUE into functional task but continues to have difficulty doing so      Vision   Additional Comments: pt continues to demonstrate L side inattention; demonstrates ability to locate items on L side of sink without assist during grooming ADLs using compensatory  methods, requires cues to wipe L side of face after completing oral care    Perception     Praxis      Cognition Arousal/Alertness: Awake/alert Behavior During Therapy: Flat affect Overall Cognitive Status: Impaired/Different from baseline Area of Impairment: Attention;Following commands;Problem solving;Awareness;Safety/judgement                   Current Attention Level: Selective Memory: Decreased short-term memory Following Commands: Follows one step commands consistently;Follows multi-step commands inconsistently Safety/Judgement: Decreased awareness of safety Awareness: Intellectual Problem Solving: Slow processing;Difficulty sequencing;Requires verbal cues;Requires tactile cues General Comments: L side inattention; pt reporting needing to urinate x2 during session, though currently with condom cath and requiring reminders of this                          Pertinent Vitals/ Pain       Pain Assessment: No/denies pain  Home Living                                          Prior Functioning/Environment              Frequency  Min 2X/week        Progress Toward Goals  OT Goals(current goals can now be found in the care plan section)  Progress towards OT goals: Progressing toward goals  Acute Rehab OT Goals Patient Stated Goal: to be able to cook supper and clean my house  OT Goal Formulation: With patient Time For Goal Achievement: 06/10/18 Potential to Achieve Goals: Good  Plan Discharge plan needs to be updated    Co-evaluation                 AM-PAC PT "6 Clicks" Daily Activity     Outcome Measure   Help from another person eating meals?: A Little Help from another person taking care of personal grooming?: A Little Help from another person toileting, which includes using toliet, bedpan, or urinal?: A Lot Help from another person bathing (including washing, rinsing, drying)?: A Lot Help from another person to put  on and taking off regular upper body clothing?: A Little Help from another person to put on and taking off regular lower body clothing?: A Lot 6 Click Score: 15    End of Session Equipment Utilized During Treatment: Gait belt  OT Visit Diagnosis: Unsteadiness on feet (R26.81);Low vision, both eyes (H54.2);Cognitive communication deficit (R41.841);Hemiplegia and hemiparesis Symptoms and signs involving cognitive functions: Cerebral infarction Hemiplegia - Right/Left: Left Hemiplegia - dominant/non-dominant: Dominant   Activity Tolerance Patient tolerated treatment well   Patient Left in chair;with call bell/phone within reach;with chair alarm set   Nurse Communication Mobility status        Time: [id number] OT Time Calculation (min): 32 min  Charges: OT General Charges $OT Visit: 1 Visit OT Treatments $Self Care/Home Management : 23-37 mins  Marcy SirenBreanna Dimitrious Micciche, OT Pager [phone] 05/29/2018    Orlando PennerBreanna L Ziquan Fidel 05/29/2018, 12:06 PM

## 2018-05-29 NOTE — Care Management Important Message (Signed)
Important Message  Patient Details  Name: Cory Harvey MRN: [id number] Date of Birth: 1961-11-28   Medicare Important Message Given:  Yes    Cory Harvey P Cory Harvey 05/29/2018, 3:22 PM

## 2018-05-29 NOTE — Care Management Note (Signed)
Case Management Note  Patient Details  Name: Cory Harvey MRN: [id number] Date of Birth: October 19, 1961  Subjective/Objective:                    Action/Plan: Pt discharging to [facility]. CM signing off.   Expected Discharge Date:  05/29/18               Expected Discharge Plan:  IP Rehab Facility  In-House Referral:  Clinical Social Work  Discharge planning Services  CM Consult  Post Acute Care Choice:    Choice offered to:     DME Arranged:    DME Agency:     HH Arranged:    HH Agency:     Status of Service:  Completed, signed off  If discussed at [employer] Meetings, dates discussed:    Additional Comments:  Kermit BaloKelli F Ko Bardon, RN 05/29/2018, 2:19 PM

## 2018-05-29 NOTE — Clinical Social Work Note (Signed)
Clinical Social Work Assessment  Patient Details  Name: Cory Harvey MRN: [id number] Date of Birth: [birth year]  Date of referral:  05/28/18               Reason for consult:  Facility Placement                Permission sought to share information with:  Facility Art therapist granted to share information::  Yes, Verbal Permission Granted  Name::        Agency::  SNF  Relationship::     Contact Information:     Housing/Transportation Living arrangements for the past 2 months:  Single Family Home Source of Information:  Patient Patient Interpreter Needed:  None Criminal Activity/Legal Involvement Pertinent to Current Situation/Hospitalization:  No - Comment as needed Significant Relationships:    Lives with:  Self Do you feel safe going back to the place where you live?  Yes Need for family participation in patient care:  No (Coment)  Care giving concerns:  Patient from home alone and will need short term rehab at discharge.   Social Worker assessment / plan:  CSW met with patient to discuss plan for SNF admission. CSW received permission to fax out referral and will follow up with bed offers.  Employment status:    Insurance informationEducational psychologist PT Recommendations:  Inpatient Rehab Consult Information / Referral to community resources:  [place]  Patient/Family's Response to care:  Patient agreeable to SNF.  Patient/Family's Understanding of and Emotional Response to Diagnosis, Current Treatment, and Prognosis:  Patient discussed how he didn't want to pay the CIR copays so he wanted SNF in [place]. Patient said he didn't care where, just that he wanted to go.  Emotional Assessment Appearance:  Appears stated age Attitude/Demeanor/Rapport:  Engaged Affect (typically observed):  Pleasant Orientation:  Oriented to Self, Oriented to Place, Oriented to  Time, Oriented to Situation Alcohol / Substance use:  Illicit  Drugs Psych involvement (Current and /or in the community):  No (Comment)  Discharge Needs  Concerns to be addressed:  Care Coordination Readmission within the last 30 days:  No Current discharge risk:  Dependent with Mobility, Lives alone Barriers to Discharge:  Ship broker, Continued Medical Work up, Programmer, applications ([place])   Geralynn Ochs, LCSW 05/29/2018, 8:57 AM

## 2018-06-25 ENCOUNTER — Ambulatory Visit: Payer: Medicare Other | Admitting: Neurology

## 2018-06-26 ENCOUNTER — Encounter: Payer: Self-pay | Admitting: Neurology

## 2018-06-27 ENCOUNTER — Telehealth: Payer: Self-pay

## 2018-06-27 NOTE — Telephone Encounter (Signed)
Patient no show for appointment on 06/25/2018.

## 2018-07-04 ENCOUNTER — Emergency Department: Payer: Medicare Other

## 2018-07-04 ENCOUNTER — Encounter: Payer: Self-pay | Admitting: Emergency Medicine

## 2018-07-04 ENCOUNTER — Emergency Department
Admission: EM | Admit: 2018-07-04 | Discharge: 2018-07-04 | Disposition: A | Discharge status: Home / Self Care | Payer: Medicare Other | Attending: Emergency Medicine | Admitting: Emergency Medicine

## 2018-07-04 DIAGNOSIS — Z7902 Long term (current) use of antithrombotics/antiplatelets: Secondary | ICD-10-CM | POA: Insufficient documentation

## 2018-07-04 DIAGNOSIS — M79641 Pain in right hand: Secondary | ICD-10-CM | POA: Insufficient documentation

## 2018-07-04 DIAGNOSIS — Z7982 Long term (current) use of aspirin: Secondary | ICD-10-CM | POA: Insufficient documentation

## 2018-07-04 DIAGNOSIS — F17210 Nicotine dependence, cigarettes, uncomplicated: Secondary | ICD-10-CM | POA: Diagnosis not present

## 2018-07-04 DIAGNOSIS — F1910 Other psychoactive substance abuse, uncomplicated: Secondary | ICD-10-CM | POA: Insufficient documentation

## 2018-07-04 DIAGNOSIS — Z8673 Personal history of transient ischemic attack (TIA), and cerebral infarction without residual deficits: Secondary | ICD-10-CM | POA: Diagnosis not present

## 2018-07-04 DIAGNOSIS — Z79899 Other long term (current) drug therapy: Secondary | ICD-10-CM | POA: Diagnosis not present

## 2018-07-04 DIAGNOSIS — I10 Essential (primary) hypertension: Secondary | ICD-10-CM | POA: Insufficient documentation

## 2018-07-04 LAB — COMPREHENSIVE METABOLIC PANEL
ALT: 16 U/L (ref 0–44)
AST: 17 U/L (ref 15–41)
Albumin: 4.1 g/dL (ref 3.5–5.0)
Alkaline Phosphatase: 59 U/L (ref 38–126)
Anion gap: 7 (ref 5–15)
BUN: 12 mg/dL (ref 6–20)
CHLORIDE: 109 mmol/L (ref 98–111)
CO2: 25 mmol/L (ref 22–32)
Calcium: 9.5 mg/dL (ref 8.9–10.3)
Creatinine, Ser: 1.16 mg/dL (ref 0.61–1.24)
Glucose, Bld: 99 mg/dL (ref 70–99)
POTASSIUM: 3.7 mmol/L (ref 3.5–5.1)
Sodium: 141 mmol/L (ref 135–145)
Total Bilirubin: 1 mg/dL (ref 0.3–1.2)
Total Protein: 6.9 g/dL (ref 6.5–8.1)

## 2018-07-04 LAB — CBC
HCT: 45.4 % (ref 40.0–52.0)
Hemoglobin: 15.5 g/dL (ref 13.0–18.0)
MCH: 28.9 pg (ref 26.0–34.0)
MCHC: 34.1 g/dL (ref 32.0–36.0)
MCV: 84.7 fL (ref 80.0–100.0)
PLATELETS: 456 10*3/uL — AB (ref 150–440)
RBC: 5.36 MIL/uL (ref 4.40–5.90)
RDW: 15 % — AB (ref 11.5–14.5)
WBC: 6.7 10*3/uL (ref 3.8–10.6)

## 2018-07-04 LAB — DIFFERENTIAL
BASOS PCT: 1 %
Basophils Absolute: 0.1 10*3/uL (ref 0–0.1)
EOS ABS: 0.3 10*3/uL (ref 0–0.7)
Eosinophils Relative: 4 %
Lymphocytes Relative: 14 %
Lymphs Abs: 0.9 10*3/uL — ABNORMAL LOW (ref 1.0–3.6)
MONO ABS: 0.5 10*3/uL (ref 0.2–1.0)
MONOS PCT: 7 %
Neutro Abs: 4.9 10*3/uL (ref 1.4–6.5)
Neutrophils Relative %: 74 %

## 2018-07-04 LAB — PROTIME-INR
INR: 1.13
PROTHROMBIN TIME: 14.4 s (ref 11.4–15.2)

## 2018-07-04 LAB — APTT: aPTT: 31 seconds (ref 24–36)

## 2018-07-04 LAB — TROPONIN I

## 2018-07-04 MED ORDER — GABAPENTIN 300 MG PO CAPS: 300.0000 mg | ORAL_CAPSULE | Freq: Two times a day (BID) | ORAL | 0 refills | Status: DC

## 2018-07-04 NOTE — ED Notes (Signed)
Pt returned from MRI and placed back on monitor. Pt denies complaints at this time and requests food. VSS. Call bell within reach, will continue to monitor.

## 2018-07-04 NOTE — ED Provider Notes (Signed)
[facility] Emergency Department Provider Note  ____________________________________________  Time seen: Approximately 2:07 PM  I have reviewed the triage vital signs and the nursing notes.   HISTORY  Chief Complaint Aphasia    HPI Cory Harvey is a 57 y.o. male with a history of CVA, hypertension, tobacco and cocaine abuse.  He was in his usual state of health when about 6 PM yesterday he started having pain in his right hand.  He reports that this right hand pain is similar to what happened before he had his last stroke.  He denies any acute weakness paresthesias or numbness.  No vision changes.  No headache or neck pain.  Denies any trauma.  No fevers chills or chest pain.  Symptoms have been constant, slightly improving but still present on waking up and throughout today.  Compliant with his medications.  Still uses tobacco and cocaine intermittently.      Past Medical History:  Diagnosis Date  . Depression   . History of CVA (cerebrovascular accident)   . Hypertension   . Stroke (HCC)    x 4  . Tobacco abuse      Patient Active Problem List   Diagnosis Date Noted  . Hyperlipidemia 05/29/2018  . Cocaine abuse (HCC) 05/29/2018  . Groin hematoma, right 05/29/2018  . Acute ischemic right MCA stroke [facility]) s/p neurointervention w/ R ICA stent placed 05/25/2018  . Middle cerebral artery stenosis, right 05/25/2018  . Cerebrovascular accident (CVA) involving right cerebral hemisphere (HCC) 03/29/2018  . CVA (cerebral vascular accident) (HCC) 03/26/2018  . Acute CVA (cerebrovascular accident) (HCC) 09/02/2017  . Gait disturbance 09/02/2017  . History of CVA (cerebrovascular accident) 04/18/2017  . Tobacco use 04/18/2017  . Depression   . Hypertension   . Stroke [facility])      Past Surgical History:  Procedure Laterality Date  . IR CT HEAD LTD  05/25/2018  . IR INTRA CRAN STENT  05/25/2018  . kidney surgery Left 1983  . RADIOLOGY WITH  ANESTHESIA N/A 05/25/2018   Procedure: RADIOLOGY WITH ANESTHESIA;  Surgeon: Julieanne Cotton, MD;  Location: [facility] OR;  Service: Radiology;  Laterality: N/A;  . TEE WITHOUT CARDIOVERSION N/A 03/27/2018   Procedure: TRANSESOPHAGEAL ECHOCARDIOGRAM (TEE);  Surgeon: Dalia Heading, MD;  Location: [facility] ORS;  Service: Cardiovascular;  Laterality: N/A;     Prior to Admission medications   Medication Sig Start Date End Date Taking? Authorizing Provider  aspirin 81 MG chewable tablet Chew 1 tablet (81 mg total) by mouth daily. 05/30/18  Yes Layne Benton, NP  atorvastatin (LIPITOR) 20 MG tablet Take 20 mg by mouth at bedtime. 06/18/18  Yes [provider]  clopidogrel (PLAVIX) 75 MG tablet Take 75 mg by mouth daily. 06/18/18  Yes [provider]  losartan (COZAAR) 25 MG tablet Take 1 tablet (25 mg total) by mouth daily. 04/04/18  Yes Love, Evlyn Kanner, PA-C  ticagrelor (BRILINTA) 90 MG TABS tablet Take 1 tablet (90 mg total) by mouth 2 (two) times daily. 05/29/18  Yes Layne Benton, NP  gabapentin (NEURONTIN) 300 MG capsule Take 1 capsule (300 mg total) by mouth 2 (two) times daily. 07/04/18 08/03/18  Sharman Cheek, MD     Allergies Patient has no known allergies.   Family History  Problem Relation Age of Onset  . Diabetes Mother   . Heart disease Mother   . Hyperlipidemia Mother   . Hypertension Mother   . Stroke Mother   . Diabetes Father   .  Hypertension Father   . Cancer Neg Hx     Social History Social History   Tobacco Use  . Smoking status: Current Every Day Smoker    Packs/day: 0.50    Types: Cigarettes  . Smokeless tobacco: Never Used  Substance Use Topics  . Alcohol use: Yes    Comment: occasional   . Drug use: No    Review of Systems  Constitutional:   No fever or chills.  Cardiovascular:   No chest pain or syncope. Respiratory:   No dyspnea or cough. Gastrointestinal:   Negative for abdominal pain, vomiting and diarrhea.  Musculoskeletal:   Right  hand pain as above All other systems reviewed and are negative except as documented above in ROS and HPI.  ____________________________________________   PHYSICAL EXAM:  VITAL SIGNS: ED Triage Vitals [07/04/18 1034]  Enc Vitals Group     BP 115/84     Pulse Rate 71     Resp 18     Temp 98 F (36.7 C)     Temp Source Oral     SpO2 99 %     Weight 176 lb (79.8 kg)     Height 5\' 8"  (1.727 m)     Head Circumference      Peak Flow      Pain Score 0     Pain Loc      Pain Edu?      Excl. in GC?     Vital signs reviewed, nursing assessments reviewed.   Constitutional:   Alert and oriented. Non-toxic appearance. Eyes:   Conjunctivae are normal. EOMI. PERRL.  No nystagmus ENT      Head:   Normocephalic and atraumatic.      Nose:   No congestion/rhinnorhea.       Mouth/Throat:   MMM, no pharyngeal erythema. No peritonsillar mass.       Neck:   No meningismus. Full ROM. Hematological/Lymphatic/Immunilogical:   No cervical lymphadenopathy. Cardiovascular:   RRR. Symmetric bilateral radial and DP pulses.  No murmurs. Cap refill less than 2 seconds. Respiratory:   Normal respiratory effort without tachypnea/retractions. Breath sounds are clear and equal bilaterally. No wheezes/rales/rhonchi. Gastrointestinal:   Soft and nontender. Non distended. There is no CVA tenderness.  No rebound, rigidity, or guarding. Musculoskeletal:   Normal range of motion in all extremities. No joint effusions.  No lower extremity tenderness.  No edema. Neurologic:   Slight dysarthria, normal language Cranial nerves III through XII intact Normal cerebellar function.  Motor grossly 4/5 strength in all extremities, symmetric. No acute focal neurologic deficits are appreciated.   ____________________________________________    LABS (pertinent positives/negatives) (all labs ordered are listed, but only abnormal results are displayed) Labs Reviewed  CBC - Abnormal; Notable for the following components:       Result Value   RDW 15.0 (*)    Platelets 456 (*)    All other components within normal limits  DIFFERENTIAL - Abnormal; Notable for the following components:   Lymphs Abs 0.9 (*)    All other components within normal limits  PROTIME-INR  APTT  COMPREHENSIVE METABOLIC PANEL  TROPONIN I  CBG MONITORING, ED   ____________________________________________   EKG    ____________________________________________    RADIOLOGY  [name]  Result Date: 07/04/2018 CLINICAL DATA:  Right hand pain, left facial droop, and difficulty speaking. Prior strokes. EXAM: MRI HEAD WITHOUT CONTRAST TECHNIQUE: Multiplanar, multiecho pulse sequences of the brain and surrounding structures were obtained without intravenous  contrast. COMPARISON:  05/25/2018 FINDINGS: Brain: Trace diffusion signal abnormality associated with the multiple small infarcts on the prior MRI has largely resolved, with only minimal residual signal abnormality remaining in the posterior right frontal lobe which is likely due to susceptibility artifact from a small amount of old blood products associated with the prior infarcts in this location. Small areas of developing cortical encephalomalacia are present in the right frontal and parietal lobes. No acute infarct is identified. There is a background of extensive chronic small vessel ischemia throughout the cerebral white matter with multiple old infarcts again noted in the deep gray nuclei bilaterally, cerebral white matter, pons, and cerebellum. Extensive encephalomalacia is also again seen in the corpus callosum. Cerebral atrophy is moderately advanced for age. No mass, midline shift, or extra-axial fluid collection is identified. Vascular: Major intracranial vascular flow voids are preserved. Right MCA stent with associated susceptibility artifact. Skull and upper cervical spine: Unremarkable bone marrow signal. Sinuses/Orbits: Unremarkable orbits. Mild mucosal thickening  scattered in the paranasal sinuses. Clear mastoid air cells. Other: None. IMPRESSION: 1. No acute intracranial abnormality. 2. Extensive chronic small vessel ischemia. Electronically Signed   By: Sebastian AcheAllen  Grady M.D.   On: 07/04/2018 12:16    ____________________________________________   PROCEDURES Procedures  ____________________________________________  DIFFERENTIAL DIAGNOSIS   Right hand pain, ischemic stroke  CLINICAL IMPRESSION / ASSESSMENT AND PLAN / ED COURSE  Pertinent labs & imaging results that were available during my care of the patient were reviewed by me and considered in my medical decision making (see chart for details).      Clinical Course as of Jul 04 1406  Thu Jul 04, 2018  1034 Strength symmetric. Sx started 6pm yesterday. VAN  negative. Cancel code stroke. Will continue stroke worup.    [PS]  1051 D/w Dr. Thad Rangereynolds, agrees not interventino candidate. Rec MRI brain, if non acute could DC home to continue usual outpatient care. Believes CT head not necessary as well, will proceed to MRI.    [PS]  1053 CT notified to cancel CT head    [PS]  1352 MRI negative for acute process.  [name] [PS]    Clinical Course User Index [PS] Sharman CheekStafford, Marijayne Rauth, MD     ----------------------------------------- 2:12 PM on 07/04/2018 -----------------------------------------  Stable for discharge home and outpatient follow-up with primary care.  Increase gabapentin per neurology consult recommendations.  Continue all other home medications.  Presentation not consistent with stroke at this time.  Doubt intracranial hemorrhage.  No evidence of infectious process.  Doubt brachial plexus or C-spine dysfunction  ____________________________________________   FINAL CLINICAL IMPRESSION(S) / ED DIAGNOSES    Final diagnoses:  Right hand pain  Polysubstance abuse [facility](HCC)     ED Discharge Orders        Ordered    gabapentin (NEURONTIN) 300 MG capsule  2 times  daily     07/04/18 1407      Portions of this note were generated with dragon dictation software. Dictation errors may occur despite best attempts at proofreading.    Sharman CheekStafford, Edla Para, MD 07/04/18 [id number]

## 2018-07-04 NOTE — ED Triage Notes (Signed)
Arrives c/o right hand pain and difficulty speaking since yesterday at 1800.    MAE equally and strong.  Left facial droop noted.  Patient has history of stoke in the past.

## 2018-07-04 NOTE — ED Notes (Signed)
Neurologist at bedside. 

## 2018-07-04 NOTE — Progress Notes (Signed)
   07/04/18 1345  Clinical Encounter Type  Visited With Patient  Visit Type Follow-up;Spiritual support;ED  Referral From Nurse  Consult/Referral To Chaplain  Spiritual Encounters  Spiritual Needs Emotional   CH followed up on patient from a code stroke this A.M. Mr. Cory Harvey informed me that he would be staying over night and was waiting for a bed. I told the patient that I would check on him once he got to his room.

## 2018-07-04 NOTE — Progress Notes (Signed)
CODE STROKE- PHARMACY COMMUNICATION   Time CODE STROKE called/page received:1023  Time response to CODE STROKE was made (in person or via phone): 1155 Initial page received while on ICU rounds. Attempted to call RN without success initially.   Time Stroke Kit retrieved from [place] (only if needed):  Name of Provider/Nurse contacted:Nicole   Past Medical History:  Diagnosis Date  . Depression   . History of CVA (cerebrovascular accident)   . Hypertension   . Stroke ([place])    x 4  . Tobacco abuse    Prior to Admission medications   Medication Sig Start Date End Date Taking? Authorizing Provider  aspirin 81 MG chewable tablet Chew 1 tablet (81 mg total) by mouth daily. 05/30/18  Yes Donzetta Starch, NP  atorvastatin (LIPITOR) 20 MG tablet Take 20 mg by mouth at bedtime. 06/18/18  Yes [provider]  clopidogrel (PLAVIX) 75 MG tablet Take 75 mg by mouth daily. 06/18/18  Yes [provider]  losartan (COZAAR) 25 MG tablet Take 1 tablet (25 mg total) by mouth daily. 04/04/18  Yes Love, Ivan Anchors, PA-C  ticagrelor (BRILINTA) 90 MG TABS tablet Take 1 tablet (90 mg total) by mouth 2 (two) times daily. 05/29/18  Yes Donzetta Starch, NP  gabapentin (NEURONTIN) 100 MG capsule Take 1 capsule (100 mg total) by mouth at bedtime. Patient not taking: Reported on 07/04/2018 04/04/18   Bary Leriche, PA-C  atorvastatin (LIPITOR) 10 MG tablet Take 1 tablet (10 mg total) by mouth daily at 6 PM. 05/29/18 07/04/18  Donzetta Starch, NP    Napoleon Form ,PharmD Clinical Pharmacist  07/04/2018  11:55 AM

## 2018-07-04 NOTE — Consult Note (Signed)
Referring Physician: Scotty Court    Chief Complaint: Right hand pain  HPI: Cory Harvey is an 57 y.o. male with a history of polysubstance abuse and multiple infarcts in the past who presents reporting that on yesterday evening he began to experience right hand pain.  Started to feel a little better on going to bed but was still present.  Awakened today and continued to feel the right hand pain but also felt as he did in the past when he had a stroke and presented for evaluation.   Initial NIHSS of 4.    Date last known well: Date: 07/03/2018 Time last known well: Time: 18:00 tPA Given: No: Outside time window, recent infarct  Past Medical History:  Diagnosis Date  . Depression   . History of CVA (cerebrovascular accident)   . Hypertension   . Stroke (HCC)    x 4  . Tobacco abuse     Past Surgical History:  Procedure Laterality Date  . IR CT HEAD LTD  05/25/2018  . IR INTRA CRAN STENT  05/25/2018  . kidney surgery Left 1983  . RADIOLOGY WITH ANESTHESIA N/A 05/25/2018   Procedure: RADIOLOGY WITH ANESTHESIA;  Surgeon: Julieanne Cotton, MD;  Location: [facility];  Service: Radiology;  Laterality: N/A;  . TEE WITHOUT CARDIOVERSION N/A 03/27/2018   Procedure: TRANSESOPHAGEAL ECHOCARDIOGRAM (TEE);  Surgeon: Dalia Heading, MD;  Location: [facility] ORS;  Service: Cardiovascular;  Laterality: N/A;    Family History  Problem Relation Age of Onset  . Diabetes Mother   . Heart disease Mother   . Hyperlipidemia Mother   . Hypertension Mother   . Stroke Mother   . Diabetes Father   . Hypertension Father   . Cancer Neg Hx    Social History:  reports that he has been smoking cigarettes.  He has been smoking about 0.50 packs per day. He has never used smokeless tobacco. He reports that he drinks alcohol. He reports that he does not use drugs.  Allergies: No Known Allergies  Medications: I have reviewed the patient's current medications. Prior to Admission:  Prior to Admission medications    Medication Sig Start Date End Date Taking? Authorizing Provider  aspirin 81 MG chewable tablet Chew 1 tablet (81 mg total) by mouth daily. 05/30/18   Layne Benton, NP  atorvastatin (LIPITOR) 10 MG tablet Take 1 tablet (10 mg total) by mouth daily at 6 PM. 05/29/18   Layne Benton, NP  gabapentin (NEURONTIN) 100 MG capsule Take 1 capsule (100 mg total) by mouth at bedtime. 04/04/18   Love, Evlyn Kanner, PA-C  losartan (COZAAR) 25 MG tablet Take 1 tablet (25 mg total) by mouth daily. 04/04/18   Love, Evlyn Kanner, PA-C  ticagrelor (BRILINTA) 90 MG TABS tablet Take 1 tablet (90 mg total) by mouth 2 (two) times daily. 05/29/18   Layne Benton, NP    ROS: History obtained from the patient  General ROS: negative for - chills, fatigue, fever, night sweats, weight gain or weight loss Psychological ROS: negative for - behavioral disorder, hallucinations, memory difficulties, mood swings or suicidal ideation Ophthalmic ROS: negative for - blurry vision, double vision, eye pain or loss of vision ENT ROS: negative for - epistaxis, nasal discharge, oral lesions, sore throat, tinnitus or vertigo Allergy and Immunology ROS: negative for - hives or itchy/watery eyes Hematological and Lymphatic ROS: negative for - bleeding problems, bruising or swollen lymph nodes Endocrine ROS: negative for - galactorrhea, hair pattern changes, polydipsia/polyuria or temperature  intolerance Respiratory ROS: negative for - cough, hemoptysis, shortness of breath or wheezing Cardiovascular ROS: negative for - chest pain, dyspnea on exertion, edema or irregular heartbeat Gastrointestinal ROS: negative for - abdominal pain, diarrhea, hematemesis, nausea/vomiting or stool incontinence Genito-Urinary ROS: negative for - dysuria, hematuria, incontinence or urinary frequency/urgency Musculoskeletal ROS: as noted in HPI Neurological ROS: as noted in HPI Dermatological ROS: negative for rash and skin lesion changes  Physical  Examination: Blood pressure 115/84, pulse 71, temperature 98 F (36.7 C), temperature source Oral, resp. rate 18, height 5\' 8"  (1.727 m), weight 79.8 kg (176 lb), SpO2 99 %.  HEENT-  Normocephalic, no lesions, without obvious abnormality.  Normal external eye and conjunctiva.  Normal TM's bilaterally.  Normal auditory canals and external ears. Normal external nose, mucus membranes and septum.  Normal pharynx. Cardiovascular- S1, S2 normal, pulses palpable throughout   Lungs- chest clear, no wheezing, rales, normal symmetric air entry Abdomen- soft, non-tender; bowel sounds normal; no masses,  no organomegaly Extremities- no edema Lymph-no adenopathy palpable Musculoskeletal-no hand pain to palpation Skin-warm and dry, no hyperpigmentation, vitiligo, or suspicious lesions  Neurological Examination   Mental Status: Alert, oriented, thought content appropriate.  Speech fluent without evidence of aphasia.  Dysarthric.  Able to follow 3 step commands without difficulty. Cranial Nerves: II: Discs flat bilaterally; Visual fields grossly normal, pupils equal, round, reactive to light and accommodation III,IV, VI: ptosis not present, extra-ocular motions intact bilaterally V,VII: left facial droop, facial light touch sensation normal bilaterally VIII: hearing normal bilaterally IX,X: gag reflex present XI: bilateral shoulder shrug XII: midline tongue extension Motor: Right : Upper extremity   5/5    Left:     Upper extremity   5-/5  Lower extremity   5/5     Lower extremity   4+/5 Tone and bulk:normal tone throughout; no atrophy noted Sensory: Pinprick and light touch intact throughout, bilaterally Deep Tendon Reflexes: 2+ and symmetric throughout Plantars: Right: downgoing   Left: upgoing Cerebellar: Normal finger-to-nose and normal heel-to-shin testing bilaterally Gait: not tested due to safety concerns    Laboratory Studies:  Basic Metabolic Panel: No results for input(s): NA, K, CL,  CO2, GLUCOSE, BUN, CREATININE, CALCIUM, MG, PHOS in the last 168 hours.  Liver Function Tests: No results for input(s): AST, ALT, ALKPHOS, BILITOT, PROT, ALBUMIN in the last 168 hours. No results for input(s): LIPASE, AMYLASE in the last 168 hours. No results for input(s): AMMONIA in the last 168 hours.  CBC: No results for input(s): WBC, NEUTROABS, HGB, HCT, MCV, PLT in the last 168 hours.  Cardiac Enzymes: No results for input(s): CKTOTAL, CKMB, CKMBINDEX, TROPONINI in the last 168 hours.  BNP: Invalid input(s): POCBNP  CBG: No results for input(s): GLUCAP in the last 168 hours.  Microbiology: Results for orders placed or performed during the hospital encounter of 05/25/18  MRSA PCR Screening     Status: None   Collection Time: 05/26/18  6:00 AM  Result Value Ref Range Status   MRSA by PCR NEGATIVE NEGATIVE Final    Comment:        The GeneXpert MRSA Assay (FDA approved for NASAL specimens only), is one component of a comprehensive MRSA colonization surveillance program. It is not intended to diagnose MRSA infection nor to guide or monitor treatment for MRSA infections. Performed at [facility], [address], [place], [place] [address]     Coagulation Studies: No results for input(s): LABPROT, INR in the last 72 hours.  Urinalysis:  No results for input(s): COLORURINE, LABSPEC, PHURINE, GLUCOSEU, HGBUR, BILIRUBINUR, KETONESUR, PROTEINUR, UROBILINOGEN, NITRITE, LEUKOCYTESUR in the last 168 hours.  Invalid input(s): APPERANCEUR  Lipid Panel:    Component Value Date/Time   CHOL 125 05/25/2018 0915   CHOL 150 09/21/2014 0408   TRIG 54 05/25/2018 0915   TRIG 40 09/21/2014 0408   HDL 52 05/25/2018 0915   HDL 76 (H) 09/21/2014 0408   CHOLHDL 2.4 05/25/2018 0915   VLDL 11 05/25/2018 0915   VLDL 8 09/21/2014 0408   LDLCALC 62 05/25/2018 0915   LDLCALC 66 09/21/2014 0408    HgbA1C:  Lab Results  Component Value Date   HGBA1C 5.6 05/25/2018    Urine  Drug Screen:      Component Value Date/Time   LABOPIA NONE DETECTED 05/25/2018 0600   COCAINSCRNUR POSITIVE (A) 05/25/2018 0600   LABBENZ NONE DETECTED 05/25/2018 0600   AMPHETMU NONE DETECTED 05/25/2018 0600   THCU NONE DETECTED 05/25/2018 0600   LABBARB (A) 05/25/2018 0600    Result not available. Reagent lot number recalled by manufacturer.    Alcohol Level: No results for input(s): ETH in the last 168 hours.   Imaging: No results found.  Assessment: 57 y.o. male presenting with complaints of right hand and feeling like he did when he had a stroke in the past.  Other than the pain no new focal neurological complaints.  Patient not a tPA candidate.  On Brilinta, ASA and a statin.    Stroke Risk Factors - hypertension, smoking and polysubstance abuse  Plan: 1. NPO until RN stroke swallow screen 2. MRI of the brain without contrast.  If no evidence of acute infarct would increase Neurontin to 300mg  BID and patient may continue follow up on an outpatient basis 3. Prophylactic therapy-Continue ASA, statin and Brilinta 4. Telemetry monitoring 5. Frequent neuro checks   Case discussed with Dr. Claudius SisStafford  Sundeep Destin, MD Neurology [phone] 07/04/2018, 10:41 AM

## 2018-07-04 NOTE — ED Notes (Signed)
CODE STROKE CALLED TO 333 

## 2018-07-04 NOTE — Discharge Instructions (Addendum)
Your examination today was unremarkable without concerning stroke symptoms in the ED, and your MRI of the brain did not show any new strokes or other acute issues.  Follow-up with your primary care doctor for continued monitoring of your symptoms.  Increase gabapentin to 300 mg 2 times a day.  Continue your other medicines for ongoing stroke prevention.

## 2018-07-04 NOTE — ED Notes (Signed)
EDP clearing pt for CT

## 2018-07-04 NOTE — ED Notes (Signed)
Pt given po water and meal tray as requested, MD Scotty CourtStafford ok for pt to eat.

## 2018-07-04 NOTE — Progress Notes (Signed)
   07/04/18 1035  Clinical Encounter Type  Visited With Patient  Visit Type Initial;Spiritual support;ED  Referral From Nurse  Consult/Referral To Chaplain  Spiritual Encounters  Spiritual Needs Prayer;Emotional  Stress Factors  Patient Stress Factors Other (Comment)  Advance Directives (For Healthcare)  Does Patient Have a Medical Advance Directive? No  Mental Health Advance Directives  Does Patient Have a Mental Health Advance Directive? No   [facility] received a code stroke page for Mr Cory Harvey. The patient didn't appear to be in stress after returning from CT. Mr. Cory Harvey talked at length about his health and the multiple strokes he has had. He currently lives in a house owned by his mother. The patient's speech is limited due to both a stroke and a stutter. Patient was taken to MRI. I will follow up later.

## 2018-08-08 ENCOUNTER — Emergency Department: Payer: Medicare Other

## 2018-08-08 ENCOUNTER — Encounter: Payer: Self-pay | Admitting: Emergency Medicine

## 2018-08-08 ENCOUNTER — Other Ambulatory Visit: Payer: Self-pay

## 2018-08-08 ENCOUNTER — Inpatient Hospital Stay
Admission: EM | Admit: 2018-08-08 | Discharge: 2018-08-16 | DRG: 065 | Disposition: A | Discharge status: Skilled Nursing Facility | Payer: Medicare Other | Attending: Internal Medicine | Admitting: Internal Medicine

## 2018-08-08 DIAGNOSIS — I639 Cerebral infarction, unspecified: Secondary | ICD-10-CM

## 2018-08-08 DIAGNOSIS — Z7902 Long term (current) use of antithrombotics/antiplatelets: Secondary | ICD-10-CM

## 2018-08-08 DIAGNOSIS — E785 Hyperlipidemia, unspecified: Secondary | ICD-10-CM | POA: Diagnosis present

## 2018-08-08 DIAGNOSIS — Z5309 Procedure and treatment not carried out because of other contraindication: Secondary | ICD-10-CM

## 2018-08-08 DIAGNOSIS — F329 Major depressive disorder, single episode, unspecified: Secondary | ICD-10-CM | POA: Diagnosis present

## 2018-08-08 DIAGNOSIS — Z833 Family history of diabetes mellitus: Secondary | ICD-10-CM

## 2018-08-08 DIAGNOSIS — Z79899 Other long term (current) drug therapy: Secondary | ICD-10-CM

## 2018-08-08 DIAGNOSIS — Z8249 Family history of ischemic heart disease and other diseases of the circulatory system: Secondary | ICD-10-CM

## 2018-08-08 DIAGNOSIS — F1410 Cocaine abuse, uncomplicated: Secondary | ICD-10-CM | POA: Diagnosis present

## 2018-08-08 DIAGNOSIS — E876 Hypokalemia: Secondary | ICD-10-CM | POA: Diagnosis present

## 2018-08-08 DIAGNOSIS — Z7151 Drug abuse counseling and surveillance of drug abuser: Secondary | ICD-10-CM

## 2018-08-08 DIAGNOSIS — R52 Pain, unspecified: Secondary | ICD-10-CM

## 2018-08-08 DIAGNOSIS — R531 Weakness: Secondary | ICD-10-CM

## 2018-08-08 DIAGNOSIS — Z823 Family history of stroke: Secondary | ICD-10-CM

## 2018-08-08 DIAGNOSIS — S46001A Unspecified injury of muscle(s) and tendon(s) of the rotator cuff of right shoulder, initial encounter: Secondary | ICD-10-CM | POA: Diagnosis present

## 2018-08-08 DIAGNOSIS — Z8349 Family history of other endocrine, nutritional and metabolic diseases: Secondary | ICD-10-CM

## 2018-08-08 DIAGNOSIS — Z7982 Long term (current) use of aspirin: Secondary | ICD-10-CM

## 2018-08-08 DIAGNOSIS — G8194 Hemiplegia, unspecified affecting left nondominant side: Secondary | ICD-10-CM | POA: Diagnosis present

## 2018-08-08 DIAGNOSIS — I63511 Cerebral infarction due to unspecified occlusion or stenosis of right middle cerebral artery: Secondary | ICD-10-CM | POA: Diagnosis not present

## 2018-08-08 DIAGNOSIS — I10 Essential (primary) hypertension: Secondary | ICD-10-CM | POA: Diagnosis present

## 2018-08-08 DIAGNOSIS — R414 Neurologic neglect syndrome: Secondary | ICD-10-CM | POA: Diagnosis present

## 2018-08-08 DIAGNOSIS — X58XXXA Exposure to other specified factors, initial encounter: Secondary | ICD-10-CM | POA: Diagnosis present

## 2018-08-08 DIAGNOSIS — Z8673 Personal history of transient ischemic attack (TIA), and cerebral infarction without residual deficits: Secondary | ICD-10-CM

## 2018-08-08 DIAGNOSIS — F17210 Nicotine dependence, cigarettes, uncomplicated: Secondary | ICD-10-CM | POA: Diagnosis present

## 2018-08-08 LAB — URINALYSIS, COMPLETE (UACMP) WITH MICROSCOPIC
Bacteria, UA: NONE SEEN
Bilirubin Urine: NEGATIVE
Glucose, UA: NEGATIVE mg/dL
Hgb urine dipstick: NEGATIVE
KETONES UR: 20 mg/dL — AB
Leukocytes, UA: NEGATIVE
Nitrite: NEGATIVE
PH: 5 (ref 5.0–8.0)
Protein, ur: NEGATIVE mg/dL
SPECIFIC GRAVITY, URINE: 1.021 (ref 1.005–1.030)
Squamous Epithelial / LPF: NONE SEEN (ref 0–5)

## 2018-08-08 LAB — URINE DRUG SCREEN, QUALITATIVE (ARMC ONLY)
AMPHETAMINES, UR SCREEN: NOT DETECTED
Barbiturates, Ur Screen: NOT DETECTED
COCAINE METABOLITE, UR ~~LOC~~: POSITIVE — AB
Cannabinoid 50 Ng, Ur ~~LOC~~: NOT DETECTED
MDMA (Ecstasy)Ur Screen: NOT DETECTED
METHADONE SCREEN, URINE: NOT DETECTED
OPIATE, UR SCREEN: NOT DETECTED
Phencyclidine (PCP) Ur S: NOT DETECTED
Tricyclic, Ur Screen: NOT DETECTED

## 2018-08-08 LAB — COMPREHENSIVE METABOLIC PANEL
ALBUMIN: 4.2 g/dL (ref 3.5–5.0)
ALK PHOS: 51 U/L (ref 38–126)
ALT: 15 U/L (ref 0–44)
AST: 22 U/L (ref 15–41)
Anion gap: 8 (ref 5–15)
BUN: 10 mg/dL (ref 6–20)
CALCIUM: 9.4 mg/dL (ref 8.9–10.3)
CO2: 23 mmol/L (ref 22–32)
Chloride: 111 mmol/L (ref 98–111)
Creatinine, Ser: 1.13 mg/dL (ref 0.61–1.24)
GFR calc Af Amer: >60 mL/min (ref >60)
GFR calc non Af Amer: >60 mL/min (ref >60)
Glucose, Bld: 88 mg/dL (ref 70–99)
Potassium: 3.4 mmol/L — ABNORMAL LOW (ref 3.5–5.1)
Sodium: 142 mmol/L (ref 135–145)
TOTAL PROTEIN: 6.7 g/dL (ref 6.5–8.1)
Total Bilirubin: 1.1 mg/dL (ref 0.3–1.2)

## 2018-08-08 LAB — CBC WITH DIFFERENTIAL/PLATELET
BASOS PCT: 0 %
Basophils Absolute: 0 10*3/uL (ref 0–0.1)
EOS ABS: 0 10*3/uL (ref 0–0.7)
Eosinophils Relative: 0 %
HCT: 45.3 % (ref 40.0–52.0)
Hemoglobin: 15.2 g/dL (ref 13.0–18.0)
LYMPHS ABS: 0.4 10*3/uL — AB (ref 1.0–3.6)
Lymphocytes Relative: 4 %
MCH: 28.3 pg (ref 26.0–34.0)
MCHC: 33.6 g/dL (ref 32.0–36.0)
MCV: 84.1 fL (ref 80.0–100.0)
MONOS PCT: 7 %
Monocytes Absolute: 0.7 10*3/uL (ref 0.2–1.0)
NEUTROS PCT: 89 %
Neutro Abs: 9.4 10*3/uL — ABNORMAL HIGH (ref 1.4–6.5)
Platelets: 358 10*3/uL (ref 150–440)
RBC: 5.38 MIL/uL (ref 4.40–5.90)
RDW: 14.6 % — AB (ref 11.5–14.5)
WBC: 10.6 10*3/uL (ref 3.8–10.6)

## 2018-08-08 LAB — GLUCOSE, CAPILLARY: GLUCOSE-CAPILLARY: 83 mg/dL (ref 70–99)

## 2018-08-08 LAB — ETHANOL

## 2018-08-08 LAB — APTT: aPTT: 29 seconds (ref 24–36)

## 2018-08-08 LAB — PROTIME-INR
INR: 1.05
PROTHROMBIN TIME: 13.6 s (ref 11.4–15.2)

## 2018-08-08 NOTE — ED Notes (Signed)
Patient transported to MRI 

## 2018-08-08 NOTE — ED Triage Notes (Signed)
Pt presents to ED via [facility] with c/o generalized weakness from home. Per EMS pt has hx of CVA x 4 with residual L sided deficit. EMS reports that patient reported to them was feeling fatigued and weak, went to lay and cough and missed the couch was unable to get up. 98% on 2L via [place], 143/89, Hr 87, CBG 91. 18G to R forearm.

## 2018-08-08 NOTE — ED Provider Notes (Signed)
This patient's MRI head was signed out to me by Dr. Alphonzo LemmingsMcshane and it was not complete at the time of my signout to Dr. Alanda AmassJade Sultan.  She will follow-up with the results for final disposition.   Rockne MenghiniNorman, Anne-Caroline, MD 08/08/18 2303

## 2018-08-08 NOTE — ED Notes (Signed)
Unknown last known well, pt with R sided gaze deviation, loss of visual field on the L side.

## 2018-08-08 NOTE — ED Notes (Signed)
Pt returns from MRI. Resting comfortably.

## 2018-08-08 NOTE — ED Provider Notes (Addendum)
[facility] Emergency Department Provider Note  ____________________________________________   I have reviewed the triage vital signs and the nursing notes. Where available I have reviewed prior notes and, if possible and indicated, outside hospital notes.    HISTORY  Chief Complaint Weakness    HPI Cory Harvey is a 57 y.o. male who has a history of CVAs in the past x4 minutes hypertension, tobacco abuse, and cocaine abuse.  Cocaine seems to bring out the strokes.  Patient still uses cocaine.  He states he has not "today that I remember" used it.  In any event sometime earlier today he is not sure at all when the time of onset was he became vaguely weak in ways he cannot describe.  Long history of recurrent CVA in the context of ongoing cocaine abuse    Past Medical History:  Diagnosis Date  . Depression   . History of CVA (cerebrovascular accident)   . Hypertension   . Stroke (HCC)    x 4  . Tobacco abuse     Patient Active Problem List   Diagnosis Date Noted  . Hyperlipidemia 05/29/2018  . Cocaine abuse (HCC) 05/29/2018  . Groin hematoma, right 05/29/2018  . Acute ischemic right MCA stroke [facility]) s/p neurointervention w/ R ICA stent placed 05/25/2018  . Middle cerebral artery stenosis, right 05/25/2018  . Cerebrovascular accident (CVA) involving right cerebral hemisphere (HCC) 03/29/2018  . CVA (cerebral vascular accident) (HCC) 03/26/2018  . Acute CVA (cerebrovascular accident) (HCC) 09/02/2017  . Gait disturbance 09/02/2017  . History of CVA (cerebrovascular accident) 04/18/2017  . Tobacco use 04/18/2017  . Depression   . Hypertension   . Stroke [facility])     Past Surgical History:  Procedure Laterality Date  . IR CT HEAD LTD  05/25/2018  . IR INTRA CRAN STENT  05/25/2018  . kidney surgery Left 1983  . RADIOLOGY WITH ANESTHESIA N/A 05/25/2018   Procedure: RADIOLOGY WITH ANESTHESIA;  Surgeon: Julieanne Cotton, MD;  Location: [facility] OR;   Service: Radiology;  Laterality: N/A;  . TEE WITHOUT CARDIOVERSION N/A 03/27/2018   Procedure: TRANSESOPHAGEAL ECHOCARDIOGRAM (TEE);  Surgeon: Dalia Heading, MD;  Location: [facility] ORS;  Service: Cardiovascular;  Laterality: N/A;    Prior to Admission medications   Medication Sig Start Date End Date Taking? Authorizing Provider  aspirin 81 MG chewable tablet Chew 1 tablet (81 mg total) by mouth daily. 05/30/18   Layne Benton, NP  atorvastatin (LIPITOR) 20 MG tablet Take 20 mg by mouth at bedtime. 06/18/18   [provider]  clopidogrel (PLAVIX) 75 MG tablet Take 75 mg by mouth daily. 06/18/18   [provider]  gabapentin (NEURONTIN) 300 MG capsule Take 1 capsule (300 mg total) by mouth 2 (two) times daily. 07/04/18 08/03/18  Sharman Cheek, MD  losartan (COZAAR) 25 MG tablet Take 1 tablet (25 mg total) by mouth daily. 04/04/18   Love, Evlyn Kanner, PA-C  ticagrelor (BRILINTA) 90 MG TABS tablet Take 1 tablet (90 mg total) by mouth 2 (two) times daily. 05/29/18   Layne Benton, NP    Allergies Patient has no known allergies.  Family History  Problem Relation Age of Onset  . Diabetes Mother   . Heart disease Mother   . Hyperlipidemia Mother   . Hypertension Mother   . Stroke Mother   . Diabetes Father   . Hypertension Father   . Cancer Neg Hx     Social History Social History   Tobacco Use  .  Smoking status: Current Every Day Smoker    Packs/day: 0.50    Types: Cigarettes  . Smokeless tobacco: Never Used  Substance Use Topics  . Alcohol use: Yes    Comment: occasional   . Drug use: No    Review of Systems Constitutional: No fever/chills Eyes: No visual changes. ENT: No sore throat. No stiff neck no neck pain Cardiovascular: Denies chest pain. Respiratory: Denies shortness of breath. Gastrointestinal:   no vomiting.  No diarrhea.  No constipation. Genitourinary: Negative for dysuria. Musculoskeletal: Negative lower extremity swelling Skin: Negative for  rash. Neurological: see the HPI   ____________________________________________   PHYSICAL EXAM:  VITAL SIGNS: ED Triage Vitals  Enc Vitals Group     BP 08/08/18 1901 (!) 144/92     Pulse Rate 08/08/18 1901 78     Resp 08/08/18 1901 18     Temp 08/08/18 1901 99.1 F (37.3 C)     Temp Source 08/08/18 1901 Oral     SpO2 08/08/18 1901 97 %     Weight 08/08/18 1843 155 lb (70.3 kg)     Height 08/08/18 1843 5\' 8"  (1.727 m)     Head Circumference --      Peak Flow --      Pain Score 08/08/18 1843 0     Pain Loc --      Pain Edu? --      Excl. in GC? --     Constitutional: Alert in no acute distress, speech seems somewhat slurred unclear what his baseline is he does seem to have a right gaze preference at this time Eyes: Conjunctivae are normal Head: Atraumatic HEENT: No congestion/rhinnorhea. Mucous membranes are moist.  Oropharynx non-erythematous Neck:   Nontender with no meningismus, no masses, no stridor Cardiovascular: Normal rate, regular rhythm. Grossly normal heart sounds.  Good peripheral circulation. Respiratory: Normal respiratory effort.  No retractions. Lungs CTAB. Abdominal: Soft and nontender. No distention. No guarding no rebound Back:  There is no focal tenderness or step off.  there is no midline tenderness there are no lesions noted. there is no CVA tenderness  Musculoskeletal: No lower extremity tenderness, no upper extremity tenderness. No joint effusions, no DVT signs strong distal pulses no edema Neurologic: Very variable exam, patient does have a right gaze preference, however he has a normal smile with no droop noted, he has baseline left-sided upper extremity weakness which is still noted.  He is very reluctant to participate in the exam in general, otherwise he does seem to be diffusely weak rather than focally weak to the extent that can be determined.  Patient did not participate in finger-to-nose.  He is reluctant to do a neurologic exam at this time he  states.  He states he wants a sandwich. Skin:  Skin is warm, dry and intact. No rash noted. Psychiatric: Mood and affect are normal. Speech and behavior are normal.  ____________________________________________   LABS (all labs ordered are listed, but only abnormal results are displayed)  Labs Reviewed  CBC WITH DIFFERENTIAL/PLATELET - Abnormal; Notable for the following components:      Result Value   RDW 14.6 (*)    Neutro Abs 9.4 (*)    Lymphs Abs 0.4 (*)    All other components within normal limits  COMPREHENSIVE METABOLIC PANEL - Abnormal; Notable for the following components:   Potassium 3.4 (*)    All other components within normal limits  URINALYSIS, COMPLETE (UACMP) WITH MICROSCOPIC - Abnormal; Notable for the following components:  Color, Urine YELLOW (*)    APPearance CLEAR (*)    Ketones, ur 20 (*)    All other components within normal limits  URINE DRUG SCREEN, QUALITATIVE ([facility] ONLY) - Abnormal; Notable for the following components:   Cocaine Metabolite,Ur [place] POSITIVE (*)    Benzodiazepine, Ur Scrn TEST NOT PERFORMED, REAGENT NOT AVAILABLE (*)    All other components within normal limits  ETHANOL  PROTIME-INR  APTT  GLUCOSE, CAPILLARY  CBG MONITORING, ED    Pertinent labs  results that were available during my care of the patient were reviewed by me and considered in my medical decision making (see chart for details). ____________________________________________  EKG  I personally interpreted any EKGs ordered by me or triage Sinus rhythm rate 94 bpm no acute ST elevation or depression, LAD noted, no acute ischemia. ____________________________________________  RADIOLOGY  Pertinent labs & imaging results that were available during my care of the patient were reviewed by me and considered in my medical decision making (see chart for details). If possible, patient and/or family made aware of any abnormal findings.  Ct Head Wo Contrast  Result Date:  08/08/2018 CLINICAL DATA:  Generalized weakness EXAM: CT HEAD WITHOUT CONTRAST TECHNIQUE: Contiguous axial images were obtained from the base of the skull through the vertex without intravenous contrast. COMPARISON:  07/04/2018 MRI FINDINGS: Brain: Chronic small vessel disease throughout the deep white matter. Mild cerebral atrophy. No acute intracranial abnormality. Specifically, no hemorrhage, hydrocephalus, mass lesion, acute infarction, or significant intracranial injury. Vascular: Stents noted within the right middle cerebral artery. Skull: No acute calvarial abnormality. Sinuses/Orbits: Visualized paranasal sinuses and mastoids clear. Orbital soft tissues unremarkable. Other: None IMPRESSION: No acute intracranial abnormality. Atrophy, chronic microvascular disease. Electronically Signed   By: Charlett NoseKevin  Dover M.D.   On: 08/08/2018 19:44   ____________________________________________    PROCEDURES  Procedure(s) performed: None  Procedures  Critical Care performed: None  ____________________________________________   INITIAL IMPRESSION / ASSESSMENT AND PLAN / ED COURSE  Pertinent labs & imaging results that were available during my care of the patient were reviewed by me and considered in my medical decision making (see chart for details).  Feels that he might of had a stroke, very difficult to tell he is had multiple different CVAs in the past has baseline lesions, and has some degree of atypical presentation.  He does seem to have a right gaze preference.  Is unclear if this is voluntary or not.  Patient is again positive for cocaine which has caused him to have CVA in the past however last time he presented with similar symptoms as this he had a nondiagnostic MRI and was discharged.  Given his history, I think an MRI is the only way to tell if he had another stroke at this time.  Certainly not a candidate for TPA, patient has no idea when this time of onset was, he variably it was reported  as "this morning" and "4:00" and I do not think therefore that TPA is indicated however try to determine if he has had a new CVA is certainly important, CT is negative for CVA blood work is reassuring house is reassuring, vital signs are reassuring, he is principally indicated that his preoccupation is with getting a sandwich.  We will do a swallow study prior to any food  ----------------------------------------- 8:51 PM on 08/08/2018 -----------------------------------------  Patient is in no acute distress watching television, we will obtain an MRI.  Signed out to Dr. Sharma CovertNorman at the end of  my shift    ____________________________________________   FINAL CLINICAL IMPRESSION(S) / ED DIAGNOSES  Final diagnoses:  None      This chart was dictated using voice recognition software.  Despite best efforts to proofread,  errors can occur which can change meaning.      Jeanmarie Plant, MD 08/08/18 2030    Jeanmarie Plant, MD 08/08/18 2051

## 2018-08-08 NOTE — ED Notes (Signed)
MD made aware of patient condition at this time. EDP to bedside.

## 2018-08-08 NOTE — ED Notes (Signed)
Pt requested food. [place] sandwich meal provided.

## 2018-08-09 ENCOUNTER — Encounter: Payer: Self-pay | Admitting: Internal Medicine

## 2018-08-09 ENCOUNTER — Inpatient Hospital Stay: Payer: Medicare Other

## 2018-08-09 ENCOUNTER — Inpatient Hospital Stay
Admit: 2018-08-09 | Discharge: 2018-08-09 | Disposition: A | Discharge status: Home / Self Care | Payer: Medicare Other | Attending: Internal Medicine | Admitting: Internal Medicine

## 2018-08-09 ENCOUNTER — Other Ambulatory Visit: Payer: Self-pay

## 2018-08-09 DIAGNOSIS — Z8673 Personal history of transient ischemic attack (TIA), and cerebral infarction without residual deficits: Secondary | ICD-10-CM | POA: Diagnosis not present

## 2018-08-09 DIAGNOSIS — Z7151 Drug abuse counseling and surveillance of drug abuser: Secondary | ICD-10-CM | POA: Diagnosis not present

## 2018-08-09 DIAGNOSIS — Z7982 Long term (current) use of aspirin: Secondary | ICD-10-CM | POA: Diagnosis not present

## 2018-08-09 DIAGNOSIS — Z823 Family history of stroke: Secondary | ICD-10-CM | POA: Diagnosis not present

## 2018-08-09 DIAGNOSIS — I63511 Cerebral infarction due to unspecified occlusion or stenosis of right middle cerebral artery: Principal | ICD-10-CM

## 2018-08-09 DIAGNOSIS — I10 Essential (primary) hypertension: Secondary | ICD-10-CM | POA: Diagnosis present

## 2018-08-09 DIAGNOSIS — E876 Hypokalemia: Secondary | ICD-10-CM | POA: Diagnosis present

## 2018-08-09 DIAGNOSIS — Z79899 Other long term (current) drug therapy: Secondary | ICD-10-CM | POA: Diagnosis not present

## 2018-08-09 DIAGNOSIS — G8194 Hemiplegia, unspecified affecting left nondominant side: Secondary | ICD-10-CM | POA: Diagnosis present

## 2018-08-09 DIAGNOSIS — S46001A Unspecified injury of muscle(s) and tendon(s) of the rotator cuff of right shoulder, initial encounter: Secondary | ICD-10-CM | POA: Diagnosis present

## 2018-08-09 DIAGNOSIS — F17210 Nicotine dependence, cigarettes, uncomplicated: Secondary | ICD-10-CM | POA: Diagnosis present

## 2018-08-09 DIAGNOSIS — Z8349 Family history of other endocrine, nutritional and metabolic diseases: Secondary | ICD-10-CM | POA: Diagnosis not present

## 2018-08-09 DIAGNOSIS — Z7902 Long term (current) use of antithrombotics/antiplatelets: Secondary | ICD-10-CM | POA: Diagnosis not present

## 2018-08-09 DIAGNOSIS — X58XXXA Exposure to other specified factors, initial encounter: Secondary | ICD-10-CM | POA: Diagnosis present

## 2018-08-09 DIAGNOSIS — R414 Neurologic neglect syndrome: Secondary | ICD-10-CM | POA: Diagnosis present

## 2018-08-09 DIAGNOSIS — E785 Hyperlipidemia, unspecified: Secondary | ICD-10-CM | POA: Diagnosis present

## 2018-08-09 DIAGNOSIS — Z833 Family history of diabetes mellitus: Secondary | ICD-10-CM | POA: Diagnosis not present

## 2018-08-09 DIAGNOSIS — Z8249 Family history of ischemic heart disease and other diseases of the circulatory system: Secondary | ICD-10-CM | POA: Diagnosis not present

## 2018-08-09 DIAGNOSIS — F329 Major depressive disorder, single episode, unspecified: Secondary | ICD-10-CM | POA: Diagnosis present

## 2018-08-09 DIAGNOSIS — F1410 Cocaine abuse, uncomplicated: Secondary | ICD-10-CM | POA: Diagnosis present

## 2018-08-09 LAB — LIPID PANEL
CHOL/HDL RATIO: 4 ratio
CHOLESTEROL: 170 mg/dL (ref 0–200)
HDL: 43 mg/dL (ref >40)
LDL Cholesterol: 112 mg/dL — ABNORMAL HIGH (ref 0–99)
Triglycerides: 73 mg/dL (ref <150)
VLDL: 15 mg/dL (ref 0–40)

## 2018-08-09 LAB — HEMOGLOBIN A1C
HEMOGLOBIN A1C: 5.3 % (ref 4.8–5.6)
MEAN PLASMA GLUCOSE: 105.41 mg/dL

## 2018-08-09 LAB — ECHOCARDIOGRAM COMPLETE
HEIGHTINCHES: 68 in
Weight: 2513.6 oz

## 2018-08-09 LAB — MAGNESIUM: MAGNESIUM: 2.1 mg/dL (ref 1.7–2.4)

## 2018-08-09 MED ORDER — ACETAMINOPHEN 650 MG RE SUPP: 650.0000 mg | RECTAL | Status: DC | PRN

## 2018-08-09 MED ORDER — BISACODYL 5 MG PO TBEC
5.0000 mg | DELAYED_RELEASE_TABLET | Freq: Every day | ORAL | Status: DC | PRN
  Filled 2018-08-09: qty 1

## 2018-08-09 MED ORDER — ASPIRIN 81 MG PO CHEW
81.0000 mg | CHEWABLE_TABLET | Freq: Every day | ORAL | Status: DC
  Administered 2018-08-09 – 2018-08-16 (×8): 81 mg via ORAL
  Filled 2018-08-09 (×8): qty 1

## 2018-08-09 MED ORDER — SODIUM CHLORIDE 0.9 % IV SOLN
INTRAVENOUS | Status: DC
  Administered 2018-08-09 (×2): via INTRAVENOUS

## 2018-08-09 MED ORDER — STROKE: EARLY STAGES OF RECOVERY BOOK
Freq: Once | Status: AC
  Administered 2018-08-09: 03:00:00

## 2018-08-09 MED ORDER — ATORVASTATIN CALCIUM 20 MG PO TABS
40.0000 mg | ORAL_TABLET | Freq: Every day | ORAL | Status: DC
  Administered 2018-08-09 – 2018-08-15 (×7): 40 mg via ORAL
  Filled 2018-08-09 (×7): qty 2

## 2018-08-09 MED ORDER — POTASSIUM CHLORIDE CRYS ER 20 MEQ PO TBCR
40.0000 meq | EXTENDED_RELEASE_TABLET | Freq: Once | ORAL | Status: AC
  Administered 2018-08-09: 04:00:00 40 meq via ORAL
  Filled 2018-08-09: qty 2

## 2018-08-09 MED ORDER — ONDANSETRON HCL 4 MG/2ML IJ SOLN: 4.0000 mg | Freq: Four times a day (QID) | INTRAMUSCULAR | Status: DC | PRN

## 2018-08-09 MED ORDER — ASPIRIN 81 MG PO CHEW
324.0000 mg | CHEWABLE_TABLET | Freq: Once | ORAL | Status: AC
  Administered 2018-08-09: 324 mg via ORAL
  Filled 2018-08-09: qty 4

## 2018-08-09 MED ORDER — ACETAMINOPHEN 160 MG/5ML PO SOLN
650.0000 mg | ORAL | Status: DC | PRN
  Filled 2018-08-09: qty 20.3

## 2018-08-09 MED ORDER — ATORVASTATIN CALCIUM 20 MG PO TABS: 20.0000 mg | ORAL_TABLET | Freq: Every day | ORAL | Status: DC

## 2018-08-09 MED ORDER — LOSARTAN POTASSIUM 25 MG PO TABS
25.0000 mg | ORAL_TABLET | Freq: Every day | ORAL | Status: DC
  Administered 2018-08-09 – 2018-08-16 (×8): 25 mg via ORAL
  Filled 2018-08-09 (×8): qty 1

## 2018-08-09 MED ORDER — ENOXAPARIN SODIUM 40 MG/0.4ML ~~LOC~~ SOLN
40.0000 mg | SUBCUTANEOUS | Status: DC
  Administered 2018-08-09 – 2018-08-15 (×7): 40 mg via SUBCUTANEOUS
  Filled 2018-08-09 (×7): qty 0.4

## 2018-08-09 MED ORDER — ACETAMINOPHEN 325 MG PO TABS: 650.0000 mg | ORAL_TABLET | ORAL | Status: DC | PRN

## 2018-08-09 MED ORDER — SENNOSIDES-DOCUSATE SODIUM 8.6-50 MG PO TABS: 1.0000 | ORAL_TABLET | Freq: Every evening | ORAL | Status: DC | PRN

## 2018-08-09 MED ORDER — TICAGRELOR 90 MG PO TABS
90.0000 mg | ORAL_TABLET | Freq: Two times a day (BID) | ORAL | Status: DC
  Administered 2018-08-09 – 2018-08-16 (×15): 90 mg via ORAL
  Filled 2018-08-09 (×16): qty 1

## 2018-08-09 NOTE — Progress Notes (Signed)
*  PRELIMINARY RESULTS* Echocardiogram 2D Echocardiogram has been performed.  Cory GulaJoan M [name] 08/09/2018, 11:06 AM

## 2018-08-09 NOTE — [place] FL2 (Signed)
[place] MEDICAID FL2 LEVEL OF CARE SCREENING TOOL     IDENTIFICATION  Patient Name: Cory Harvey Birthdate: Dec 21, 1960 Sex: male Admission Date (Current Location): 08/08/2018  [place] and [place] Number:  Chiropodist and Address:  [facility], [address], [place], [place] [address]      Provider Number: [id number]  Attending Physician Name and Address:  Enid Baas, MD  Relative Name and Phone Number:  Waymon Budge Mother      Current Level of Care: Hospital Recommended Level of Care: Skilled Nursing Facility Prior Approval Number:    Date Approved/Denied:   PASRR Number: Pending  Discharge Plan: SNF    Current Diagnoses: Patient Active Problem List   Diagnosis Date Noted  . Arterial ischemic stroke, MCA (middle cerebral artery), right, acute (HCC) 08/09/2018  . Hyperlipidemia 05/29/2018  . Cocaine abuse (HCC) 05/29/2018  . Groin hematoma, right 05/29/2018  . Acute ischemic right MCA stroke [facility]) s/p neurointervention w/ R ICA stent placed 05/25/2018  . Middle cerebral artery stenosis, right 05/25/2018  . Cerebrovascular accident (CVA) involving right cerebral hemisphere (HCC) 03/29/2018  . CVA (cerebral vascular accident) (HCC) 03/26/2018  . Acute CVA (cerebrovascular accident) (HCC) 09/02/2017  . Gait disturbance 09/02/2017  . History of CVA (cerebrovascular accident) 04/18/2017  . Tobacco use 04/18/2017  . Depression   . Hypertension   . Stroke (HCC)     Orientation RESPIRATION BLADDER Height & Weight     Time, Situation, Place, Self  Normal Incontinent Weight: 157 lb 1.6 oz (71.3 kg) Height:  5\' 8"  (172.7 cm)  BEHAVIORAL SYMPTOMS/MOOD NEUROLOGICAL BOWEL NUTRITION STATUS      Continent Diet(Cardiac diet)  AMBULATORY STATUS COMMUNICATION OF NEEDS Skin   Limited Assist Verbally Normal                       Personal Care Assistance Level of Assistance  Bathing, Feeding, Dressing Bathing  Assistance: Limited assistance Feeding assistance: Limited assistance Dressing Assistance: Limited assistance     Functional Limitations Info  Hearing, Sight, Speech Sight Info: Adequate Hearing Info: Adequate Speech Info: Adequate    SPECIAL CARE FACTORS FREQUENCY  PT (By licensed PT), OT (By licensed OT)     PT Frequency: 5x a week OT Frequency: 5x a week            Contractures Contractures Info: Not present    Additional Factors Info  Code Status, Allergies Code Status Info: Full Code Allergies Info: NKA           Current Medications (08/09/2018):  This is the current hospital active medication list Current Facility-Administered Medications  Medication Dose Route Frequency Provider Last Rate Last Dose  . 0.9 %  sodium chloride infusion   Intravenous Continuous Barbaraann Rondo, MD 10 mL/hr at 08/09/18 1536    . acetaminophen (TYLENOL) tablet 650 mg  650 mg Oral Q4H PRN Barbaraann Rondo, MD       Or  . acetaminophen (TYLENOL) solution 650 mg  650 mg Per Tube Q4H PRN Barbaraann Rondo, MD       Or  . acetaminophen (TYLENOL) suppository 650 mg  650 mg Rectal Q4H PRN Barbaraann Rondo, MD      . aspirin chewable tablet 81 mg  81 mg Oral Daily Barbaraann Rondo, MD   81 mg at 08/09/18 0929  . atorvastatin (LIPITOR) tablet 40 mg  40 mg Oral QHS Enid Baas, MD      . bisacodyl (DULCOLAX) EC  tablet 5 mg  5 mg Oral Daily PRN Barbaraann RondoSridharan, Prasanna, MD      . enoxaparin (LOVENOX) injection 40 mg  40 mg Subcutaneous Q24H Sridharan, Prasanna, MD      . losartan (COZAAR) tablet 25 mg  25 mg Oral Daily Barbaraann RondoSridharan, Prasanna, MD   25 mg at 08/09/18 0929  . ondansetron (ZOFRAN) injection 4 mg  4 mg Intravenous Q6H PRN Barbaraann RondoSridharan, Prasanna, MD      . senna-docusate (Senokot-S) tablet 1 tablet  1 tablet Oral QHS PRN Barbaraann RondoSridharan, Prasanna, MD      . ticagrelor (BRILINTA) tablet 90 mg  90 mg Oral BID Barbaraann RondoSridharan, Prasanna, MD   90 mg at 08/09/18 [id number]     Discharge  Medications: Please see discharge summary for a list of discharge medications.  Relevant Imaging Results:  Relevant Lab Results:   Additional Information SS#: [id number]  [employer], Brentley [place] R, LCSWA

## 2018-08-09 NOTE — Clinical Social Work Note (Signed)
CSW spoke with patient and he is agreeable to going to SNF for short term rehab, CSW was giving permission to begin bed search in [place].  Patient is a Level 2 [place] Manual Screen, patient will not be able to discharge to SNF until Passar number is received and patient's insurance has approved him for SNF.  CSW to continue to follow patient's progress throughout discharge planning.  Cory Harvey, MSW, Theresia MajorsLCSWA [phone]  08/09/2018 6:30 PM

## 2018-08-09 NOTE — H&P (Signed)
[facility] [place] [facility]   PATIENT NAME: Cory Harvey    MR#:  [id number]  DATE OF BIRTH:  05/22/1961  DATE OF ADMISSION:  08/08/2018  PRIMARY CARE PHYSICIAN: Marisue Ivan, MD   REQUESTING/REFERRING PHYSICIAN: Irean Hong, MD  CHIEF COMPLAINT:   Chief Complaint  Patient presents with  . Weakness    HISTORY OF PRESENT ILLNESS:  Cory Harvey  is a 57 y.o. male with a known history of cocaine use, CVA p/w acute weakness (generalized + L-sided) x1d. Pt is unable to provide much Hx. He states that @~1600PM on Thursday 08/08/2018, he was lying on the floor unable to get up. He does not remember how he got there. He denies falling. He states that his friend visited him, and found him down on the ground. His friend called EMS. Pt states that he feels weak all over, but he is especially weak on his L side. He has no complaints apart from weakness. He states he felt fine in the morning. He states that he does not remember when he last used cocaine, but it was "not today", though he does admit to continued cocaine use. He is filthy, and smells of urine, suggestive of self care deficit. He states he takes all of his home medications, including his antiplatelet agents. He states that he smokes 5 cigarettes per day. He has stuttering speech, but states this is old. He exhibits L-sided weakness, as well as some minimal L-sided neglect on exam. MRI brain performed in ED (+) "Large region of acute/early subacute infarction within the right posterior MCA distribution. No associated hemorrhage identified, however, the susceptibility sequences are motion degraded and petechial hemorrhage may not be visible. Minimal associated local mass effect."  PAST MEDICAL HISTORY:   Past Medical History:  Diagnosis Date  . Depression   . History of CVA (cerebrovascular accident)   . Hypertension   . Stroke (HCC)    x 4  . Tobacco abuse     PAST SURGICAL HISTORY:   Past  Surgical History:  Procedure Laterality Date  . IR CT HEAD LTD  05/25/2018  . IR INTRA CRAN STENT  05/25/2018  . kidney surgery Left 1983  . RADIOLOGY WITH ANESTHESIA N/A 05/25/2018   Procedure: RADIOLOGY WITH ANESTHESIA;  Surgeon: Julieanne Cotton, MD;  Location: [facility] OR;  Service: Radiology;  Laterality: N/A;  . TEE WITHOUT CARDIOVERSION N/A 03/27/2018   Procedure: TRANSESOPHAGEAL ECHOCARDIOGRAM (TEE);  Surgeon: Dalia Heading, MD;  Location: [facility] ORS;  Service: Cardiovascular;  Laterality: N/A;    SOCIAL HISTORY:   Social History   Tobacco Use  . Smoking status: Current Every Day Smoker    Packs/day: 0.50    Types: Cigarettes  . Smokeless tobacco: Never Used  Substance Use Topics  . Alcohol use: Yes    Comment: occasional     FAMILY HISTORY:   Family History  Problem Relation Age of Onset  . Diabetes Mother   . Heart disease Mother   . Hyperlipidemia Mother   . Hypertension Mother   . Stroke Mother   . Diabetes Father   . Hypertension Father   . Cancer Neg Hx     DRUG ALLERGIES:  No Known Allergies  REVIEW OF SYSTEMS:   Review of Systems  Constitutional: Positive for malaise/fatigue. Negative for chills, diaphoresis, fever and weight loss.  HENT: Negative for congestion, ear pain, hearing loss, nosebleeds, sinus pain, sore throat and tinnitus.   Eyes: Negative for blurred vision,  double vision, photophobia, pain, discharge and redness.  Respiratory: Negative for cough, hemoptysis, sputum production, shortness of breath and wheezing.   Cardiovascular: Negative for chest pain, palpitations, orthopnea, claudication, leg swelling and PND.  Gastrointestinal: Negative for abdominal pain, blood in stool, constipation, diarrhea, heartburn, melena, nausea and vomiting.  Genitourinary: Negative for dysuria, flank pain, frequency, hematuria and urgency.  Musculoskeletal: Negative for back pain, joint pain, myalgias and neck pain.  Skin: Negative for itching and rash.    Neurological: Positive for focal weakness and weakness. Negative for dizziness, tingling, tremors, sensory change, speech change, seizures and headaches.  Psychiatric/Behavioral: The patient does not have insomnia.    MEDICATIONS AT HOME:   Prior to Admission medications   Medication Sig Start Date End Date Taking? Authorizing Provider  aspirin 81 MG chewable tablet Chew 1 tablet (81 mg total) by mouth daily. 05/30/18  Yes Layne Benton, NP  atorvastatin (LIPITOR) 20 MG tablet Take 20 mg by mouth at bedtime. 06/18/18  Yes [provider]  clopidogrel (PLAVIX) 75 MG tablet Take 75 mg by mouth daily. 06/18/18  Yes [provider]  losartan (COZAAR) 25 MG tablet Take 1 tablet (25 mg total) by mouth daily. 04/04/18  Yes Love, Evlyn Kanner, PA-C  ticagrelor (BRILINTA) 90 MG TABS tablet Take 1 tablet (90 mg total) by mouth 2 (two) times daily. 05/29/18  Yes Layne Benton, NP      VITAL SIGNS:  Blood pressure (!) 136/92, pulse 78, temperature 98.8 F (37.1 C), resp. rate 18, height 5\' 8"  (1.727 m), weight 70.3 kg, SpO2 97 %.  PHYSICAL EXAMINATION:  Physical Exam  Constitutional: He is oriented to person, place, and time. He appears well-developed and well-nourished. He is active and cooperative.  Non-toxic appearance. He does not have a sickly appearance. He does not appear ill. No distress. He is not intubated.  HENT:  Head: Atraumatic.  Mouth/Throat: Oropharynx is clear and moist. No oropharyngeal exudate.  Eyes: Conjunctivae, EOM and lids are normal. No scleral icterus.  Neck: Neck supple. No JVD present. No thyromegaly present.  Cardiovascular: Normal rate, regular rhythm, S1 normal, S2 normal and normal heart sounds.  No extrasystoles are present. Exam reveals no gallop, no S3, no S4, no distant heart sounds and no friction rub.  No murmur heard. Pulmonary/Chest: Effort normal. No accessory muscle usage or stridor. No apnea, no tachypnea and no bradypnea. He is not intubated. No  respiratory distress. He has decreased breath sounds in the right upper field, the right middle field, the right lower field, the left upper field, the left middle field and the left lower field. He has no wheezes. He has no rhonchi. He has no rales.  Abdominal: Soft. Bowel sounds are normal. He exhibits no distension. There is no tenderness. There is no rigidity, no rebound and no guarding.  Musculoskeletal: He exhibits no edema or tenderness.  Lymphadenopathy:    He has no cervical adenopathy.  Neurological: He is alert and oriented to person, place, and time. He is not disoriented. No cranial nerve deficit.  5/5 strength RUE + RLE. LUE 3/5 grip, 2/5 proximal. LLE 3/5. (-) sensory deficit to crude touch. Reflexes 2+, symmetrical B/L. Cranial nerve exam WNL. Cerebellar testing WNL. Mild L-sided neglect. Stuttering speech (old).  Skin: Skin is warm, dry and intact. No rash noted. He is not diaphoretic. No erythema.  Psychiatric: He has a normal mood and affect. His behavior is normal. Judgment and thought content normal.  Stuttering speech (old).  LABORATORY PANEL:   CBC Recent Labs  Lab 08/08/18 1900  WBC 10.6  HGB 15.2  HCT 45.3  PLT 358   ------------------------------------------------------------------------------------------------------------------  Chemistries  Recent Labs  Lab 08/08/18 1900  NA 142  K 3.4*  CL 111  CO2 23  GLUCOSE 88  BUN 10  CREATININE 1.13  CALCIUM 9.4  AST 22  ALT 15  ALKPHOS 51  BILITOT 1.1   ------------------------------------------------------------------------------------------------------------------  Cardiac Enzymes No results for input(s): TROPONINI in the last 168 hours. ------------------------------------------------------------------------------------------------------------------  RADIOLOGY:  Dg Eye Foreign Body  Result Date: 08/08/2018 CLINICAL DATA:  Metal working/exposure; clearance prior to MRI EXAM: ORBITS FOR FOREIGN  BODY - 2 VIEW COMPARISON:  Head CT dated 08/08/2018 FINDINGS: High attenuating densities noted over the right orbit corresponding to the right MCA stent seen on the earlier CT. No additional radiopaque foreign object identified. IMPRESSION: Right MCA stent. Electronically Signed   By: Elgie Collard M.D.   On: 08/08/2018 22:04   Dg Chest 1 View  Result Date: 08/08/2018 CLINICAL DATA:  Initial evaluation for generalized weakness. EXAM: CHEST  1 VIEW COMPARISON:  Prior radiograph from 05/25/2018. FINDINGS: Mild cardiomegaly, stable. Mediastinal silhouette normal. Tortuosity the intrathoracic aorta noted. Lungs mildly hypoinflated. No focal infiltrates. Mild diffuse interstitial prominence with perihilar vascular congestion without frank alveolar edema. No pleural effusion. No pneumothorax. No acute osseus abnormality. IMPRESSION: Cardiomegaly with mild diffuse pulmonary interstitial congestion without frank alveolar edema. Electronically Signed   By: Rise Mu M.D.   On: 08/08/2018 22:05   Dg Abd 1 View  Result Date: 08/08/2018 CLINICAL DATA:  Initial evaluation for generalized weakness. EXAM: ABDOMEN - 1 VIEW COMPARISON:  None. FINDINGS: Bowel gas pattern within normal limits without obstruction or ileus. No abnormal bowel wall thickening. No appreciable free air on these limited supine views. No soft tissue mass or abnormal calcification. Mild degenerative changes noted within lower lumbar spine. IMPRESSION: Nonobstructive bowel gas pattern with no radiographic evidence for acute intra-abdominal process. Electronically Signed   By: Rise Mu M.D.   On: 08/08/2018 22:07   Ct Head Wo Contrast  Result Date: 08/08/2018 CLINICAL DATA:  Generalized weakness EXAM: CT HEAD WITHOUT CONTRAST TECHNIQUE: Contiguous axial images were obtained from the base of the skull through the vertex without intravenous contrast. COMPARISON:  07/04/2018 MRI FINDINGS: Brain: Chronic small vessel disease  throughout the deep white matter. Mild cerebral atrophy. No acute intracranial abnormality. Specifically, no hemorrhage, hydrocephalus, mass lesion, acute infarction, or significant intracranial injury. Vascular: Stents noted within the right middle cerebral artery. Skull: No acute calvarial abnormality. Sinuses/Orbits: Visualized paranasal sinuses and mastoids clear. Orbital soft tissues unremarkable. Other: None IMPRESSION: No acute intracranial abnormality. Atrophy, chronic microvascular disease. Electronically Signed   By: Charlett Nose M.D.   On: 08/08/2018 19:44   [name]  Result Date: 08/09/2018 CLINICAL DATA:  57 y/o M; gaze preference to the right. History of CVA and cocaine use. EXAM: MRI HEAD WITHOUT CONTRAST TECHNIQUE: Multiplanar, multiecho pulse sequences of the brain and surrounding structures were obtained without intravenous contrast. COMPARISON:  07/04/2018 MRI of the head.  08/08/2018 CT head. FINDINGS: Brain: Large region of patchy reduced diffusion throughout the right posterior frontal lobe, posterior temporal lobe, parietal lobe, and the posterior insula compatible with acute/early subacute infarction. The region of infarction is associated with minimal T2 FLAIR hyperintense signal abnormality and local mass effect. No new susceptibility hypointensity to indicate intracranial hemorrhage, however, the susceptibility sequences motion degraded in small hemorrhage may not  be visible. Stable hemosiderin deposition from chronic hemorrhage in the right posterior frontal lobe. Stable small chronic infarctions in the right hemi pons and scattered throughout the basal ganglia greatest in the left thalamus and right caudate head. Stable advanced chronic microvascular ischemic changes and volume loss of the brain. No extra-axial collection, hydrocephalus, or herniation. Vascular: Stable susceptibility blooming from right middle cerebral artery stents in the MCA cistern and the sylvian  fissure. Skull and upper cervical spine: Normal marrow signal. Sinuses/Orbits: Negative. Other: None. IMPRESSION: 1. Large region of acute/early subacute infarction within the right posterior MCA distribution. No associated hemorrhage identified, however, the susceptibility sequences are motion degraded and petechial hemorrhage may not be visible. Minimal associated local mass effect. 2. Stable background of advanced chronic microvascular ischemic changes, volume loss of the brain, and multiple small chronic infarcts in basal ganglia as well as pons. These results were called by telephone at the time of interpretation on 08/09/2018 at 12:41 am to Dr. Dolores FrameSung, who verbally acknowledged these results. Electronically Signed   By: Mitzi HansenLance  Furusawa-Stratton M.D.   On: 08/09/2018 00:45   IMPRESSION AND PLAN:   A/P: 49M acute/subacute R MCA CVA. Hypokalemia. -CVA: Cocaine use, tobacco use. MRI (+). CVA pathway. Cardiac monitoring. NIHSS, neuro checks, fall precautions. Passed swallow eval, ordered for diet. Echo + Carotid U/S pending. P/T + O/T + SLP. Neuro consult. C/w ASA, Brilinta, statin. Advised cessation of cocaine use, smoking cessation. -Hypokalemia: K+ 3.4, mild. Replete. Mag level pending. -c/w home meds. -FEN/GI: Cardiac diet. -DVT PPx: Lovenox. -Code status: Full code. -Disposition: Admission, > 2 midnights.   All the records are reviewed and case discussed with ED provider. Management plans discussed with the patient, family and they are in agreement.  CODE STATUS: Full code.  TOTAL TIME TAKING CARE OF THIS PATIENT: 75 minutes.    Barbaraann RondoPrasanna Marshelle Bilger M.D on 08/09/2018 at 2:20 AM  Between 7am to 6pm - Pager - [phone]  After 6pm go to [email or web address] - Social research officer, governmentpassword EPAS [facility]  [facility] [place] [facility]  Office  [phone]  CC: Primary care physician; Marisue IvanLinthavong, [place], MD   Note: This dictation was prepared with Dragon dictation along with smaller phrase technology. Any  transcriptional errors that result from this process are unintentional.

## 2018-08-09 NOTE — Progress Notes (Signed)
Pt answered all admission questions, although some answers are not consistent with labs.  He denies taking cocaine and alcohol, and states he only smokes 3 cigarettes per day.  He is showing severe hemianopia to the left side and does not appear to see the meal tray in front of himself.  He needs set up and assistance with feeding.  Pt states he lives alone and performs all ADLs except cooking.  He came to me starving and ate an entire sandwich tray, not without trouble.

## 2018-08-09 NOTE — Consult Note (Addendum)
Referring Physician: Barbaraann Rondo, MD    Chief Complaint: Left side weakness  HPI: Cory Harvey is an 57 y.o. male with pertinent history of prior strokes X 4 status post IR stent placement, cocaine use, hypertension, smoking, hyperlipidemia presenting to the ED on 08/08/2018 with left side weakness. He is unable to provide history this morning but per ED reports he was found lying down on the floor unable to get up. Per EMS reports, patient told them that he was feeling fatigued and weak, went to lay and cough and missed the couch was unable to get up. He apparently told ED staff that  He did not remember that events prior to after the episode. His friend found him and called EMS. On arrival to the ED, he was noted to have right side gaze preference, left hemi-spatial neglect, loss of visual field on the left, left side weakness and decreased sensation. Initial CT head was negative, follow up MRI brain showed large region of acute/early subacute infarction within the right posterior MCA distribution and positive for cocaine. He was admitted for full stroke work up and management.  Date last known well: Date: 08/08/2018 Time last known well: Unknown tPA Given: No: No known last time known well.  Past Medical History:  Diagnosis Date  . Depression   . History of CVA (cerebrovascular accident)   . Hypertension   . Stroke (HCC)    x 4  . Tobacco abuse     Past Surgical History:  Procedure Laterality Date  . IR CT HEAD LTD  05/25/2018  . IR INTRA CRAN STENT  05/25/2018  . kidney surgery Left 1983  . RADIOLOGY WITH ANESTHESIA N/A 05/25/2018   Procedure: RADIOLOGY WITH ANESTHESIA;  Surgeon: Julieanne Cotton, MD;  Location: [facility] OR;  Service: Radiology;  Laterality: N/A;  . TEE WITHOUT CARDIOVERSION N/A 03/27/2018   Procedure: TRANSESOPHAGEAL ECHOCARDIOGRAM (TEE);  Surgeon: Dalia Heading, MD;  Location: [facility] ORS;  Service: Cardiovascular;  Laterality: N/A;    Family History   Problem Relation Age of Onset  . Diabetes Mother   . Heart disease Mother   . Hyperlipidemia Mother   . Hypertension Mother   . Stroke Mother   . Diabetes Father   . Hypertension Father   . Cancer Neg Hx    Social History:  reports that he has been smoking cigarettes. He has been smoking about 0.25 packs per day. He has never used smokeless tobacco. He reports that he drank alcohol. He reports that he does not use drugs.  UDS positive for cocaine.  Allergies: No Known Allergies  Medications:  I have reviewed the patient's current medications. Prior to Admission:  Medications Prior to Admission  Medication Sig Dispense Refill Last Dose  . aspirin 81 MG chewable tablet Chew 1 tablet (81 mg total) by mouth daily. 30 tablet 2 Past Month at Unknown time  . atorvastatin (LIPITOR) 20 MG tablet Take 20 mg by mouth at bedtime.  3 Past Month at Unknown time  . clopidogrel (PLAVIX) 75 MG tablet Take 75 mg by mouth daily.  0 Past Month at Unknown time  . losartan (COZAAR) 25 MG tablet Take 1 tablet (25 mg total) by mouth daily. 30 tablet 0 Past Month at Unknown time  . ticagrelor (BRILINTA) 90 MG TABS tablet Take 1 tablet (90 mg total) by mouth 2 (two) times daily. 60 tablet 2 Past Month at Unknown time   Scheduled: . aspirin  81 mg Oral Daily  . atorvastatin  20 mg Oral QHS  . enoxaparin (LOVENOX) injection  40 mg Subcutaneous Q24H  . losartan  25 mg Oral Daily  . ticagrelor  90 mg Oral BID    ROS: History obtained from the patient   General ROS: negative for - chills, fatigue, fever, night sweats, weight gain or weight loss Psychological ROS: negative for - behavioral disorder, hallucinations, memory difficulties, mood swings or suicidal ideation Ophthalmic ROS: negative for - blurry vision, double vision, eye pain or loss of vision ENT ROS: negative for - epistaxis, nasal discharge, oral lesions, sore throat, tinnitus or vertigo Allergy and Immunology ROS: negative for - hives or  itchy/watery eyes Hematological and Lymphatic ROS: negative for - bleeding problems, bruising or swollen lymph nodes Endocrine ROS: negative for - galactorrhea, hair pattern changes, polydipsia/polyuria or temperature intolerance Respiratory ROS: negative for - cough, hemoptysis, shortness of breath or wheezing Cardiovascular ROS: negative for - chest pain, dyspnea on exertion, edema or irregular heartbeat Gastrointestinal ROS: negative for - abdominal pain, diarrhea, hematemesis, nausea/vomiting or stool incontinence Genito-Urinary ROS: negative for - dysuria, hematuria, incontinence or urinary frequency/urgency Musculoskeletal ROS: negative for - joint swelling, positive for left side weakness Neurological ROS: as noted in HPI Dermatological ROS: negative for rash and skin lesion changes   Physical Examination: Blood pressure 135/82, pulse 75, temperature 98.7 F (37.1 C), temperature source Oral, resp. rate 18, height 5\' 8"  (1.727 m), weight 71.3 kg, SpO2 100 %.  General Exam Patient looks appropriate of age, well built, nourished and appropriately groomed.  Cardiovascular Exam: S1, S2 heart sounds present Carotid exam revealed no bruit Lung exam was clear to auscultation ? Neurological Exam  Mental Status: Asleep but arouses easily with stimulation Oriented to person and palce but not to time and situation Attention span and concentration seemed impaaired Intact naming, repetition, comprehension.  Followed 2 step commands with some difficulty Left hemi-spatial neglect Flat affect  Cranial Nerves: I. Olfactory not examined II: Left homonymous hemianopia. Right gaze preference. Pupils were equal, round and reactive to light and accommodation III,IV, VI: ptosis not present, extra-ocular motions intact bilaterally V,VII: smile symmetric, facial light touch sensation decreased on the left VIII: Finger rub was heard symmetric in both ears IX, X: Palate and uvular movements are  normal and oral sensations are OK, gag reflex deffered XI: Neck muscle strength and shoulder shrug is normal XII: midline tongue extension  Motor Exam:  Left side weakness LUE-1/5, LLE -3/5 Muscle strength in LUE  extremities is 5/5. No abnormal movements, fasciculations or atrophy seen  Deep Tendon Reflexes: Right Biceps is 2+, Left Biceps is 2+ Right Triceps is 2+, Left Triceps is 2+ Right Brachioradialis is 2+, Left Brachioradialis is 2+ Right Knee Jerk is 2+, Left Knee Jerk is 2+ Right Ankle Jerk is 2+, Left Ankle Jerk is 2+ Right Toes are down going, Left Toes are down going  Sensory Exam:Sensations were decreased left face/arm>leg Co-ordination: Finger to nose is normal Gait: Gait and station are normal when examined in small exam room   Data Reviewed   Laboratory Studies:  Basic Metabolic Panel: Recent Labs  Lab 08/08/18 1900 08/09/18 0311  NA 142  --   K 3.4*  --   CL 111  --   CO2 23  --   GLUCOSE 88  --   BUN 10  --   CREATININE 1.13  --   CALCIUM 9.4  --   MG  --  2.1    Liver Function Tests: Recent Labs  Lab 08/08/18 1900  AST 22  ALT 15  ALKPHOS 51  BILITOT 1.1  PROT 6.7  ALBUMIN 4.2   No results for input(s): LIPASE, AMYLASE in the last 168 hours. No results for input(s): AMMONIA in the last 168 hours.  CBC: Recent Labs  Lab 08/08/18 1900  WBC 10.6  NEUTROABS 9.4*  HGB 15.2  HCT 45.3  MCV 84.1  PLT 358    Cardiac Enzymes: No results for input(s): CKTOTAL, CKMB, CKMBINDEX, TROPONINI in the last 168 hours.  BNP: Invalid input(s): POCBNP  CBG: Recent Labs  Lab 08/08/18 1930  GLUCAP 83    Microbiology: Results for orders placed or performed during the hospital encounter of 05/25/18  MRSA PCR Screening     Status: None   Collection Time: 05/26/18  6:00 AM  Result Value Ref Range Status   MRSA by PCR NEGATIVE NEGATIVE Final    Comment:        The GeneXpert MRSA Assay (FDA approved for NASAL specimens only), is one  component of a comprehensive MRSA colonization surveillance program. It is not intended to diagnose MRSA infection nor to guide or monitor treatment for MRSA infections. Performed at [facility], [address] [place], [place] [address]     Coagulation Studies: Recent Labs    08/08/18 1900  LABPROT 13.6  INR 1.05    Urinalysis:  Recent Labs  Lab 08/08/18 1901  COLORURINE YELLOW*  LABSPEC 1.021  PHURINE 5.0  GLUCOSEU NEGATIVE  HGBUR NEGATIVE  BILIRUBINUR NEGATIVE  KETONESUR 20*  PROTEINUR NEGATIVE  NITRITE NEGATIVE  LEUKOCYTESUR NEGATIVE    Lipid Panel:    Component Value Date/Time   CHOL 170 08/09/2018 0311   CHOL 150 09/21/2014 0408   TRIG 73 08/09/2018 0311   TRIG 40 09/21/2014 0408   HDL 43 08/09/2018 0311   HDL 76 (H) 09/21/2014 0408   CHOLHDL 4.0 08/09/2018 0311   VLDL 15 08/09/2018 0311   VLDL 8 09/21/2014 0408   LDLCALC 112 (H) 08/09/2018 0311   LDLCALC 66 09/21/2014 0408    HgbA1C:  Lab Results  Component Value Date   HGBA1C 5.6 05/25/2018    Urine Drug Screen:      Component Value Date/Time   LABOPIA NONE DETECTED 08/08/2018 1902   LABOPIA NONE DETECTED 05/25/2018 0600   COCAINSCRNUR POSITIVE (A) 08/08/2018 1902   LABBENZ TEST NOT PERFORMED, REAGENT NOT AVAILABLE (A) 08/08/2018 1902   LABBENZ NONE DETECTED 05/25/2018 0600   AMPHETMU NONE DETECTED 08/08/2018 1902   AMPHETMU NONE DETECTED 05/25/2018 0600   THCU NONE DETECTED 08/08/2018 1902   THCU NONE DETECTED 05/25/2018 0600   LABBARB NONE DETECTED 08/08/2018 1902   LABBARB (A) 05/25/2018 0600    Result not available. Reagent lot number recalled by manufacturer.    Alcohol Level:  Recent Labs  Lab 08/08/18 1900  ETH <10    Other results: EKG: sinus rhythm at 70 bpm.  Imaging: Dg Eye Foreign Body  Result Date: 08/08/2018 CLINICAL DATA:  Metal working/exposure; clearance prior to MRI EXAM: ORBITS FOR FOREIGN BODY - 2 VIEW COMPARISON:  Head CT dated 08/08/2018  FINDINGS: High attenuating densities noted over the right orbit corresponding to the right MCA stent seen on the earlier CT. No additional radiopaque foreign object identified. IMPRESSION: Right MCA stent. Electronically Signed   By: Elgie Collard M.D.   On: 08/08/2018 22:04   Dg Chest 1 View  Result Date: 08/08/2018 CLINICAL DATA:  Initial evaluation for generalized weakness. EXAM: CHEST  1 VIEW COMPARISON:  Prior radiograph from 05/25/2018. FINDINGS: Mild cardiomegaly, stable. Mediastinal silhouette normal. Tortuosity the intrathoracic aorta noted. Lungs mildly hypoinflated. No focal infiltrates. Mild diffuse interstitial prominence with perihilar vascular congestion without frank alveolar edema. No pleural effusion. No pneumothorax. No acute osseus abnormality. IMPRESSION: Cardiomegaly with mild diffuse pulmonary interstitial congestion without frank alveolar edema. Electronically Signed   By: Rise Mu M.D.   On: 08/08/2018 22:05   Dg Abd 1 View  Result Date: 08/08/2018 CLINICAL DATA:  Initial evaluation for generalized weakness. EXAM: ABDOMEN - 1 VIEW COMPARISON:  None. FINDINGS: Bowel gas pattern within normal limits without obstruction or ileus. No abnormal bowel wall thickening. No appreciable free air on these limited supine views. No soft tissue mass or abnormal calcification. Mild degenerative changes noted within lower lumbar spine. IMPRESSION: Nonobstructive bowel gas pattern with no radiographic evidence for acute intra-abdominal process. Electronically Signed   By: Rise Mu M.D.   On: 08/08/2018 22:07   Ct Head Wo Contrast  Result Date: 08/08/2018 CLINICAL DATA:  Generalized weakness EXAM: CT HEAD WITHOUT CONTRAST TECHNIQUE: Contiguous axial images were obtained from the base of the skull through the vertex without intravenous contrast. COMPARISON:  07/04/2018 MRI FINDINGS: Brain: Chronic small vessel disease throughout the deep white matter. Mild cerebral  atrophy. No acute intracranial abnormality. Specifically, no hemorrhage, hydrocephalus, mass lesion, acute infarction, or significant intracranial injury. Vascular: Stents noted within the right middle cerebral artery. Skull: No acute calvarial abnormality. Sinuses/Orbits: Visualized paranasal sinuses and mastoids clear. Orbital soft tissues unremarkable. Other: None IMPRESSION: No acute intracranial abnormality. Atrophy, chronic microvascular disease. Electronically Signed   By: Charlett Nose M.D.   On: 08/08/2018 19:44   [name]  Result Date: 08/09/2018 CLINICAL DATA:  57 y/o M; gaze preference to the right. History of CVA and cocaine use. EXAM: MRI HEAD WITHOUT CONTRAST TECHNIQUE: Multiplanar, multiecho pulse sequences of the brain and surrounding structures were obtained without intravenous contrast. COMPARISON:  07/04/2018 MRI of the head.  08/08/2018 CT head. FINDINGS: Brain: Large region of patchy reduced diffusion throughout the right posterior frontal lobe, posterior temporal lobe, parietal lobe, and the posterior insula compatible with acute/early subacute infarction. The region of infarction is associated with minimal T2 FLAIR hyperintense signal abnormality and local mass effect. No new susceptibility hypointensity to indicate intracranial hemorrhage, however, the susceptibility sequences motion degraded in small hemorrhage may not be visible. Stable hemosiderin deposition from chronic hemorrhage in the right posterior frontal lobe. Stable small chronic infarctions in the right hemi pons and scattered throughout the basal ganglia greatest in the left thalamus and right caudate head. Stable advanced chronic microvascular ischemic changes and volume loss of the brain. No extra-axial collection, hydrocephalus, or herniation. Vascular: Stable susceptibility blooming from right middle cerebral artery stents in the MCA cistern and the sylvian fissure. Skull and upper cervical spine: Normal  marrow signal. Sinuses/Orbits: Negative. Other: None. IMPRESSION: 1. Large region of acute/early subacute infarction within the right posterior MCA distribution. No associated hemorrhage identified, however, the susceptibility sequences are motion degraded and petechial hemorrhage may not be visible. Minimal associated local mass effect. 2. Stable background of advanced chronic microvascular ischemic changes, volume loss of the brain, and multiple small chronic infarcts in basal ganglia as well as pons. These results were called by telephone at the time of interpretation on 08/09/2018 at 12:41 am to Dr. Dolores Frame, who verbally acknowledged these results. Electronically Signed   By: Mitzi Hansen M.D.   On: 08/09/2018 00:45  Patient seen and examined.  Clinical course and management discussed.  Necessary edits performed.  I agree with the above.  Assessment and plan of care developed and discussed below.    Assessment: 57 y.o. 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 MRI brain personally reviewed showed large infarction within the right posterior MCA distribution and advanced chronic microvascular ischemic changes, and multiple chronic infarcts in the brainstem. Prior TEE done on 4/19 did not show any significant abnormality, no evidence of PFO, significant valve disease or left atrial abnormalities. LDL -112. A1c 5.3.   Stroke Risk Factors - hyperlipidemia, hypertension, smoking and drug abuse (cocaine)  Plan: 1. Drug abuse counseling 2. Echocardiogram pending.  Will not likely need repeat TEE since patient reports smoking cocaine and not taking IV drugs therefore making likelihood of endocarditis low.   3. Carotid Ultrasound pending 4. PT consult, OT consult, Speech consult 5.  Continue medical management with dual therapy Aspirin 81 mg/day and Brilanta 90 mg /BID with intensive management of vascular risk factor to keep systolic BP (SBP) <140 mm Hg (841 mm Hg if diabetic) and low density lipoprotein (LDL) <70 mg/dl, and lifestyle modification. 6. Recommend increasing dose of high potency statin if patient has been compliant with medications prior to admission. 7. NPO until RN stroke swallow screen 8. Telemetry monitoring 9. Frequent neuro checks    This patient was staffed with Dr. Verlon Au, Thad Ranger who personally evaluated patient, reviewed documentation and agreed with assessment and plan of care as above.  Webb Silversmith, DNP, FNP-BC Board certified Nurse Practitioner Neurology Department  08/09/2018, 8:23 AM    Thana Farr, MD Neurology [phone]  08/09/2018  12:12 PM

## 2018-08-09 NOTE — ED Notes (Signed)
Hospitalist to bedside at this time 

## 2018-08-09 NOTE — Evaluation (Signed)
Occupational Therapy Evaluation Patient Details Name: Cory Harvey MRN: [id number] DOB: 09-19-1961 Today's Date: 08/09/2018    History of Present Illness Pt is a 57 y.o. male with a known history of cocaine use, CVA p/w acute weakness (generalized + L-sided) x1d.  He stated that he was lying on the floor unable to get up. He does not remember how he got there. He denies falling. He states that his friend visited him, and found him down on the ground. His friend called EMS. Pt stated that he felt weak all over, but he was especially weak on his L side. He has no complaints apart from weakness. He had stuttering speech, but stated this is old. He exhibits L-sided weakness, as well as some minimal L-sided neglect on exam. MRI brain performed in ED (+) "Large region of acute/early subacute infarction within the right posterior MCA distribution. No associated hemorrhage identified, however, the susceptibility sequences are motion degraded and petechial hemorrhage may not be visible. Minimal associated local mass effect."  Assessment includes: CVA and hypokalemia.   Clinical Impression   Pt seen for OT evaluation this date. Pt lives alone in a 1 story home with 6 steps to enter with bilateral handrails, tub shower. Pt was independent with mobility, ADL, and IADL prior to admission with no falls reported in past 12 months. Pt currently presents with L visual field deficits, L sided neglect, and L sided hemiplegia, including impaired sensation, coordination, strength, and tone. Pt demo'd difficulty following commands requiring additional cues. These impairments significantly impact the pt's functional mobility, safety, and ability to perform all ADL and IADL tasks at [facility]. Pt educated in symptoms and compensatory strategies for L homonymous hemianopsia including visual tracking. During self feeding task with OT on L side of pt, pt required max verbal cues for head turn to L, reach and grasp the spoon using  his R non-dominant hand while crossing midline and bring to mouth. As task progressed, pt required slightly less cues for head turns. OT instructed pt in visual tracking exercise requiring pt to identify number of objects in all 4 quadrants, initially requiring max cues for head turns and approx 50% accurate on L side, improving to >75% accurary and moderate verbal cues for head turns as exercise progressed. Pt will benefit from skilled OT services to address noted impairments and significant functional deficits in all aspects of ADL and mobility in order to maximize return to PLOF, safety, and independence while minimizing falls risk and caregiver burden. Recommend STR upon discharge.    Follow Up Recommendations  SNF    Equipment Recommendations  Other (comment)(TBD)    Recommendations for Other Services       Precautions / Restrictions Precautions Precautions: Fall Restrictions Weight Bearing Restrictions: No      Mobility Bed Mobility                  Transfers                      Balance                                           ADL either performed or assessed with clinical judgement   ADL Overall ADL's : Needs assistance/impaired Eating/Feeding: Bed level;Maximal assistance;Set up Eating/Feeding Details (indicate cue type and reason): Pt requires set up of all containers/packets, using R  non-dom hand, pt demo'd difficulty scooping ice cream with a spoon, requiring assist from therapist to scoop and hand pt the spoon, which he was able to do with cues to attend to L/midline where spoon was Grooming: Bed level;Moderate assistance;Maximal assistance Grooming Details (indicate cue type and reason): pt able to comb his R side of his head with set up of comb using R non-dom hand, required tactile and verbal cues to attempt to comb L side of hair but pt noting it was "too hard" Upper Body Bathing: Bed level;Maximal assistance   Lower Body  Bathing: Bed level;Maximal assistance   Upper Body Dressing : Bed level;Maximal assistance   Lower Body Dressing: Bed level;Maximal assistance                       Vision Baseline Vision/History: No visual deficits Patient Visual Report: (pt verbalizes no complaints with vision) Vision Assessment?: Yes Ocular Range of Motion: Impaired-to be further tested in functional context Alignment/Gaze Preference: Gaze right Tracking/Visual Pursuits: Impaired - to be further tested in functional context;Requires cues, head turns, or add eye shifts to track Visual Fields: Left homonymous hemianopsia     Perception     Praxis      Pertinent Vitals/Pain Pain Assessment: Faces Faces Pain Scale: Hurts a little bit Pain Location: L elbow with movement Pain Descriptors / Indicators: Grimacing Pain Intervention(s): Limited activity within patient's tolerance;Monitored during session;Repositioned     Hand Dominance Left   Extremity/Trunk Assessment Upper Extremity Assessment Upper Extremity Assessment: LUE deficits/detail(RUE WFL) LUE Deficits / Details: Pt with minimal voluntary movement of LUE, increased tone noted at end range of motion, sensation appears to be more impaired distally, when pt yawns, LUE noted to involuntarily go into flexion at all joints LUE Sensation: decreased proprioception;decreased light touch LUE Coordination: decreased fine motor;decreased gross motor   Lower Extremity Assessment Lower Extremity Assessment: Generalized weakness;LLE deficits/detail;Defer to PT evaluation LLE Deficits / Details: L hip flex 2/5, minimal voluntary movement noted with ankle DF/PF, trace knee flex and ext, difficult to determine sensation with pt providing inconsistent responses LLE Sensation: decreased light touch       Communication Communication Communication: Expressive difficulties   Cognition Arousal/Alertness: Awake/alert Behavior During Therapy: WFL for tasks  assessed/performed Overall Cognitive Status: No family/caregiver present to determine baseline cognitive functioning                                 General Comments: Pt required extensive verbal and tactile cues for following commands and was not a consistent historian. Mildly impaired problem solving skills during self feeding task using non-dominant hand   General Comments       Exercises Other Exercises Other Exercises: During self feeding task, OT on L side of pt, providing max verbal cues for head turn and "finding" the spoon with ice cream and reaching crossing midline with his R non-dominant hand to grasp spoon and bring to mouth. As task progressed, pt required slightly less cues for head turns Other Exercises: Visual tracking exercises requiring pt to identify number of objects in all 4 quadrants, initially requiring max cues for head turns and approx 50% accurate on L side, improving to >75% accurary and moderate verbal cues for head turns as exercise progressed   Shoulder Instructions      Home Living Family/patient expects to be discharged to:: Private residence(History from both the pt and from pervious  admission with pt stating no living situation changes since then) Living Arrangements: Alone Available Help at Discharge: Available PRN/intermittently;Family Type of Home: House Home Access: Stairs to enter Secretary/administrator of Steps: 6 Entrance Stairs-Rails: Can reach both;Left;Right Home Layout: One level     Bathroom Shower/Tub: Chief Strategy Officer: Standard     Home Equipment: None          Prior Functioning/Environment Level of Independence: Independent        Comments: Pt reports Ind amb without AD, Ind with ADLs, "I have never fallen"; brother checks in on pt intermittently        OT Problem List: Decreased strength;Decreased knowledge of use of DME or AE;Impaired tone;Decreased coordination;Decreased range of  motion;Decreased cognition;Impaired UE functional use;Impaired sensation;Impaired balance (sitting and/or standing)      OT Treatment/Interventions: Self-care/ADL training;Balance training;Therapeutic exercise;Therapeutic activities;Neuromuscular education;Cognitive remediation/compensation;DME and/or AE instruction;Patient/family education;Visual/perceptual remediation/compensation    OT Goals(Current goals can be found in the care plan section) Acute Rehab OT Goals Patient Stated Goal: To get stronger OT Goal Formulation: With patient Time For Goal Achievement: 08/23/18 Potential to Achieve Goals: Good ADL Goals Pt Will Perform Eating: sitting;with min assist;with set-up(with minimal cues for head turns to locate items on tray) Pt Will Perform Grooming: sitting;with min assist(min cues for attending to L side of face and underarm for grooming) Pt Will Transfer to Toilet: with mod assist;with +2 assist;stand pivot transfer;bedside commode Additional ADL Goal #1: Pt will use compensatory strategies including head turns to identify and locate items on L side during table top ADL tasks with min verbal cues to initiate.  OT Frequency: Min 3X/week   Barriers to D/C:            Co-evaluation              AM-PAC PT "6 Clicks" Daily Activity     Outcome Measure Help from another person eating meals?: A Lot Help from another person taking care of personal grooming?: A Lot Help from another person toileting, which includes using toliet, bedpan, or urinal?: Total Help from another person bathing (including washing, rinsing, drying)?: A Lot Help from another person to put on and taking off regular upper body clothing?: A Lot Help from another person to put on and taking off regular lower body clothing?: A Lot 6 Click Score: 11   End of Session    Activity Tolerance: Patient tolerated treatment well Patient left: in bed;with call bell/phone within reach;with bed alarm set  OT Visit  Diagnosis: Other abnormalities of gait and mobility (R26.89);Hemiplegia and hemiparesis;Low vision, both eyes (H54.2) Hemiplegia - Right/Left: Left Hemiplegia - dominant/non-dominant: Dominant Hemiplegia - caused by: Cerebral infarction                Time: 1410-1453 OT Time Calculation (min): 43 min Charges:  OT General Charges $OT Visit: 1 Visit OT Evaluation $OT Eval High Complexity: 1 High OT Treatments $Self Care/Home Management : 8-22 mins $Neuromuscular Re-education: 8-22 mins  Richrd Prime, MPH, MS, OTR/L ascom [phone] 08/09/18, 3:59 PM

## 2018-08-09 NOTE — ED Provider Notes (Signed)
-----------------------------------------   1:11 AM on 08/09/2018 -----------------------------------------  MRI discussed with Dr. Harrie JeansStratton: 1. Large region of acute/early subacute infarction within the right  posterior MCA distribution. No associated hemorrhage identified,  however, the susceptibility sequences are motion degraded and  petechial hemorrhage may not be visible. Minimal associated local  mass effect.  2. Stable background of advanced chronic microvascular ischemic  changes, volume loss of the brain, and multiple small chronic  infarcts in basal ganglia as well as pons.   Updated patient on MRI results.  States he does not take anticoagulants.  Will obtain swallow study in order aspirin.  Will discuss with hospitalist to evaluate patient in the emergency department for admission.   [name], Jade J, MD 08/09/18 [phone]

## 2018-08-09 NOTE — Progress Notes (Addendum)
SLP Cancellation Note  Patient Details Name: Cory Harvey MRN: [id number] DOB: 07-05-1961   Cancelled treatment:       Reason Eval/Treat Not Completed: Patient not medically ready(chart reviewed; consulted NSG re: pt's status). Per MD note, pt admitted w/ acute stroke but also Substance abuse-positive for cocaine in the urine again. Pt has a h/o stroke [facility] therapy. He completed some therapy at [facility](cut short d/t pt wanting to "go home") w/ last MOCA-B score of 16/30 indicating moderate Cognitive decline. Pt was recommended to have 24/7 supervision upon a repeat assessment ~2 months later when he admitted to [facility]. Due to this new admission of stroke - right posterior MCA distribution (and positive for Cocaine and ETOH, recommend pt have skilled ST f/u at discharge to SNF(per PT recs.) for a more formal Cognitive assessment in regard to addressing ADLs and goals of care.  Upon talking to NSG, pt is able to functionally make basic wants/needs known currently per report. Pt is also tolerating his current diet ordered by MD at admission w/ no overt s/s of aspiration noted. Recommend monitoring at meals for assistance d/t Cognitive decline baseline.  ST services will be available for any further education while admitted.     Jerilynn SomKatherine Fany Cavanaugh, MS, CCC-SLP [place] 08/09/2018, 2:56 PM

## 2018-08-09 NOTE — Progress Notes (Signed)
[employer] [facility] [facility] [place] [facility]   PATIENT NAME: Cory Harvey    MR#:  [id number]  DATE OF BIRTH:  11/06/1961  SUBJECTIVE:  CHIEF COMPLAINT:   Chief Complaint  Patient presents with  . Weakness   -Admitted with stroke.  Has chronic stuttered speech. -New left arm weakness and left-sided hemi-neglect  REVIEW OF SYSTEMS:  Review of Systems  Constitutional: Negative for chills, fever and malaise/fatigue.  HENT: Negative for ear discharge, hearing loss and nosebleeds.   Eyes: Negative for blurred vision and double vision.  Respiratory: Negative for cough, shortness of breath and wheezing.   Cardiovascular: Negative for chest pain and palpitations.  Gastrointestinal: Negative for abdominal pain, constipation, diarrhea, nausea and vomiting.  Genitourinary: Negative for dysuria.  Musculoskeletal: Negative for myalgias.  Neurological: Positive for sensory change, speech change and focal weakness. Negative for dizziness, seizures and headaches.  Psychiatric/Behavioral: Negative for depression.    DRUG ALLERGIES:  No Known Allergies  VITALS:  Blood pressure (!) 112/91, pulse 75, temperature 99.3 F (37.4 C), temperature source Oral, resp. rate 18, height 5\' 8"  (1.727 m), weight 71.3 kg, SpO2 98 %.  PHYSICAL EXAMINATION:  Physical Exam  GENERAL:  57 y.o.-year-old patient lying in the bed with no acute distress.  EYES: Pupils equal, round, reactive to light and accommodation. No scleral icterus. Extraocular muscles intact.  HEENT: Head atraumatic, normocephalic. Oropharynx and nasopharynx clear.  NECK:  Supple, no jugular venous distention. No thyroid enlargement, no tenderness.  LUNGS: Normal breath sounds bilaterally, no wheezing, rales,rhonchi or crepitation. No use of accessory muscles of respiration.  CARDIOVASCULAR: S1, S2 normal. No murmurs, rubs, or gallops.  ABDOMEN: Soft, nontender, nondistended. Bowel sounds present. No organomegaly or mass.    EXTREMITIES: No pedal edema, cyanosis, or clubbing.  NEUROLOGIC: Cranial nerves II through XII are intact.  Left-sided hemineglect noted.  Bilateral lower extremity strength is 3/5-weaker in the proximal muscles, left upper extremities 0/5 whereas right lower extremities 5 x 5.. Sensation intact. Gait not checked.  PSYCHIATRIC: The patient is alert and oriented x 3.  SKIN: No obvious rash, lesion, or ulcer.    LABORATORY PANEL:   CBC Recent Labs  Lab 08/08/18 1900  WBC 10.6  HGB 15.2  HCT 45.3  PLT 358   ------------------------------------------------------------------------------------------------------------------  Chemistries  Recent Labs  Lab 08/08/18 1900 08/09/18 0311  NA 142  --   K 3.4*  --   CL 111  --   CO2 23  --   GLUCOSE 88  --   BUN 10  --   CREATININE 1.13  --   CALCIUM 9.4  --   MG  --  2.1  AST 22  --   ALT 15  --   ALKPHOS 51  --   BILITOT 1.1  --    ------------------------------------------------------------------------------------------------------------------  Cardiac Enzymes No results for input(s): TROPONINI in the last 168 hours. ------------------------------------------------------------------------------------------------------------------  RADIOLOGY:  Dg Eye Foreign Body  Result Date: 08/08/2018 CLINICAL DATA:  Metal working/exposure; clearance prior to MRI EXAM: ORBITS FOR FOREIGN BODY - 2 VIEW COMPARISON:  Head CT dated 08/08/2018 FINDINGS: High attenuating densities noted over the right orbit corresponding to the right MCA stent seen on the earlier CT. No additional radiopaque foreign object identified. IMPRESSION: Right MCA stent. Electronically Signed   By: Elgie Collard M.D.   On: 08/08/2018 22:04   Dg Chest 1 View  Result Date: 08/08/2018 CLINICAL DATA:  Initial evaluation for generalized weakness. EXAM: CHEST  1 VIEW  COMPARISON:  Prior radiograph from 05/25/2018. FINDINGS: Mild cardiomegaly, stable. Mediastinal silhouette  normal. Tortuosity the intrathoracic aorta noted. Lungs mildly hypoinflated. No focal infiltrates. Mild diffuse interstitial prominence with perihilar vascular congestion without frank alveolar edema. No pleural effusion. No pneumothorax. No acute osseus abnormality. IMPRESSION: Cardiomegaly with mild diffuse pulmonary interstitial congestion without frank alveolar edema. Electronically Signed   By: Rise Mu M.D.   On: 08/08/2018 22:05   Dg Abd 1 View  Result Date: 08/08/2018 CLINICAL DATA:  Initial evaluation for generalized weakness. EXAM: ABDOMEN - 1 VIEW COMPARISON:  None. FINDINGS: Bowel gas pattern within normal limits without obstruction or ileus. No abnormal bowel wall thickening. No appreciable free air on these limited supine views. No soft tissue mass or abnormal calcification. Mild degenerative changes noted within lower lumbar spine. IMPRESSION: Nonobstructive bowel gas pattern with no radiographic evidence for acute intra-abdominal process. Electronically Signed   By: Rise Mu M.D.   On: 08/08/2018 22:07   Ct Head Wo Contrast  Result Date: 08/08/2018 CLINICAL DATA:  Generalized weakness EXAM: CT HEAD WITHOUT CONTRAST TECHNIQUE: Contiguous axial images were obtained from the base of the skull through the vertex without intravenous contrast. COMPARISON:  07/04/2018 MRI FINDINGS: Brain: Chronic small vessel disease throughout the deep white matter. Mild cerebral atrophy. No acute intracranial abnormality. Specifically, no hemorrhage, hydrocephalus, mass lesion, acute infarction, or significant intracranial injury. Vascular: Stents noted within the right middle cerebral artery. Skull: No acute calvarial abnormality. Sinuses/Orbits: Visualized paranasal sinuses and mastoids clear. Orbital soft tissues unremarkable. Other: None IMPRESSION: No acute intracranial abnormality. Atrophy, chronic microvascular disease. Electronically Signed   By: Charlett Nose M.D.   On: 08/08/2018  19:44   [name]  Result Date: 08/09/2018 CLINICAL DATA:  57 y/o M; gaze preference to the right. History of CVA and cocaine use. EXAM: MRI HEAD WITHOUT CONTRAST TECHNIQUE: Multiplanar, multiecho pulse sequences of the brain and surrounding structures were obtained without intravenous contrast. COMPARISON:  07/04/2018 MRI of the head.  08/08/2018 CT head. FINDINGS: Brain: Large region of patchy reduced diffusion throughout the right posterior frontal lobe, posterior temporal lobe, parietal lobe, and the posterior insula compatible with acute/early subacute infarction. The region of infarction is associated with minimal T2 FLAIR hyperintense signal abnormality and local mass effect. No new susceptibility hypointensity to indicate intracranial hemorrhage, however, the susceptibility sequences motion degraded in small hemorrhage may not be visible. Stable hemosiderin deposition from chronic hemorrhage in the right posterior frontal lobe. Stable small chronic infarctions in the right hemi pons and scattered throughout the basal ganglia greatest in the left thalamus and right caudate head. Stable advanced chronic microvascular ischemic changes and volume loss of the brain. No extra-axial collection, hydrocephalus, or herniation. Vascular: Stable susceptibility blooming from right middle cerebral artery stents in the MCA cistern and the sylvian fissure. Skull and upper cervical spine: Normal marrow signal. Sinuses/Orbits: Negative. Other: None. IMPRESSION: 1. Large region of acute/early subacute infarction within the right posterior MCA distribution. No associated hemorrhage identified, however, the susceptibility sequences are motion degraded and petechial hemorrhage may not be visible. Minimal associated local mass effect. 2. Stable background of advanced chronic microvascular ischemic changes, volume loss of the brain, and multiple small chronic infarcts in basal ganglia as well as pons. These results  were called by telephone at the time of interpretation on 08/09/2018 at 12:41 am to Dr. Dolores Frame, who verbally acknowledged these results. Electronically Signed   By: Mitzi Hansen M.D.   On: 08/09/2018 00:45  [place] Carotid Bilateral (at [facility] And [facility] Only)  Result Date: 08/09/2018 CLINICAL DATA:  57 year old male with acute right posterior MCA infarct. EXAM: BILATERAL CAROTID DUPLEX ULTRASOUND TECHNIQUE: Wallace CullensGray scale imaging, color Doppler and duplex ultrasound were performed of bilateral carotid and vertebral arteries in the neck. COMPARISON:  Brain MRI 08/08/2018; prior carotid duplex ultrasound 09/02/2017 FINDINGS: Criteria: Quantification of carotid stenosis is based on velocity parameters that correlate the residual internal carotid diameter with NASCET-based stenosis levels, using the diameter of the distal internal carotid lumen as the denominator for stenosis measurement. The following velocity measurements were obtained: RIGHT ICA: 51/18 cm/sec CCA: 51/13 cm/sec SYSTOLIC ICA/CCA RATIO:  1.0 ECA:  74 cm/sec LEFT ICA: 45/18 cm/sec CCA: 61/15 cm/sec SYSTOLIC ICA/CCA RATIO:  0.7 ECA:  73 cm/sec RIGHT CAROTID ARTERY: No significant atherosclerotic plaque or evidence of stenosis. RIGHT VERTEBRAL ARTERY:  Patent with antegrade flow. LEFT CAROTID ARTERY: No significant atherosclerotic plaque or evidence of stenosis. LEFT VERTEBRAL ARTERY:  Patent with antegrade flow. IMPRESSION: Stable duplex carotid ultrasound examination without evidence of significant atherosclerotic plaque, hemodynamically significant stenosis or occlusion of either internal carotid artery. Vertebral arteries remain patent with antegrade flow. Electronically Signed   By: Malachy MoanHeath  McCullough M.D.   On: 08/09/2018 09:32    EKG:   Orders placed or performed during the hospital encounter of 08/08/18  . ED EKG  . ED EKG  . EKG 12-Lead  . EKG 12-Lead    ASSESSMENT AND PLAN:   57 year old male with past medical history significant for  substance abuse with cocaine and alcohol, prior history of strokes with no residual neurological deficits presents to hospital secondary to left-sided weakness and left-sided hemineglect  1.  Acute stroke-MRI confirming a large area of acute infarction in posterior MCA distribution. -Significant weakness of his left side, unable to move his left arm today. -Neuro consult, neurochecks -Currently on aspirin and Brilinta-suspect compliance with his medications -On statin -PT/OT consults. -Carotid Dopplers with no hemodynamically significant stenosis.  Echocardiogram is pending  2.  Hypertension-on losartan  3.  Hypokalemia-being replaced  4.  Substance abuse-positive for cocaine in the urine again.  Significantly counseled against substance abuse  5.  DVT prophylaxis-Lovenox    All the records are reviewed and case discussed with Care Management/Social Workerr. Management plans discussed with the patient, family and they are in agreement.  CODE STATUS: Full code  TOTAL TIME TAKING CARE OF THIS PATIENT: 37 minutes.   POSSIBLE D/C IN 2 DAYS, DEPENDING ON CLINICAL CONDITION.   Daiel Strohecker M.D on 08/09/2018 at 12:19 PM  Between 7am to 6pm - Pager - [phone]  After 6pm go to [email or web address] - Social research officer, governmentpassword EPAS [facility]  [facility]  Office  [phone]  CC: Primary care physician; Marisue IvanLinthavong, [place], MD

## 2018-08-09 NOTE — Clinical Social Work Note (Signed)
Clinical Social Work Assessment  Patient Details  Name: Cory Harvey MRN: [id number] Date of Birth: 1961-08-03  Date of referral:  08/09/18               Reason for consult:  Facility Placement, Substance Use/ETOH Abuse                Permission sought to share information with:    Permission granted to share information::     Name::     Waymon BudgeSmith, Jessie Mother   Agency::  SNF admissions  Relationship::     Contact Information:     Housing/Transportation Living arrangements for the past 2 months:  Apartment Source of Information:  Patient, Medical Team Patient Interpreter Needed:  None Criminal Activity/Legal Involvement Pertinent to Current Situation/Hospitalization:  No - Comment as needed Significant Relationships:  Parents Lives with:  Self Do you feel safe going back to the place where you live?  No Need for family participation in patient care:  No (Coment)  Care giving concerns: Patient feels he needs some short term rehab before he is able to return back home.  Social Worker assessment / plan:  Patient is a 57 year old male who is alert and oriented x3.  Patient states he has been to rehab before, CSW explained role of CSW and process for looking for placement for patient.  CSW spoke to patient regarding his cocaine use, patient states he does use, and is hoping he can work towards not using again.  Patient states that he lives alone, and does not have any other family members that he speaks to except his mom.  Patient has history of depression and also substance abuse.  Patient was explained how insurance will pay for stay if he is approved for SNF.  Patient gave CSW permission to begin bed search in [place].  Patient is a level 2 passar manual screen due to depression and substance abuse.  Patient did not express any other questions or concerns.  Employment status:  Disabled (Comment on whether or not currently receiving Disability) Insurance information:  Managed  Medicare PT Recommendations:  Skilled Nursing Facility Information / Referral to community resources:  Skilled Nursing Facility  Patient/Family's Response to care:  Patient is in agreement to going to SNF for short term rehab.  Patient/Family's Understanding of and Emotional Response to Diagnosis, Current Treatment, and Prognosis:  Patient is hopeful that he will not have to be at SNF for very long.  Emotional Assessment Appearance:  Appears stated age Attitude/Demeanor/Rapport:    Affect (typically observed):  Stable, Appropriate Orientation:  Oriented to Self, Oriented to Place, Oriented to Situation Alcohol / Substance use:  Illicit Drugs Psych involvement (Current and /or in the community):  Yes (Comment)  Discharge Needs  Concerns to be addressed:  Substance Abuse Concerns, Lack of Support, Care Coordination Readmission within the last 30 days:  No Current discharge risk:  Lack of support system, Substance Abuse, Lives alone Barriers to Discharge:  Continued Medical Work up, Active Substance Use   [place] [employer], Hendrik Donath R, LCSWA 08/09/2018, 6:32 PM

## 2018-08-09 NOTE — Evaluation (Signed)
Physical Therapy Evaluation Patient Details Name: Cory Harvey MRN: [id number] DOB: Sep 15, 1961 Today's Date: 08/09/2018   History of Present Illness  Pt is a 57 y.o. male with a known history of cocaine use, CVA p/w acute weakness (generalized + L-sided) x1d.  He stated that he was lying on the floor unable to get up. He does not remember how he got there. He denies falling. He states that his friend visited him, and found him down on the ground. His friend called EMS. Pt stated that he felt weak all over, but he was especially weak on his L side. He has no complaints apart from weakness. He had stuttering speech, but stated this is old. He exhibits L-sided weakness, as well as some minimal L-sided neglect on exam. MRI brain performed in ED (+) "Large region of acute/early subacute infarction within the right posterior MCA distribution. No associated hemorrhage identified, however, the susceptibility sequences are motion degraded and petechial hemorrhage may not be visible. Minimal associated local mass effect."  Assessment includes: CVA and hypokalemia.    Clinical Impression  Pt presents with deficits in strength, transfers, mobility, gait, balance, and activity tolerance.  Pt presented with some voluntary L hip flex with strength 2/5 but had no visible L ankle DF or PF and only trace L knee flex and ext.  Pt also showed almost no ability to move his LUE except with compensation from his trapezius muscles. Pt presented with L-sided neglect and required extensive cues to find objects on his left side or to attempt to use his LUE or LLE.  Pt required new +2 total assist with bed mobility tasks along with extensive verbal and tactile cues for sequencing.  Pt was able to stand from an elevated EOB with +2 HHA/Mod A.  In both sitting and standing pt presented with L lateral lean which was more pronounced in standing.  Pt required constant physical assistance in standing to prevent L lateral/posterior LOB  but pt was able to take 2-3 very small shuffling steps at the EOB but was never able to clear his L foot from the floor surface.  Pt will benefit from PT services in a SNF setting upon discharge to safely address above deficits for decreased caregiver assistance and eventual return to PLOF.      Follow Up Recommendations SNF;Supervision for mobility/OOB    Equipment Recommendations  Other (comment)(TBD at next venue of care)    Recommendations for Other Services       Precautions / Restrictions Precautions Precautions: Fall Restrictions Weight Bearing Restrictions: No      Mobility  Bed Mobility Overal bed mobility: Needs Assistance Bed Mobility: Supine to Sit;Sit to Supine     Supine to sit: +2 for physical assistance;Max assist Sit to supine: +2 for physical assistance;Max assist   General bed mobility comments: Pt near total assist with bed mobility tasks  Transfers Overall transfer level: Needs assistance Equipment used: 2 person hand held assist Transfers: Sit to/from Stand Sit to Stand: +2 physical assistance;+2 safety/equipment;Mod assist         General transfer comment: Max verbal cues for sequencing with pt leaning to the Left upon standing and requiring continuous Mod A to prevent LOB  Ambulation/Gait Ambulation/Gait assistance: +2 physical assistance;Mod assist Gait Distance (Feet): 1 Feet Assistive device: 2 person hand held assist Gait Pattern/deviations: Step-to pattern;Shuffle     General Gait Details: Pt able to take 2-3 very small shuffling steps at the EOB with poor stability and with  difficulty advancing the LLE which never cleared the floor  Stairs            Wheelchair Mobility    Modified Rankin (Stroke Patients Only)       Balance Overall balance assessment: Needs assistance Sitting-balance support: Single extremity supported;Feet supported Sitting balance-Leahy Scale: Poor Sitting balance - Comments: Pt presented with L  lateral lean in sitting that improved somewhat with practice and cues for weight shifting to the R Postural control: Left lateral lean Standing balance support: Bilateral upper extremity supported Standing balance-Leahy Scale: Poor Standing balance comment: Left lateral lean with pt requiring constant physical assistance to prevent LOB                             Pertinent Vitals/Pain Pain Assessment: No/denies pain    Home Living Family/patient expects to be discharged to:: Private residence(History from both the pt and from pervious admission with pt stating no living situation changes since then) Living Arrangements: Alone Available Help at Discharge: Available PRN/intermittently;Family Type of Home: House Home Access: Stairs to enter Entrance Stairs-Rails: Can reach both;Left;Right Entrance Stairs-Number of Steps: 6 Home Layout: One level Home Equipment: None      Prior Function Level of Independence: Independent         Comments: Pt reports Ind amb without AD, Ind with ADLs, "I have never fallen"; brother checks in on pt intermittently     Hand Dominance   Dominant Hand: Left    Extremity/Trunk Assessment   Upper Extremity Assessment Upper Extremity Assessment: LUE deficits/detail;Defer to OT evaluation LUE Deficits / Details: Minimal voluntary movement of the LUE noted during session    Lower Extremity Assessment Lower Extremity Assessment: Generalized weakness;LLE deficits/detail LLE Deficits / Details: L hip flex 2/5, no voluntary movement noted with ankle DF/PF, trace knee flex and ext       Communication   Communication: Expressive difficulties  Cognition Arousal/Alertness: Lethargic Behavior During Therapy: WFL for tasks assessed/performed Overall Cognitive Status: No family/caregiver present to determine baseline cognitive functioning                                 General Comments: Pt required extensive verbal and tactile  cues for following commands and was not a consistent historian      General Comments      Exercises Total Joint Exercises Ankle Circles/Pumps: AROM;Both;10 reps;15 reps;PROM(PROM on the left) Heel Slides: AROM;AAROM;5 reps;Both;10 reps(AAROM on the left) Long Arc Quad: AROM;Both;10 reps(Trace on the LLE) Knee Flexion: AROM;Both;10 reps(Trace on the LLE) Other Exercises Other Exercises: Static sitting balance and core training with cues for improved stability/posture   Assessment/Plan    PT Assessment Patient needs continued PT services  PT Problem List Decreased strength;Decreased activity tolerance;Decreased balance;Decreased knowledge of use of DME;Decreased mobility       PT Treatment Interventions DME instruction;Gait training;Functional mobility training;Balance training;Neuromuscular re-education;Therapeutic exercise;Therapeutic activities;Patient/family education    PT Goals (Current goals can be found in the Care Plan section)  Acute Rehab PT Goals Patient Stated Goal: To get stronger PT Goal Formulation: With patient Time For Goal Achievement: 08/22/18 Potential to Achieve Goals: Fair    Frequency 7X/week   Barriers to discharge Inaccessible home environment;Decreased caregiver support      Co-evaluation               AM-PAC PT "6 Clicks" Daily Activity  Outcome Measure Difficulty turning over in bed (including adjusting bedclothes, sheets and blankets)?: Unable Difficulty moving from lying on back to sitting on the side of the bed? : Unable Difficulty sitting down on and standing up from a chair with arms (e.g., wheelchair, bedside commode, etc,.)?: Unable Help needed moving to and from a bed to chair (including a wheelchair)?: A Lot Help needed walking in hospital room?: Total Help needed climbing 3-5 steps with a railing? : Total 6 Click Score: 7    End of Session Equipment Utilized During Treatment: Gait belt Activity Tolerance: Patient tolerated  treatment well Patient left: in bed;with bed alarm set;with family/visitor present;with call bell/phone within reach Nurse Communication: Mobility status PT Visit Diagnosis: Unsteadiness on feet (R26.81);Muscle weakness (generalized) (M62.81);Difficulty in walking, not elsewhere classified (R26.2);Hemiplegia and hemiparesis Hemiplegia - Right/Left: Left Hemiplegia - dominant/non-dominant: Dominant Hemiplegia - caused by: Unspecified    Time: 1001-1030 PT Time Calculation (min) (ACUTE ONLY): 29 min   Charges:   PT Evaluation $PT Eval Moderate Complexity: 1 Mod PT Treatments $Therapeutic Exercise: 8-22 mins        D. Scott Yerik Zeringue PT, DPT 08/09/18, 11:26 AM

## 2018-08-10 LAB — BASIC METABOLIC PANEL
ANION GAP: 5 (ref 5–15)
BUN: 7 mg/dL (ref 6–20)
CALCIUM: 8.5 mg/dL — AB (ref 8.9–10.3)
CO2: 25 mmol/L (ref 22–32)
CREATININE: 0.88 mg/dL (ref 0.61–1.24)
Chloride: 108 mmol/L (ref 98–111)
GFR calc non Af Amer: >60 mL/min (ref >60)
Glucose, Bld: 102 mg/dL — ABNORMAL HIGH (ref 70–99)
Potassium: 3.4 mmol/L — ABNORMAL LOW (ref 3.5–5.1)
SODIUM: 138 mmol/L (ref 135–145)

## 2018-08-10 MED ORDER — POTASSIUM CHLORIDE CRYS ER 20 MEQ PO TBCR
40.0000 meq | EXTENDED_RELEASE_TABLET | Freq: Once | ORAL | Status: AC
  Administered 2018-08-10: 11:00:00 40 meq via ORAL
  Filled 2018-08-10: qty 2

## 2018-08-10 NOTE — Progress Notes (Signed)
Occupational Therapy Treatment Patient Details Name: Cory Harvey Subia MRN: [id number] DOB: [birth year] Today's Date: 08/10/2018    History of present illness Pt is a 57 y.o. male with a known history of cocaine use, CVA p/w acute weakness (generalized + L-sided) x1d.  He stated that he was lying on the floor unable to get up. He does not remember how he got there. He denies falling. He states that his friend visited him, and found him down on the ground. His friend called EMS. Pt stated that he felt weak all over, but he was especially weak on his L side. He has no complaints apart from weakness. He had stuttering speech, but stated this is old. He exhibits L-sided weakness, as well as some minimal L-sided neglect on exam. MRI brain performed in ED (+) "Large region of acute/early subacute infarction within the right posterior MCA distribution. No associated hemorrhage identified, however, the susceptibility sequences are motion degraded and petechial hemorrhage may not be visible. Minimal associated local mass effect."  Assessment includes: CVA and hypokalemia.   OT comments  Pt seen for OT tx this date. Pt instructed in neuro re-ed for LUE ROM and visual attention to L side. Pt continues with R gaze preference but requiring slightly decreased cues to turn head to L to compensate for visual loss on L side. Pt demo'd improved attention to L side as activity progressed and also demonstrates improved AROM of LUE and LLE this date as compared to previous date (please see details below). Pt continues to benefit from skilled OT services to maximize return to PLOF and minimize risk of increased caregiver burden or higher level of care long term. Will continue to progress. STR remains appropriate.    Follow Up Recommendations  SNF    Equipment Recommendations  Other (comment)(TBD)    Recommendations for Other Services      Precautions / Restrictions Precautions Precautions: Fall Restrictions Weight  Bearing Restrictions: No       Mobility Bed Mobility  Transfers                      Balance                                           ADL either performed or assessed with clinical judgement   ADL Overall ADL's : Needs assistance/impaired                                             Vision Baseline Vision/History: No visual deficits Patient Visual Report: (pt verbalizes no complaints with vision) Vision Assessment?: Yes Ocular Range of Motion: Impaired-to be further tested in functional context Alignment/Gaze Preference: Gaze right Tracking/Visual Pursuits: Impaired - to be further tested in functional context;Requires cues, head turns, or add eye shifts to track Visual Fields: Left homonymous hemianopsia   Perception     Praxis      Cognition Arousal/Alertness: Awake/alert Behavior During Therapy: WFL for tasks assessed/performed Overall Cognitive Status: No family/caregiver present to determine baseline cognitive functioning  Exercises Other Exercises Other Exercises: Visual tracking exercises requiring pt to identify number of objects in all 4 quadrants, initially requiring mod-max cues for head turns and approx 70% accurate on L side, improving to 80% accurary and min-mod verbal cues for head turns as exercise progressed Other Exercises: Pt instructed in BUE AAROM/self ROM ex crossing midline requiring initial hand over hand and pt able to return demo with min assist for LUE and cues to continue. Pt demonstrating improved AROM with LUE and LLE this date, able to lift LUE and LLE off bed briefly   Shoulder Instructions       General Comments      Pertinent Vitals/ Pain       Pain Assessment: Faces Faces Pain Scale: Hurts little more Pain Location: L shoulder with passive movement to place LUE on pillow and R shoulder with AROM (pt states this is chronic) Pain  Descriptors / Indicators: Grimacing Pain Intervention(s): Limited activity within patient's tolerance;Monitored during session;Repositioned  Home Living                                          Prior Functioning/Environment              Frequency  Min 3X/week        Progress Toward Goals  OT Goals(current goals can now be found in the care plan section)  Progress towards OT goals: Progressing toward goals  Acute Rehab OT Goals Patient Stated Goal: To get stronger OT Goal Formulation: With patient Time For Goal Achievement: 08/23/18 Potential to Achieve Goals: Good  Plan Discharge plan remains appropriate;Frequency remains appropriate    Co-evaluation                 AM-PAC PT "6 Clicks" Daily Activity     Outcome Measure   Help from another person eating meals?: A Lot Help from another person taking care of personal grooming?: A Lot Help from another person toileting, which includes using toliet, bedpan, or urinal?: Total Help from another person bathing (including washing, rinsing, drying)?: A Lot Help from another person to put on and taking off regular upper body clothing?: A Lot Help from another person to put on and taking off regular lower body clothing?: A Lot 6 Click Score: 11    End of Session    OT Visit Diagnosis: Other abnormalities of gait and mobility (R26.89);Hemiplegia and hemiparesis;Low vision, both eyes (H54.2) Hemiplegia - Right/Left: Left Hemiplegia - dominant/non-dominant: Dominant Hemiplegia - caused by: Cerebral infarction   Activity Tolerance Patient tolerated treatment well   Patient Left in bed;with call bell/phone within reach;with bed alarm set   Nurse Communication          Time: 1610-9604 OT Time Calculation (min): 18 min  Charges: OT General Charges $OT Visit: 1 Visit OT Treatments $Neuromuscular Re-education: 8-22 mins  Richrd Prime, MPH, MS, OTR/L ascom [phone] 08/10/18, 12:20  PM

## 2018-08-10 NOTE — Progress Notes (Signed)
Subjective: Patient improved today.  No new neurological complaints.  Objective: Current vital signs: BP (!) 117/95 (BP Location: Left Arm)   Pulse 69   Temp (!) 97.5 F (36.4 C) (Axillary)   Resp 18   Ht 5\' 8"  (1.727 m)   Wt 71.3 kg   SpO2 97%   BMI 23.89 kg/m  Vital signs in last 24 hours: Temp:  [97.5 F (36.4 C)-98.2 F (36.8 C)] 97.5 F (36.4 C) (08/31 1232) Pulse Rate:  [62-86] 69 (08/31 1232) Resp:  [16-20] 18 (08/31 1232) BP: (117-130)/(74-99) 117/95 (08/31 1232) SpO2:  [97 %-100 %] 97 % (08/31 1232)  Intake/Output from previous day: 08/30 0701 - 08/31 0700 In: 1186.3 [P.O.:1080; I.V.:106.3] Out: 0  Intake/Output this shift: Total I/O In: 240 [P.O.:240] Out: -  Nutritional status:  Diet Order            Diet Heart Room service appropriate? Yes; Fluid consistency: Thin  Diet effective now              Neurologic Exam: Mental Status: Alert.  Repeats often.  Follows simple commands.  Left neglect Cranial Nerves: II: Counts fingers in all visual fields III,IV, VI: Does not cross midline to the left V,VII: smile symmetric, facial light touch sensation normal bilaterally VIII: hearing normal bilaterally IX,X: gag reflex present XI: bilateral shoulder shrug XII: midline tongue extension Motor: Moves right well.  3+/5 left upper and lower extremity strength   Lab Results: Basic Metabolic Panel: Recent Labs  Lab 08/08/18 1900 08/09/18 0311 08/10/18 0402  NA 142  --  138  K 3.4*  --  3.4*  CL 111  --  108  CO2 23  --  25  GLUCOSE 88  --  102*  BUN 10  --  7  CREATININE 1.13  --  0.88  CALCIUM 9.4  --  8.5*  MG  --  2.1  --     Liver Function Tests: Recent Labs  Lab 08/08/18 1900  AST 22  ALT 15  ALKPHOS 51  BILITOT 1.1  PROT 6.7  ALBUMIN 4.2   No results for input(s): LIPASE, AMYLASE in the last 168 hours. No results for input(s): AMMONIA in the last 168 hours.  CBC: Recent Labs  Lab 08/08/18 1900  WBC 10.6  NEUTROABS 9.4*   HGB 15.2  HCT 45.3  MCV 84.1  PLT 358    Cardiac Enzymes: No results for input(s): CKTOTAL, CKMB, CKMBINDEX, TROPONINI in the last 168 hours.  Lipid Panel: Recent Labs  Lab 08/09/18 0311  CHOL 170  TRIG 73  HDL 43  CHOLHDL 4.0  VLDL 15  LDLCALC 161*    CBG: Recent Labs  Lab 08/08/18 1930  GLUCAP 83    Microbiology: Results for orders placed or performed during the hospital encounter of 05/25/18  MRSA PCR Screening     Status: None   Collection Time: 05/26/18  6:00 AM  Result Value Ref Range Status   MRSA by PCR NEGATIVE NEGATIVE Final    Comment:        The GeneXpert MRSA Assay (FDA approved for NASAL specimens only), is one component of a comprehensive MRSA colonization surveillance program. It is not intended to diagnose MRSA infection nor to guide or monitor treatment for MRSA infections. Performed at [facility], [address], [place], [place] [address]     Coagulation Studies: Recent Labs    08/08/18 1900  LABPROT 13.6  INR 1.05    Imaging:  Dg Eye Foreign Body  Result Date: 08/08/2018 CLINICAL DATA:  Metal working/exposure; clearance prior to MRI EXAM: ORBITS FOR FOREIGN BODY - 2 VIEW COMPARISON:  Head CT dated 08/08/2018 FINDINGS: High attenuating densities noted over the right orbit corresponding to the right MCA stent seen on the earlier CT. No additional radiopaque foreign object identified. IMPRESSION: Right MCA stent. Electronically Signed   By: Elgie CollardArash  Radparvar M.D.   On: 08/08/2018 22:04   Dg Chest 1 View  Result Date: 08/08/2018 CLINICAL DATA:  Initial evaluation for generalized weakness. EXAM: CHEST  1 VIEW COMPARISON:  Prior radiograph from 05/25/2018. FINDINGS: Mild cardiomegaly, stable. Mediastinal silhouette normal. Tortuosity the intrathoracic aorta noted. Lungs mildly hypoinflated. No focal infiltrates. Mild diffuse interstitial prominence with perihilar vascular congestion without frank alveolar edema. No pleural  effusion. No pneumothorax. No acute osseus abnormality. IMPRESSION: Cardiomegaly with mild diffuse pulmonary interstitial congestion without frank alveolar edema. Electronically Signed   By: Rise MuBenjamin  McClintock M.D.   On: 08/08/2018 22:05   Dg Abd 1 View  Result Date: 08/08/2018 CLINICAL DATA:  Initial evaluation for generalized weakness. EXAM: ABDOMEN - 1 VIEW COMPARISON:  None. FINDINGS: Bowel gas pattern within normal limits without obstruction or ileus. No abnormal bowel wall thickening. No appreciable free air on these limited supine views. No soft tissue mass or abnormal calcification. Mild degenerative changes noted within lower lumbar spine. IMPRESSION: Nonobstructive bowel gas pattern with no radiographic evidence for acute intra-abdominal process. Electronically Signed   By: Rise MuBenjamin  McClintock M.D.   On: 08/08/2018 22:07   Ct Head Wo Contrast  Result Date: 08/08/2018 CLINICAL DATA:  Generalized weakness EXAM: CT HEAD WITHOUT CONTRAST TECHNIQUE: Contiguous axial images were obtained from the base of the skull through the vertex without intravenous contrast. COMPARISON:  07/04/2018 MRI FINDINGS: Brain: Chronic small vessel disease throughout the deep white matter. Mild cerebral atrophy. No acute intracranial abnormality. Specifically, no hemorrhage, hydrocephalus, mass lesion, acute infarction, or significant intracranial injury. Vascular: Stents noted within the right middle cerebral artery. Skull: No acute calvarial abnormality. Sinuses/Orbits: Visualized paranasal sinuses and mastoids clear. Orbital soft tissues unremarkable. Other: None IMPRESSION: No acute intracranial abnormality. Atrophy, chronic microvascular disease. Electronically Signed   By: Charlett NoseKevin  Dover M.D.   On: 08/08/2018 19:44   [name]  Result Date: 08/09/2018 CLINICAL DATA:  57 y/o M; gaze preference to the right. History of CVA and cocaine use. EXAM: MRI HEAD WITHOUT CONTRAST TECHNIQUE: Multiplanar, multiecho  pulse sequences of the brain and surrounding structures were obtained without intravenous contrast. COMPARISON:  07/04/2018 MRI of the head.  08/08/2018 CT head. FINDINGS: Brain: Large region of patchy reduced diffusion throughout the right posterior frontal lobe, posterior temporal lobe, parietal lobe, and the posterior insula compatible with acute/early subacute infarction. The region of infarction is associated with minimal T2 FLAIR hyperintense signal abnormality and local mass effect. No new susceptibility hypointensity to indicate intracranial hemorrhage, however, the susceptibility sequences motion degraded in small hemorrhage may not be visible. Stable hemosiderin deposition from chronic hemorrhage in the right posterior frontal lobe. Stable small chronic infarctions in the right hemi pons and scattered throughout the basal ganglia greatest in the left thalamus and right caudate head. Stable advanced chronic microvascular ischemic changes and volume loss of the brain. No extra-axial collection, hydrocephalus, or herniation. Vascular: Stable susceptibility blooming from right middle cerebral artery stents in the MCA cistern and the sylvian fissure. Skull and upper cervical spine: Normal marrow signal. Sinuses/Orbits: Negative. Other: None. IMPRESSION: 1. Large region of  acute/early subacute infarction within the right posterior MCA distribution. No associated hemorrhage identified, however, the susceptibility sequences are motion degraded and petechial hemorrhage may not be visible. Minimal associated local mass effect. 2. Stable background of advanced chronic microvascular ischemic changes, volume loss of the brain, and multiple small chronic infarcts in basal ganglia as well as pons. These results were called by telephone at the time of interpretation on 08/09/2018 at 12:41 am to Dr. Dolores Frame, who verbally acknowledged these results. Electronically Signed   By: Mitzi Hansen M.D.   On: 08/09/2018  00:45   US Carotid Bilateral (at [facility] And [facility] Only)  Result Date: 08/09/2018 CLINICAL DATA:  57 year old male with acute right posterior MCA infarct. EXAM: BILATERAL CAROTID DUPLEX ULTRASOUND TECHNIQUE: Wallace Cullens scale imaging, color Doppler and duplex ultrasound were performed of bilateral carotid and vertebral arteries in the neck. COMPARISON:  Brain MRI 08/08/2018; prior carotid duplex ultrasound 09/02/2017 FINDINGS: Criteria: Quantification of carotid stenosis is based on velocity parameters that correlate the residual internal carotid diameter with NASCET-based stenosis levels, using the diameter of the distal internal carotid lumen as the denominator for stenosis measurement. The following velocity measurements were obtained: RIGHT ICA: 51/18 cm/sec CCA: 51/13 cm/sec SYSTOLIC ICA/CCA RATIO:  1.0 ECA:  74 cm/sec LEFT ICA: 45/18 cm/sec CCA: 61/15 cm/sec SYSTOLIC ICA/CCA RATIO:  0.7 ECA:  73 cm/sec RIGHT CAROTID ARTERY: No significant atherosclerotic plaque or evidence of stenosis. RIGHT VERTEBRAL ARTERY:  Patent with antegrade flow. LEFT CAROTID ARTERY: No significant atherosclerotic plaque or evidence of stenosis. LEFT VERTEBRAL ARTERY:  Patent with antegrade flow. IMPRESSION: Stable duplex carotid ultrasound examination without evidence of significant atherosclerotic plaque, hemodynamically significant stenosis or occlusion of either internal carotid artery. Vertebral arteries remain patent with antegrade flow. Electronically Signed   By: Malachy Moan M.D.   On: 08/09/2018 09:32    Medications:  I have reviewed the patient's current medications. Scheduled: . aspirin  81 mg Oral Daily  . atorvastatin  40 mg Oral QHS  . enoxaparin (LOVENOX) injection  40 mg Subcutaneous Q24H  . losartan  25 mg Oral Daily  . ticagrelor  90 mg Oral BID    Assessment/Plan: Patient noted to have some neurological improvement.  ASA and Brilinta.  Carotid dopplers show no evidence of hemodynamically significant  stenosis.  Echocardiogram shows no cardiac source of emboli with an EF of 50-55%.  A1c 5.3, LDL 112.  Recommendations: 1. Continue telemetry 2. Continue therapy 3. Continue neuro checks 4. Aggressive lipid management with target LDL<70. 5. Continue ASA and Brilinta   LOS: 1 day   Thana Farr, MD Neurology [phone] 08/10/2018  2:13 PM

## 2018-08-10 NOTE — Progress Notes (Signed)
Physical Therapy Treatment Patient Details Name: Cory Harvey MRN: [id number] DOB: [birth year] Today's Date: 08/10/2018    History of Present Illness Pt is a 57 y.o. male with a known history of cocaine use, CVA p/w acute weakness (generalized + L-sided) x1d.  He stated that he was lying on the floor unable to get up. He does not remember how he got there. He denies falling. He states that his friend visited him, and found him down on the ground. His friend called EMS. Pt stated that he felt weak all over, but he was especially weak on his L side. He has no complaints apart from weakness. He had stuttering speech, but stated this is old. He exhibits L-sided weakness, as well as some minimal L-sided neglect on exam. MRI brain performed in ED (+) "Large region of acute/early subacute infarction within the right posterior MCA distribution. No associated hemorrhage identified, however, the susceptibility sequences are motion degraded and petechial hemorrhage may not be visible. Minimal associated local mass effect."  Assessment includes: CVA and hypokalemia.    PT Comments    Pt distraught over inability to locate his glasses. Room, bed/bed clothing checked with no glasses found; unsure whether or not pt did in fact have his glasses here. Pt is a total assist for rolling and repositioning upright in bed. Denies pain initially; however, pain noted in R shoulder with passive movement to support RUE on pillow. Attempted LE exercises with limited repetitions throughout due to pt agitation. Pt continually crosses LEs self limiting participation with increasing irritability. Pt uses LUE to call nurse reporting loss of glasses; nursing unable to understand, so therapist followed up with nursing after session. Pt is able to demonstrate uncoordinated/non functional partial L sh flexion in IR and flaccid wrist/hand. Continue PT attempts to improve participation, range of motion and strength to allow for improved  functional mobility.    Follow Up Recommendations  SNF     Equipment Recommendations       Recommendations for Other Services       Precautions / Restrictions Precautions Precautions: Fall Restrictions Weight Bearing Restrictions: No    Mobility  Bed Mobility Overal bed mobility: Needs Assistance Bed Mobility: Rolling Rolling: Total assist         General bed mobility comments: Total assist to roll and to repositiion upward in bed. Pt occasionally draws legs partially up, but does not assist. Resorts to crossing legs continually. Does not assist with RUE either despite cues  Transfers                    Ambulation/Gait                 Stairs             Wheelchair Mobility    Modified Rankin (Stroke Patients Only)       Balance                                            Cognition Arousal/Alertness: Awake/alert Behavior During Therapy: Agitated Overall Cognitive Status: No family/caregiver present to determine baseline cognitive functioning                                 General Comments: Pt distraught over not being able to find his glasses; reports  unable to see without them. Therapist looked throughout room and in bed rolling pt B directions to attempt to find. Unable to find. Unsure whether or not pt did in fact have his glasses. Nsg notified      Exercises General Exercises - Lower Extremity Ankle Circles/Pumps: AROM;Both;10 reps;Supine Quad Sets: Other (comment)(attempted; pt performs 2 repetitions then disinterested) Heel Slides: AROM;Both;Supine;Other (comment)(several reps each; limited range. Agitated/crosses LEs) Hip ABduction/ADduction: Other (comment);PROM;Left;5 reps;Supine(2 on R becomes increasingly agitated; crosses LEs)    General Comments        Pertinent Vitals/Pain Pain Assessment: Faces Faces Pain Scale: Hurts little more Pain Location: L shoulder with passive movement to  place LUE on pillow    Home Living                      Prior Function            PT Goals (current goals can now be found in the care plan section) Progress towards PT goals: Not progressing toward goals - comment    Frequency    7X/week      PT Plan Current plan remains appropriate    Co-evaluation              AM-PAC PT "6 Clicks" Daily Activity  Outcome Measure  Difficulty turning over in bed (including adjusting bedclothes, sheets and blankets)?: Unable Difficulty moving from lying on back to sitting on the side of the bed? : Unable Difficulty sitting down on and standing up from a chair with arms (e.g., wheelchair, bedside commode, etc,.)?: Unable Help needed moving to and from a bed to chair (including a wheelchair)?: Total Help needed walking in hospital room?: Total Help needed climbing 3-5 steps with a railing? : Total 6 Click Score: 6    End of Session   Activity Tolerance: Treatment limited secondary to agitation Patient left: in bed;with call bell/phone within reach;with bed alarm set Nurse Communication: Other (comment)(pt distress over glasses/unsure whether actually here or not) PT Visit Diagnosis: Unsteadiness on feet (R26.81);Muscle weakness (generalized) (M62.81);Difficulty in walking, not elsewhere classified (R26.2);Hemiplegia and hemiparesis Hemiplegia - Right/Left: Left Hemiplegia - dominant/non-dominant: Dominant Hemiplegia - caused by: Unspecified     Time: 9604-5409 PT Time Calculation (min) (ACUTE ONLY): 14 min  Charges:  $Therapeutic Exercise: 8-22 mins                      Scot Dock, PTA 08/10/2018, 10:42 AM

## 2018-08-10 NOTE — Progress Notes (Addendum)
[facility] [place] at [facility]   PATIENT NAME: Cory Harvey    MR#:  [id number]  DATE OF BIRTH:  June 14, 1961  SUBJECTIVE:  CHIEF COMPLAINT:   Chief Complaint  Patient presents with  . Weakness   -Still with left-sided weakness.  Physical therapy recommending rehab  REVIEW OF SYSTEMS:  Review of Systems  Constitutional: Negative for chills, fever and malaise/fatigue.  HENT: Negative for ear discharge, hearing loss and nosebleeds.   Eyes: Negative for blurred vision and double vision.  Respiratory: Negative for cough, shortness of breath and wheezing.   Cardiovascular: Negative for chest pain and palpitations.  Gastrointestinal: Negative for abdominal pain, constipation, diarrhea, nausea and vomiting.  Genitourinary: Negative for dysuria.  Musculoskeletal: Negative for myalgias.  Neurological: Positive for sensory change, speech change and focal weakness. Negative for dizziness, seizures and headaches.  Psychiatric/Behavioral: Negative for depression.    DRUG ALLERGIES:  No Known Allergies  VITALS:  Blood pressure (!) 122/99, pulse 62, temperature 97.7 F (36.5 C), temperature source Oral, resp. rate 17, height 5\' 8"  (1.727 m), weight 71.3 kg, SpO2 100 %.  PHYSICAL EXAMINATION:  Physical Exam  GENERAL:  56 y.o.-year-old patient lying in the bed with no acute distress.  EYES: Pupils equal, round, reactive to light and accommodation. No scleral icterus. Extraocular muscles intact.  HEENT: Head atraumatic, normocephalic. Oropharynx and nasopharynx clear.  NECK:  Supple, no jugular venous distention. No thyroid enlargement, no tenderness.  LUNGS: Normal breath sounds bilaterally, no wheezing, rales,rhonchi or crepitation. No use of accessory muscles of respiration.  CARDIOVASCULAR: S1, S2 normal. No murmurs, rubs, or gallops.  ABDOMEN: Soft, nontender, nondistended. Bowel sounds present. No organomegaly or mass.  EXTREMITIES: No pedal edema, cyanosis,  or clubbing.  NEUROLOGIC: Cranial nerves II through XII are intact.  Left-sided hemineglect noted.  strength is 3/5-in left upper and lower extremities whereas right side is 5 x 5.. Sensation intact. Gait not checked.  PSYCHIATRIC: The patient is alert and oriented x 3.  SKIN: No obvious rash, lesion, or ulcer.    LABORATORY PANEL:   CBC Recent Labs  Lab 08/08/18 1900  WBC 10.6  HGB 15.2  HCT 45.3  PLT 358   ------------------------------------------------------------------------------------------------------------------  Chemistries  Recent Labs  Lab 08/08/18 1900 08/09/18 0311 08/10/18 0402  NA 142  --  138  K 3.4*  --  3.4*  CL 111  --  108  CO2 23  --  25  GLUCOSE 88  --  102*  BUN 10  --  7  CREATININE 1.13  --  0.88  CALCIUM 9.4  --  8.5*  MG  --  2.1  --   AST 22  --   --   ALT 15  --   --   ALKPHOS 51  --   --   BILITOT 1.1  --   --    ------------------------------------------------------------------------------------------------------------------  Cardiac Enzymes No results for input(s): TROPONINI in the last 168 hours. ------------------------------------------------------------------------------------------------------------------  RADIOLOGY:  Dg Eye Foreign Body  Result Date: 08/08/2018 CLINICAL DATA:  Metal working/exposure; clearance prior to MRI EXAM: ORBITS FOR FOREIGN BODY - 2 VIEW COMPARISON:  Head CT dated 08/08/2018 FINDINGS: High attenuating densities noted over the right orbit corresponding to the right MCA stent seen on the earlier CT. No additional radiopaque foreign object identified. IMPRESSION: Right MCA stent. Electronically Signed   By: Elgie Collard M.D.   On: 08/08/2018 22:04   Dg Chest 1 View  Result Date: 08/08/2018 CLINICAL  DATA:  Initial evaluation for generalized weakness. EXAM: CHEST  1 VIEW COMPARISON:  Prior radiograph from 05/25/2018. FINDINGS: Mild cardiomegaly, stable. Mediastinal silhouette normal. Tortuosity the  intrathoracic aorta noted. Lungs mildly hypoinflated. No focal infiltrates. Mild diffuse interstitial prominence with perihilar vascular congestion without frank alveolar edema. No pleural effusion. No pneumothorax. No acute osseus abnormality. IMPRESSION: Cardiomegaly with mild diffuse pulmonary interstitial congestion without frank alveolar edema. Electronically Signed   By: Rise MuBenjamin  McClintock M.D.   On: 08/08/2018 22:05   Dg Abd 1 View  Result Date: 08/08/2018 CLINICAL DATA:  Initial evaluation for generalized weakness. EXAM: ABDOMEN - 1 VIEW COMPARISON:  None. FINDINGS: Bowel gas pattern within normal limits without obstruction or ileus. No abnormal bowel wall thickening. No appreciable free air on these limited supine views. No soft tissue mass or abnormal calcification. Mild degenerative changes noted within lower lumbar spine. IMPRESSION: Nonobstructive bowel gas pattern with no radiographic evidence for acute intra-abdominal process. Electronically Signed   By: Rise MuBenjamin  McClintock M.D.   On: 08/08/2018 22:07   Ct Head Wo Contrast  Result Date: 08/08/2018 CLINICAL DATA:  Generalized weakness EXAM: CT HEAD WITHOUT CONTRAST TECHNIQUE: Contiguous axial images were obtained from the base of the skull through the vertex without intravenous contrast. COMPARISON:  07/04/2018 MRI FINDINGS: Brain: Chronic small vessel disease throughout the deep white matter. Mild cerebral atrophy. No acute intracranial abnormality. Specifically, no hemorrhage, hydrocephalus, mass lesion, acute infarction, or significant intracranial injury. Vascular: Stents noted within the right middle cerebral artery. Skull: No acute calvarial abnormality. Sinuses/Orbits: Visualized paranasal sinuses and mastoids clear. Orbital soft tissues unremarkable. Other: None IMPRESSION: No acute intracranial abnormality. Atrophy, chronic microvascular disease. Electronically Signed   By: Charlett NoseKevin  Dover M.D.   On: 08/08/2018 19:44   Mr Brain Wo  Contrast  Result Date: 08/09/2018 CLINICAL DATA:  57 y/o M; gaze preference to the right. History of CVA and cocaine use. EXAM: MRI HEAD WITHOUT CONTRAST TECHNIQUE: Multiplanar, multiecho pulse sequences of the brain and surrounding structures were obtained without intravenous contrast. COMPARISON:  07/04/2018 MRI of the head.  08/08/2018 CT head. FINDINGS: Brain: Large region of patchy reduced diffusion throughout the right posterior frontal lobe, posterior temporal lobe, parietal lobe, and the posterior insula compatible with acute/early subacute infarction. The region of infarction is associated with minimal T2 FLAIR hyperintense signal abnormality and local mass effect. No new susceptibility hypointensity to indicate intracranial hemorrhage, however, the susceptibility sequences motion degraded in small hemorrhage may not be visible. Stable hemosiderin deposition from chronic hemorrhage in the right posterior frontal lobe. Stable small chronic infarctions in the right hemi pons and scattered throughout the basal ganglia greatest in the left thalamus and right caudate head. Stable advanced chronic microvascular ischemic changes and volume loss of the brain. No extra-axial collection, hydrocephalus, or herniation. Vascular: Stable susceptibility blooming from right middle cerebral artery stents in the MCA cistern and the sylvian fissure. Skull and upper cervical spine: Normal marrow signal. Sinuses/Orbits: Negative. Other: None. IMPRESSION: 1. Large region of acute/early subacute infarction within the right posterior MCA distribution. No associated hemorrhage identified, however, the susceptibility sequences are motion degraded and petechial hemorrhage may not be visible. Minimal associated local mass effect. 2. Stable background of advanced chronic microvascular ischemic changes, volume loss of the brain, and multiple small chronic infarcts in basal ganglia as well as pons. These results were called by  telephone at the time of interpretation on 08/09/2018 at 12:41 am to Dr. Dolores FrameSung, who verbally acknowledged these results. Electronically Signed  By: Mitzi Hansen M.D.   On: 08/09/2018 00:45   US Carotid Bilateral (at [facility] Only)  Result Date: 08/09/2018 CLINICAL DATA:  57 year old male with acute right posterior MCA infarct. EXAM: BILATERAL CAROTID DUPLEX ULTRASOUND TECHNIQUE: Wallace Cullens scale imaging, color Doppler and duplex ultrasound were performed of bilateral carotid and vertebral arteries in the neck. COMPARISON:  Brain MRI 08/08/2018; prior carotid duplex ultrasound 09/02/2017 FINDINGS: Criteria: Quantification of carotid stenosis is based on velocity parameters that correlate the residual internal carotid diameter with NASCET-based stenosis levels, using the diameter of the distal internal carotid lumen as the denominator for stenosis measurement. The following velocity measurements were obtained: RIGHT ICA: 51/18 cm/sec CCA: 51/13 cm/sec SYSTOLIC ICA/CCA RATIO:  1.0 ECA:  74 cm/sec LEFT ICA: 45/18 cm/sec CCA: 61/15 cm/sec SYSTOLIC ICA/CCA RATIO:  0.7 ECA:  73 cm/sec RIGHT CAROTID ARTERY: No significant atherosclerotic plaque or evidence of stenosis. RIGHT VERTEBRAL ARTERY:  Patent with antegrade flow. LEFT CAROTID ARTERY: No significant atherosclerotic plaque or evidence of stenosis. LEFT VERTEBRAL ARTERY:  Patent with antegrade flow. IMPRESSION: Stable duplex carotid ultrasound examination without evidence of significant atherosclerotic plaque, hemodynamically significant stenosis or occlusion of either internal carotid artery. Vertebral arteries remain patent with antegrade flow. Electronically Signed   By: Malachy Moan M.D.   On: 08/09/2018 09:32    EKG:   Orders placed or performed during the hospital encounter of 08/08/18  . ED EKG  . ED EKG  . EKG 12-Lead  . EKG 12-Lead    ASSESSMENT AND PLAN:   57 year old male with past medical history significant for substance  abuse with cocaine and alcohol, prior history of strokes with no residual neurological deficits presents to hospital secondary to left-sided weakness and left-sided hemineglect  1.  Acute stroke-MRI confirming a large area of acute infarction in posterior MCA distribution..with left sided weakness -Neuro consult, neurochecks -Currently on aspirin and Brilinta-suspect non-compliance with his medications -On statin- dose increased as LDL >100 -PT/OT consults. -Carotid Dopplers with no hemodynamically significant stenosis.  Echocardiogram is done.  TEE was negative back in April 2019 - PT recommended SNF  2.  Hypertension-on losartan  3.  Hypokalemia- replaced  4.  Substance abuse-positive for cocaine in the urine again.  Significantly counseled against substance abuse -Snorts/inhales cocaine.  Does not do IV drugs  5.  DVT prophylaxis-Lovenox  Need rehab at discharge due to significant left-sided weakness.  Awaiting PASSR.  Continue to work with therapy while in the hospital  All the records are reviewed and case discussed with Care Management/Social Workerr. Management plans discussed with the patient, family and they are in agreement.  CODE STATUS: Full code  TOTAL TIME TAKING CARE OF THIS PATIENT: 37 minutes.   POSSIBLE D/C IN 2 DAYS, DEPENDING ON CLINICAL CONDITION.   Enid Baas M.D on 08/10/2018 at 9:33 AM  Between 7am to 6pm - Pager - [phone]  After 6pm go to [email or web address] - Social research officer, government  [facility]  Office  [phone]  CC: Primary care physician; Marisue Ivan, MD

## 2018-08-11 ENCOUNTER — Inpatient Hospital Stay: Payer: Medicare Other

## 2018-08-11 MED ORDER — LORAZEPAM 2 MG/ML IJ SOLN: 1.0000 mg | Freq: Once | INTRAMUSCULAR | Status: DC

## 2018-08-11 NOTE — Progress Notes (Signed)
[facility] - [place] at [facility]   PATIENT NAME: Cory Harvey    MR#:  [id number]  DATE OF BIRTH:  1961/05/10  SUBJECTIVE:  CHIEF COMPLAINT:   Chief Complaint  Patient presents with  . Weakness   - left sided hemi-neglect persist, left sided weakness - also complains of right shoulder pain  REVIEW OF SYSTEMS:  Review of Systems  Constitutional: Negative for chills, fever and malaise/fatigue.  HENT: Negative for ear discharge, hearing loss and nosebleeds.   Eyes: Negative for blurred vision and double vision.  Respiratory: Negative for cough, shortness of breath and wheezing.   Cardiovascular: Negative for chest pain and palpitations.  Gastrointestinal: Negative for abdominal pain, constipation, diarrhea, nausea and vomiting.  Genitourinary: Negative for dysuria.  Musculoskeletal: Negative for myalgias.  Neurological: Positive for sensory change, speech change and focal weakness. Negative for dizziness, seizures and headaches.  Psychiatric/Behavioral: Negative for depression.    DRUG ALLERGIES:  No Known Allergies  VITALS:  Blood pressure 127/82, pulse 70, temperature 98.2 F (36.8 C), temperature source Oral, resp. rate 18, height 5\' 8"  (1.727 m), weight 71.3 kg, SpO2 100 %.  PHYSICAL EXAMINATION:  Physical Exam  GENERAL:  57 y.o.-year-old patient lying in the bed with no acute distress.  EYES: Pupils equal, round, reactive to light and accommodation. No scleral icterus. Extraocular muscles intact.  HEENT: Head atraumatic, normocephalic. Oropharynx and nasopharynx clear.  NECK:  Supple, no jugular venous distention. No thyroid enlargement, no tenderness.  LUNGS: Normal breath sounds bilaterally, no wheezing, rales,rhonchi or crepitation. No use of accessory muscles of respiration.  CARDIOVASCULAR: S1, S2 normal. No murmurs, rubs, or gallops.  ABDOMEN: Soft, nontender, nondistended. Bowel sounds present. No organomegaly or mass.  EXTREMITIES: No  pedal edema, cyanosis, or clubbing.  NEUROLOGIC: Cranial nerves II through XII are intact.  Left-sided hemineglect noted.  strength is 3/5-in left upper and lower extremities whereas right side is 5 x 5..limited right shoulder movement due to pain. Sensation intact. Gait not checked.  PSYCHIATRIC: The patient is alert and oriented x 3.  SKIN: No obvious rash, lesion, or ulcer.    LABORATORY PANEL:   CBC Recent Labs  Lab 08/08/18 1900  WBC 10.6  HGB 15.2  HCT 45.3  PLT 358   ------------------------------------------------------------------------------------------------------------------  Chemistries  Recent Labs  Lab 08/08/18 1900 08/09/18 0311 08/10/18 0402  NA 142  --  138  K 3.4*  --  3.4*  CL 111  --  108  CO2 23  --  25  GLUCOSE 88  --  102*  BUN 10  --  7  CREATININE 1.13  --  0.88  CALCIUM 9.4  --  8.5*  MG  --  2.1  --   AST 22  --   --   ALT 15  --   --   ALKPHOS 51  --   --   BILITOT 1.1  --   --    ------------------------------------------------------------------------------------------------------------------  Cardiac Enzymes No results for input(s): TROPONINI in the last 168 hours. ------------------------------------------------------------------------------------------------------------------  RADIOLOGY:  No results found.  EKG:   Orders placed or performed during the hospital encounter of 08/08/18  . ED EKG  . ED EKG  . EKG 12-Lead  . EKG 12-Lead    ASSESSMENT AND PLAN:   57 year old male with past medical history significant for substance abuse with cocaine and alcohol, prior history of strokes with no residual neurological deficits presents to hospital secondary to left-sided weakness and left-sided hemineglect  1.  Acute stroke-MRI confirming a large area of acute infarction in posterior MCA distribution..with left sided weakness -Neuro consult, neurochecks -Currently on aspirin and Brilinta-suspect non-compliance with his  medications -On statin- dose increased as LDL >100 -PT/OT consults. -Carotid Dopplers with no hemodynamically significant stenosis.  Echocardiogram is done.  TEE was negative back in April 2019 - PT recommended SNF - right shoulder pain- likely rotator cuff injury- denies any fall- get X ray  2.  Hypertension-on losartan  3.  Hypokalemia- replaced  4.  Substance abuse-positive for cocaine in the urine again.  Significantly counseled against substance abuse -Snorts/inhales cocaine.  Does not do IV drugs  5.  DVT prophylaxis-Lovenox  Need rehab at discharge due to significant left-sided weakness.  Awaiting PASSR.  Continue to work with therapy while in the hospital    All the records are reviewed and case discussed with Care Management/Social Workerr. Management plans discussed with the patient, family and they are in agreement.  CODE STATUS: Full code  TOTAL TIME TAKING CARE OF THIS PATIENT: 34 minutes.   POSSIBLE D/C IN 2 DAYS, DEPENDING ON CLINICAL CONDITION.   Zurri Rudden M.D on 08/11/2018 at 12:00 PM  Between 7am to 6pm - Pager - [phone]  After 6pm go to [email or web address] - Social research officer, government  [facility]  Office  [phone]  CC: Primary care physician; Marisue Ivan, MD

## 2018-08-11 NOTE — Clinical Social Work Note (Addendum)
CSW spoke with patient and provided SNF bed offers, and patient chose [facility] [place].  CSW contacted [facility] and informed them that patient would like to go to SNF they will begin insurance authorization.  CSW informed [facility], that [facility] number is still pending, CSW to continue to follow patient's progress throughout discharge planning.  Ervin Knack. Silvester Reierson, MSW, [facility] Majors [phone]  08/11/2018 4:14 PM

## 2018-08-11 NOTE — Progress Notes (Signed)
Physical Therapy Treatment Patient Details Name: Cory Harvey MRN: [id number] DOB: 1961-06-25 Today's Date: 08/11/2018    History of Present Illness Pt is a 57 y.o. male with a known history of cocaine use, CVA p/w acute weakness (generalized + L-sided) x1d.  He stated that he was lying on the floor unable to get up. He does not remember how he got there. He denies falling. He states that his friend visited him, and found him down on the ground. His friend called EMS. Pt stated that he felt weak all over, but he was especially weak on his L side. He has no complaints apart from weakness. He had stuttering speech, but stated this is old. He exhibits L-sided weakness, as well as some minimal L-sided neglect on exam. MRI brain performed in ED (+) "Large region of acute/early subacute infarction within the right posterior MCA distribution. No associated hemorrhage identified, however, the susceptibility sequences are motion degraded and petechial hemorrhage may not be visible. Minimal associated local mass effect."  Assessment includes: CVA and hypokalemia.    PT Comments    Pt presents with deficits in strength, transfers, mobility, gait, balance, and activity tolerance but is slowly progressing towards goals.  Pt continues to require extensive assist with bed mobility tasks but did participate more this session.  Pt presented with improved static sitting balance at the EOB with decreased left lateral lean.  Pt was able to stand with +2 mod A but once in standing presented with improved static standing balance and only required occasional assistance to prevent left lateral/posterior LOB.  Pt was able to take several steps with +2 HHA and constant mod A for stability.  Unlike previous session pt was able to clear his L foot from the floor but with exaggerated hip flex and ataxic movements.  Pt will benefit from PT services in a SNF setting upon discharge to safely address above deficits for decreased  caregiver assistance and eventual return to PLOF.     Follow Up Recommendations  SNF     Equipment Recommendations  Other (comment)(TBD at next venue of care)    Recommendations for Other Services       Precautions / Restrictions Precautions Precautions: Fall Restrictions Weight Bearing Restrictions: No    Mobility  Bed Mobility Overal bed mobility: Needs Assistance       Supine to sit: +2 for physical assistance;Mod assist Sit to supine: +2 for physical assistance;Max assist   General bed mobility comments: Extensive verbal and tactile cues for sequencing with pt demonstrating improved participation this session but continues to require extensive assistance.   Transfers Overall transfer level: Needs assistance Equipment used: 2 person hand held assist Transfers: Sit to/from Stand Sit to Stand: +2 physical assistance;+2 safety/equipment;Mod assist         General transfer comment: Max verbal cues for sequencing with pt leaning to the Left upon standing but improved stability grossly with only occasional assist to prevent LOB.   Ambulation/Gait Ambulation/Gait assistance: +2 physical assistance;Mod assist Gait Distance (Feet): 2 Feet Assistive device: 2 person hand held assist Gait Pattern/deviations: Step-to pattern;Shuffle;Ataxic     General Gait Details: Pt able to advance LLE this session but with exaggerated L hip flex and ataxic steps; constant +2 Mod A to prevent LOB while ambulating    Stairs             Wheelchair Mobility    Modified Rankin (Stroke Patients Only)       Balance Overall balance assessment:  Needs assistance Sitting-balance support: Single extremity supported;Feet supported Sitting balance-Leahy Scale: Fair Sitting balance - Comments: Improved static sitting balance this session with only close SBA required while pt seated at the EOB   Standing balance support: Bilateral upper extremity supported;During functional  activity Standing balance-Leahy Scale: Poor Standing balance comment: Left lateral lean with pt requiring constant physical assistance to prevent LOB while attempting to ambulate but only occasional assist during static standing                            Cognition Arousal/Alertness: Awake/alert Behavior During Therapy: WFL for tasks assessed/performed Overall Cognitive Status: Difficult to assess                                        Exercises Total Joint Exercises Ankle Circles/Pumps: AROM;Both;10 reps;15 reps Short Arc Quad: AROM;AAROM;Left;10 reps;15 reps Heel Slides: AROM;Both;10 reps Hip ABduction/ADduction: AROM;AAROM;Both;10 reps;15 reps Straight Leg Raises: AROM;AAROM;Both;10 reps;15 reps Other Exercises Other Exercises: Static sitting balance and core training with unsupported sitting at the EOB Other Exercises: Positioning education for LUE shoulder and hand    General Comments        Pertinent Vitals/Pain Pain Assessment: 0-10    Home Living                      Prior Function            PT Goals (current goals can now be found in the care plan section) Progress towards PT goals: Progressing toward goals    Frequency    7X/week      PT Plan Current plan remains appropriate    Co-evaluation              AM-PAC PT "6 Clicks" Daily Activity  Outcome Measure                   End of Session Equipment Utilized During Treatment: Gait belt Activity Tolerance: Patient tolerated treatment well Patient left: in bed;with call bell/phone within reach;with bed alarm set Nurse Communication: Mobility status PT Visit Diagnosis: Unsteadiness on feet (R26.81);Muscle weakness (generalized) (M62.81);Difficulty in walking, not elsewhere classified (R26.2);Hemiplegia and hemiparesis Hemiplegia - Right/Left: Left Hemiplegia - dominant/non-dominant: Dominant Hemiplegia - caused by: Unspecified     Time:  1610-9604 PT Time Calculation (min) (ACUTE ONLY): 28 min  Charges:  $Therapeutic Exercise: 8-22 mins $Therapeutic Activity: 8-22 mins                     D. Scott Jmya Uliano PT, DPT 08/11/18, 5:15 PM

## 2018-08-12 NOTE — Progress Notes (Signed)
[facility] - [place] at [facility]   PATIENT NAME: Cory Harvey    MR#:  [id number]  DATE OF BIRTH:  [birth year]  SUBJECTIVE:  CHIEF COMPLAINT:   Chief Complaint  Patient presents with  . Weakness   - left sided hemi-neglect persist, left sided weakness, also blurred vision - awaiting rehab placement  REVIEW OF SYSTEMS:  Review of Systems  Constitutional: Negative for chills, fever and malaise/fatigue.  HENT: Negative for ear discharge, hearing loss and nosebleeds.   Eyes: Negative for blurred vision and double vision.  Respiratory: Negative for cough, shortness of breath and wheezing.   Cardiovascular: Negative for chest pain and palpitations.  Gastrointestinal: Negative for abdominal pain, constipation, diarrhea, nausea and vomiting.  Genitourinary: Negative for dysuria.  Musculoskeletal: Negative for myalgias.  Neurological: Positive for sensory change, speech change and focal weakness. Negative for dizziness, seizures and headaches.  Psychiatric/Behavioral: Negative for depression.    DRUG ALLERGIES:  No Known Allergies  VITALS:  Blood pressure (!) 129/92, pulse 63, temperature 98.4 F (36.9 C), temperature source Oral, resp. rate 18, height 5\' 8"  (1.727 m), weight 71.3 kg, SpO2 99 %.  PHYSICAL EXAMINATION:  Physical Exam  GENERAL:  57 y.o.-year-old patient lying in the bed with no acute distress.  EYES: Pupils equal, round, reactive to light and accommodation. No scleral icterus. Extraocular muscles intact.  HEENT: Head atraumatic, normocephalic. Oropharynx and nasopharynx clear.  NECK:  Supple, no jugular venous distention. No thyroid enlargement, no tenderness.  LUNGS: Normal breath sounds bilaterally, no wheezing, rales,rhonchi or crepitation. No use of accessory muscles of respiration.  CARDIOVASCULAR: S1, S2 normal. No murmurs, rubs, or gallops.  ABDOMEN: Soft, nontender, nondistended. Bowel sounds present. No organomegaly or mass.    EXTREMITIES: No pedal edema, cyanosis, or clubbing.  NEUROLOGIC: Cranial nerves II through XII are intact. Decreased visual acuity.  Left-sided hemineglect noted.  strength is 3/5-in left upper and lower extremities whereas right side is 5 x 5..limited right shoulder movement due to pain. Sensation intact. Gait not checked.  PSYCHIATRIC: The patient is alert and oriented x 3.  SKIN: No obvious rash, lesion, or ulcer.    LABORATORY PANEL:   CBC Recent Labs  Lab 08/08/18 1900  WBC 10.6  HGB 15.2  HCT 45.3  PLT 358   ------------------------------------------------------------------------------------------------------------------  Chemistries  Recent Labs  Lab 08/08/18 1900 08/09/18 0311 08/10/18 0402  NA 142  --  138  K 3.4*  --  3.4*  CL 111  --  108  CO2 23  --  25  GLUCOSE 88  --  102*  BUN 10  --  7  CREATININE 1.13  --  0.88  CALCIUM 9.4  --  8.5*  MG  --  2.1  --   AST 22  --   --   ALT 15  --   --   ALKPHOS 51  --   --   BILITOT 1.1  --   --    ------------------------------------------------------------------------------------------------------------------  Cardiac Enzymes No results for input(s): TROPONINI in the last 168 hours. ------------------------------------------------------------------------------------------------------------------  RADIOLOGY:  Dg Shoulder Right  Result Date: 08/11/2018 CLINICAL DATA:  Right shoulder pain EXAM: RIGHT SHOULDER - 2+ VIEW COMPARISON:  None. FINDINGS: Limited exam because of positioning. No malalignment or acute fracture. Preserved joint spaces. AC joint aligned. Visualized right chest unremarkable. IMPRESSION: No acute finding by plain radiography Electronically Signed   By: Judie Petit.  Shick M.D.   On: 08/11/2018 14:41    EKG:  Orders placed or performed during the hospital encounter of 08/08/18  . ED EKG  . ED EKG  . EKG 12-Lead  . EKG 12-Lead    ASSESSMENT AND PLAN:   57 year old male with past medical history  significant for substance abuse with cocaine and alcohol, prior history of strokes with no residual neurological deficits presents to hospital secondary to left-sided weakness and left-sided hemineglect  1.  Acute stroke-MRI confirming a large area of acute infarction in posterior MCA distribution..with left sided weakness -Neuro consult, neurochecks -on aspirin and Brilinta- -On statin- dose increased as LDL >100 -PT/OT consults. -Carotid Dopplers with no hemodynamically significant stenosis.  Echocardiogram is done.  TEE was negative back in April 2019 - PT recommended SNF - right shoulder pain- likely rotator cuff injury- X ray negative  2.  Hypertension-on losartan  3.  Hypokalemia- replaced  4.  Substance abuse-positive for cocaine in the urine this admission.  Significantly counseled against substance abuse -Snorts/inhales cocaine.  Does not do IV drugs  5.  DVT prophylaxis-Lovenox  Need rehab at discharge due to significant left-sided weakness.  Awaiting PASSR.   Has a bed at [facility]- likely discharge tomorrow    All the records are reviewed and case discussed with Care Management/Social Workerr. Management plans discussed with the patient, family and they are in agreement.  CODE STATUS: Full code  TOTAL TIME TAKING CARE OF THIS PATIENT: 34 minutes.   POSSIBLE D/C IN 2 DAYS, DEPENDING ON CLINICAL CONDITION.   Enid Baas M.D on 08/12/2018 at 12:03 PM  Between 7am to 6pm - Pager - [phone]  After 6pm go to [email or web address] - Social research officer, government  [facility]  Office  [phone]  CC: Primary care physician; Marisue Ivan, MD

## 2018-08-12 NOTE — Care Management Important Message (Signed)
Important Message  Patient Details  Name: Cory Harvey MRN: [id number] Date of Birth: 04/22/1961   Medicare Important Message Given:  Other (see comment) Pt. Unable to sign but but was able to put a  mark on the form.  Verbalized he understood the form and a copy left with the pt.   [name] 08/12/2018, 12:00 PM

## 2018-08-12 NOTE — Progress Notes (Signed)
Occupational Therapy Treatment Patient Details Name: Cory Harvey MRN: [id number] DOB: 1961/05/26 Today's Date: 08/12/2018    History of present illness Pt is a 57 y.o. male with a known history of cocaine use, CVA p/w acute weakness (generalized + L-sided) x1d.  He stated that he was lying on the floor unable to get up. He does not remember how he got there. He denies falling. He states that his friend visited him, and found him down on the ground. His friend called EMS. Pt stated that he felt weak all over, but he was especially weak on his L side. He has no complaints apart from weakness. He had stuttering speech, but stated this is old. He exhibits L-sided weakness, as well as some minimal L-sided neglect on exam. MRI brain performed in ED (+) "Large region of acute/early subacute infarction within the right posterior MCA distribution. No associated hemorrhage identified, however, the susceptibility sequences are motion degraded and petechial hemorrhage may not be visible. Minimal associated local mass effect."  Assessment includes: CVA and hypokalemia.   OT comments  Pt seen for OT tx this date. Pt continues to demo improvements in AROM of L side. Pt combed hair with mod assist for L side (vs mod-max previous session). Pt instructed in neuro re-ed PNF patterns with pt performing self ROM with initial verbal cues and tactile cues for technique and pt able to complete with cues to continue. Pt continues to benefit from skilled OT services to address noted impairments and functional deficits. STR remains most appropriate.    Follow Up Recommendations  SNF    Equipment Recommendations  Other (comment)(TBD)    Recommendations for Other Services      Precautions / Restrictions Precautions Precautions: Fall Restrictions Weight Bearing Restrictions: No       Mobility Bed Mobility                  Transfers                      Balance                                           ADL either performed or assessed with clinical judgement   ADL Overall ADL's : Needs assistance/impaired     Grooming: Bed level;Moderate assistance Grooming Details (indicate cue type and reason): pt able to comb his R side of his head with set up of comb using R non-dom hand, required tactile and verbal cues to attempt to comb L side of hair with pt attempting and able to comb a small portion of L side, requiring assist from OT for the remainder of the L side                                     Vision Baseline Vision/History: No visual deficits Vision Assessment?: Yes   Perception     Praxis      Cognition Arousal/Alertness: Awake/alert Behavior During Therapy: WFL for tasks assessed/performed Overall Cognitive Status: No family/caregiver present to determine baseline cognitive functioning                                 General Comments: Pt able to follow commands with  cues to attend to L side        Exercises Other Exercises Other Exercises: Pt instructed in BUE AAROM/self ROM ex crossing midline requiring initial hand over hand and pt able to return demo with supervision to CGA for LUE and verbal/visual cues to continue. Pt continues to demo improved AROM with LUE and LLE this date   Shoulder Instructions       General Comments      Pertinent Vitals/ Pain       Pain Assessment: Faces Faces Pain Scale: Hurts a little bit Pain Location: R shoulder with AROM (chronic) Pain Descriptors / Indicators: Grimacing;Aching Pain Intervention(s): Limited activity within patient's tolerance;Monitored during session;Repositioned  Home Living                                          Prior Functioning/Environment              Frequency  Min 3X/week        Progress Toward Goals  OT Goals(current goals can now be found in the care plan section)  Progress towards OT goals: Progressing toward  goals  Acute Rehab OT Goals Patient Stated Goal: To get stronger OT Goal Formulation: With patient Time For Goal Achievement: 08/23/18 Potential to Achieve Goals: Good  Plan Discharge plan remains appropriate;Frequency remains appropriate    Co-evaluation                 AM-PAC PT "6 Clicks" Daily Activity     Outcome Measure   Help from another person eating meals?: A Lot Help from another person taking care of personal grooming?: A Lot Help from another person toileting, which includes using toliet, bedpan, or urinal?: Total Help from another person bathing (including washing, rinsing, drying)?: A Lot Help from another person to put on and taking off regular upper body clothing?: A Lot Help from another person to put on and taking off regular lower body clothing?: A Lot 6 Click Score: 11    End of Session    OT Visit Diagnosis: Other abnormalities of gait and mobility (R26.89);Hemiplegia and hemiparesis;Low vision, both eyes (H54.2) Hemiplegia - Right/Left: Left Hemiplegia - dominant/non-dominant: Dominant Hemiplegia - caused by: Cerebral infarction   Activity Tolerance Patient tolerated treatment well   Patient Left in bed;with call bell/phone within reach;with bed alarm set   Nurse Communication          Time: 8309-4076 OT Time Calculation (min): 18 min  Charges: OT General Charges $OT Visit: 1 Visit OT Treatments $Neuromuscular Re-education: 8-22 mins  Richrd Prime, MPH, MS, OTR/L ascom [phone] 08/12/18, 2:35 PM

## 2018-08-12 NOTE — Progress Notes (Signed)
Physical Therapy Treatment Patient Details Name: Cory Harvey MRN: [id number] DOB: 12/27/1960 Today's Date: 08/12/2018    History of Present Illness Pt is a 57 y.o. male with a known history of cocaine use, CVA p/w acute weakness (generalized + L-sided) x1d.  He stated that he was lying on the floor unable to get up. He does not remember how he got there. He denies falling. He states that his friend visited him, and found him down on the ground. His friend called EMS. Pt stated that he felt weak all over, but he was especially weak on his L side. He has no complaints apart from weakness. He had stuttering speech, but stated this is old. He exhibits L-sided weakness, as well as some minimal L-sided neglect on exam. MRI brain performed in ED (+) "Large region of acute/early subacute infarction within the right posterior MCA distribution. No associated hemorrhage identified, however, the susceptibility sequences are motion degraded and petechial hemorrhage may not be visible. Minimal associated local mass effect."  Assessment includes: CVA and hypokalemia.    PT Comments    Pt is able to perform bed exercises with therapist. He requires max cues with hand over hand assist to complete correctly. He requires extensive verbal and tactile cues for sequencing with bed mobility as well. He is stable in sitting once upright at EOB. Pt is able to ambulate a very short distance from bed to recliner. Cues for proper sequencing with rolling walker and heavy assist to prevent him from falling. Gait speed is slow. Pt will benefit from PT services to address deficits in strength, balance, and mobility in order to return to full function at home.    Follow Up Recommendations  SNF     Equipment Recommendations  Other (comment)(TBD at next venue of care)    Recommendations for Other Services       Precautions / Restrictions Precautions Precautions: Fall Restrictions Weight Bearing Restrictions: No     Mobility  Bed Mobility Overal bed mobility: Needs Assistance Bed Mobility: Rolling Rolling: Mod assist   Supine to sit: +2 for physical assistance;Mod assist Sit to supine: +2 for physical assistance;Mod assist   General bed mobility comments: Extensive verbal and tactile cues for sequencing with pt demonstrating need for hand over hand demonstration. Stable in sitting once upright at EOB.  Transfers Overall transfer level: Needs assistance Equipment used: Rolling walker (2 wheeled) Transfers: Sit to/from Stand Sit to Stand: +2 physical assistance;Mod assist         General transfer comment: Pt requires max verbal cues for sequencing. Pt unsteady in standing and requires heavy cues for proper posture with walker  Ambulation/Gait Ambulation/Gait assistance: +2 physical assistance;Mod assist Gait Distance (Feet): 3 Feet Assistive device: Rolling walker (2 wheeled) Gait Pattern/deviations: Step-to pattern;Shuffle;Ataxic     General Gait Details: Pt is able to ambulate a very short distance from bed to recliner. Cues for proper sequencing with rolling walker and heavy assist to prevent him from falling. Gait speed is slow   Social research officer, government Rankin (Stroke Patients Only)       Balance Overall balance assessment: Needs assistance Sitting-balance support: Single extremity supported;Feet supported Sitting balance-Leahy Scale: Fair   Postural control: Left lateral lean Standing balance support: Bilateral upper extremity supported;During functional activity Standing balance-Leahy Scale: Poor Standing balance comment: Left lateral lean with pt requiring constant physical assistance to prevent LOB while attempting  to ambulate but only occasional assist during static standing                            Cognition Arousal/Alertness: Awake/alert Behavior During Therapy: Flat affect Overall Cognitive Status: No  family/caregiver present to determine baseline cognitive functioning                                 General Comments: Pt able to follow commands with cues to attend to L side. Hand over hand assistance      Exercises Total Joint Exercises Ankle Circles/Pumps: AROM;Both;10 reps Heel Slides: AROM;Both;10 reps Hip ABduction/ADduction: AROM;Both;10 reps Straight Leg Raises: AROM;Both;10 reps Other Exercises Other Exercises: Pt instructed in BUE AAROM/self ROM ex crossing midline requiring initial hand over hand and pt able to return demo with supervision to CGA for LUE and verbal/visual cues to continue. Pt continues to demo improved AROM with LUE and LLE this date    General Comments        Pertinent Vitals/Pain Pain Assessment: Faces Faces Pain Scale: No hurt Pain Location: R shoulder with AROM (chronic) Pain Descriptors / Indicators: Grimacing;Aching Pain Intervention(s): Limited activity within patient's tolerance;Monitored during session;Repositioned    Home Living                      Prior Function            PT Goals (current goals can now be found in the care plan section) Acute Rehab PT Goals Patient Stated Goal: To get stronger PT Goal Formulation: With patient Time For Goal Achievement: 08/22/18 Potential to Achieve Goals: Fair Progress towards PT goals: Progressing toward goals    Frequency    7X/week      PT Plan Current plan remains appropriate    Co-evaluation              AM-PAC PT "6 Clicks" Daily Activity  Outcome Measure  Difficulty turning over in bed (including adjusting bedclothes, sheets and blankets)?: Unable Difficulty moving from lying on back to sitting on the side of the bed? : Unable Difficulty sitting down on and standing up from a chair with arms (e.g., wheelchair, bedside commode, etc,.)?: Unable Help needed moving to and from a bed to chair (including a wheelchair)?: Total Help needed walking in  hospital room?: Total Help needed climbing 3-5 steps with a railing? : Total 6 Click Score: 6    End of Session Equipment Utilized During Treatment: Gait belt Activity Tolerance: Patient tolerated treatment well Patient left: in chair;with call bell/phone within reach;with chair alarm set Nurse Communication: Mobility status;Other (comment)(RN assists with transfers) PT Visit Diagnosis: Unsteadiness on feet (R26.81);Muscle weakness (generalized) (M62.81);Difficulty in walking, not elsewhere classified (R26.2);Hemiplegia and hemiparesis Hemiplegia - Right/Left: Left Hemiplegia - dominant/non-dominant: Dominant Hemiplegia - caused by: Unspecified     Time: 1610-9604 PT Time Calculation (min) (ACUTE ONLY): 14 min  Charges:  $Therapeutic Exercise: 8-22 mins                     Cory Harvey PT, DPT, GCS    Cory Harvey 08/12/2018, 4:22 PM

## 2018-08-13 LAB — BASIC METABOLIC PANEL
ANION GAP: 6 (ref 5–15)
BUN: 11 mg/dL (ref 6–20)
CALCIUM: 9 mg/dL (ref 8.9–10.3)
CO2: 26 mmol/L (ref 22–32)
CREATININE: 0.86 mg/dL (ref 0.61–1.24)
Chloride: 106 mmol/L (ref 98–111)
GFR calc Af Amer: >60 mL/min (ref >60)
Glucose, Bld: 113 mg/dL — ABNORMAL HIGH (ref 70–99)
POTASSIUM: 3.7 mmol/L (ref 3.5–5.1)
Sodium: 138 mmol/L (ref 135–145)

## 2018-08-13 NOTE — Plan of Care (Signed)
  Problem: Education: Goal: Knowledge of General Education information will improve Description Including pain rating scale, medication(s)/side effects and non-pharmacologic comfort measures Outcome: Progressing   Problem: Health Behavior/Discharge Planning: Goal: Ability to manage health-related needs will improve Outcome: Progressing   Problem: Education: Goal: Knowledge of disease or condition will improve Outcome: Progressing Goal: Knowledge of secondary prevention will improve Outcome: Progressing Goal: Knowledge of patient specific risk factors addressed and post discharge goals established will improve Outcome: Progressing   Problem: Health Behavior/Discharge Planning: Goal: Ability to manage health-related needs will improve Outcome: Progressing   Problem: Self-Care: Goal: Ability to participate in self-care as condition permits will improve Outcome: Progressing Goal: Ability to communicate needs accurately will improve Outcome: Progressing   Problem: Ischemic Stroke/TIA Tissue Perfusion: Goal: Complications of ischemic stroke/TIA will be minimized Outcome: Progressing

## 2018-08-13 NOTE — Progress Notes (Signed)
Physical Therapy Treatment Patient Details Name: Cory Harvey MRN: [id number] DOB: Oct 02, 1961 Today's Date: 08/13/2018    History of Present Illness Pt is a 57 y.o. male with a known history of cocaine use, CVA p/w acute weakness (generalized + L-sided) x1d.  He stated that he was lying on the floor unable to get up. He does not remember how he got there. He denies falling. He states that his friend visited him, and found him down on the ground. His friend called EMS. Pt stated that he felt weak all over, but he was especially weak on his L side. He has no complaints apart from weakness. He had stuttering speech, but stated this is old. He exhibits L-sided weakness, as well as some minimal L-sided neglect on exam. MRI brain performed in ED (+) "Large region of acute/early subacute infarction within the right posterior MCA distribution. No associated hemorrhage identified, however, the susceptibility sequences are motion degraded and petechial hemorrhage may not be visible. Minimal associated local mass effect."  Assessment includes: CVA and hypokalemia.    PT Comments    Pt in bed resting and agree to participate with gentle encouragement. Participated in exercises as described below.  To edge of bed with mod a x 2.  Verbal and tactile cues for hand placements and sequencing.  Once sitting on edge of bed, he was able to maintain his sitting balance but required close min guard for safety.  Stood with mod a x 2.  HHA was tried and while he was able to lift RLE to step in place with no buckling LLE (pt may may have hyperextended at LE knee) he was unable to lift LLE.  Pt was able to slide feet sideways to transfer to chair but he was unable to take any steps backwards without needing max a x 2.  Recliner was brought up to him and he required tactile and verbal cues to be able to sit in recliner.  He was unable to take any real effective forward steps during gait attempts.  He remained in recliner with  alarm on and call bell in reach.   Follow Up Recommendations  SNF     Equipment Recommendations       Recommendations for Other Services       Precautions / Restrictions Precautions Precautions: Fall Restrictions Weight Bearing Restrictions: No    Mobility  Bed Mobility Overal bed mobility: Needs Assistance Bed Mobility: Supine to Sit     Supine to sit: Mod assist;+2 for physical assistance        Transfers Overall transfer level: Needs assistance Equipment used: 2 person hand held assist Transfers: Sit to/from Stand Sit to Stand: +2 physical assistance;Mod assist            Ambulation/Gait Ambulation/Gait assistance: +2 physical assistance;Mod assist Gait Distance (Feet): 3 Feet Assistive device: 2 person hand held assist Gait Pattern/deviations: Step-to pattern;Shuffle;Ataxic     General Gait Details: slid feet sideways, unable to take a proper step, significant post lean when attempting to back up to chair.  Requires +2 assist for all out of bed mobility.   Stairs             Wheelchair Mobility    Modified Rankin (Stroke Patients Only)       Balance Overall balance assessment: Needs assistance Sitting-balance support: Single extremity supported;Feet supported Sitting balance-Leahy Scale: Fair     Standing balance support: Bilateral upper extremity supported;During functional activity Standing balance-Leahy Scale: Poor  Cognition Arousal/Alertness: Awake/alert Behavior During Therapy: WFL for tasks assessed/performed Overall Cognitive Status: No family/caregiver present to determine baseline cognitive functioning                                 General Comments: Pt able to follow commands with cues to attend to L side. Hand over hand assistance      Exercises Other Exercises Other Exercises: Focus on LLE supine and seated exercises for ankle pumps, heel slides, SLR, LAQ and  marching.    General Comments        Pertinent Vitals/Pain Pain Assessment: No/denies pain    Home Living                      Prior Function            PT Goals (current goals can now be found in the care plan section) Progress towards PT goals: Progressing toward goals    Frequency    7X/week      PT Plan Current plan remains appropriate    Co-evaluation              AM-PAC PT "6 Clicks" Daily Activity  Outcome Measure  Difficulty turning over in bed (including adjusting bedclothes, sheets and blankets)?: Unable Difficulty moving from lying on back to sitting on the side of the bed? : Unable Difficulty sitting down on and standing up from a chair with arms (e.g., wheelchair, bedside commode, etc,.)?: Unable Help needed moving to and from a bed to chair (including a wheelchair)?: Total Help needed walking in hospital room?: Total Help needed climbing 3-5 steps with a railing? : Total 6 Click Score: 6    End of Session Equipment Utilized During Treatment: Gait belt Activity Tolerance: Patient tolerated treatment well Patient left: in chair;with chair alarm set;with call bell/phone within reach   Hemiplegia - Right/Left: Left Hemiplegia - dominant/non-dominant: Dominant Hemiplegia - caused by: Unspecified     Time: 1610-9604 PT Time Calculation (min) (ACUTE ONLY): 27 min  Charges:  $Therapeutic Exercise: 8-22 mins $Therapeutic Activity: 8-22 mins                     Danielle Dess, PTA 08/13/18, 9:19 AM

## 2018-08-13 NOTE — Progress Notes (Signed)
[facility] -  at [facility]   PATIENT NAME: Cory Harvey    MR#:  [id number]  DATE OF BIRTH:  30-Jul-1961  SUBJECTIVE:  CHIEF COMPLAINT:   Chief Complaint  Patient presents with  . Weakness   - left sided hemi-neglect persist, left sided weakness, also blurred vision - awaiting rehab placement  REVIEW OF SYSTEMS:  Review of Systems  Constitutional: Negative for chills, fever and malaise/fatigue.  HENT: Negative for ear discharge, hearing loss and nosebleeds.   Eyes: Negative for blurred vision and double vision.  Respiratory: Negative for cough, shortness of breath and wheezing.   Cardiovascular: Negative for chest pain and palpitations.  Gastrointestinal: Negative for abdominal pain, constipation, diarrhea, nausea and vomiting.  Genitourinary: Negative for dysuria.  Musculoskeletal: Negative for myalgias.  Neurological: Positive for sensory change, speech change and focal weakness. Negative for dizziness, seizures and headaches.  Psychiatric/Behavioral: Negative for depression.    DRUG ALLERGIES:  No Known Allergies  VITALS:  Blood pressure (!) 130/92, pulse 79, temperature 98.9 F (37.2 C), temperature source Oral, resp. rate 20, height 5\' 8"  (1.727 m), weight 71.3 kg, SpO2 99 %.  PHYSICAL EXAMINATION:  Physical Exam  GENERAL:  57 y.o.-year-old patient lying in the bed with no acute distress.  EYES: Pupils equal, round, reactive to light and accommodation. No scleral icterus. Extraocular muscles intact.  HEENT: Head atraumatic, normocephalic. Oropharynx and nasopharynx clear.  NECK:  Supple, no jugular venous distention. No thyroid enlargement, no tenderness.  LUNGS: Normal breath sounds bilaterally, no wheezing, rales,rhonchi or crepitation. No use of accessory muscles of respiration.  CARDIOVASCULAR: S1, S2 normal. No murmurs, rubs, or gallops.  ABDOMEN: Soft, nontender, nondistended. Bowel sounds present. No organomegaly or mass.    EXTREMITIES: No pedal edema, cyanosis, or clubbing.  NEUROLOGIC: Cranial nerves II through XII are intact. Decreased visual acuity.  Left-sided hemineglect noted.  strength is 3/5-in left upper and lower extremities whereas right side is 5 x 5..limited right shoulder movement due to pain. Sensation intact. Gait not checked.  PSYCHIATRIC: The patient is alert and oriented x 3.  SKIN: No obvious rash, lesion, or ulcer.    LABORATORY PANEL:   CBC Recent Labs  Lab 08/08/18 1900  WBC 10.6  HGB 15.2  HCT 45.3  PLT 358   ------------------------------------------------------------------------------------------------------------------  Chemistries  Recent Labs  Lab 08/08/18 1900 08/09/18 0311  08/13/18 0417  NA 142  --    < > 138  K 3.4*  --    < > 3.7  CL 111  --    < > 106  CO2 23  --    < > 26  GLUCOSE 88  --    < > 113*  BUN 10  --    < > 11  CREATININE 1.13  --    < > 0.86  CALCIUM 9.4  --    < > 9.0  MG  --  2.1  --   --   AST 22  --   --   --   ALT 15  --   --   --   ALKPHOS 51  --   --   --   BILITOT 1.1  --   --   --    < > = values in this interval not displayed.   ------------------------------------------------------------------------------------------------------------------  Cardiac Enzymes No results for input(s): TROPONINI in the last 168 hours. ------------------------------------------------------------------------------------------------------------------  RADIOLOGY:  No results found.  EKG:   Orders placed or performed  during the hospital encounter of 08/08/18  . ED EKG  . ED EKG  . EKG 12-Lead  . EKG 12-Lead    ASSESSMENT AND PLAN:   57 year old male with past medical history significant for substance abuse with cocaine and alcohol, prior history of strokes with no residual neurological deficits presents to hospital secondary to left-sided weakness and left-sided hemineglect  1.  Acute stroke-MRI confirming a large area of acute infarction  in posterior MCA distribution..with left sided weakness -Neuro consult, neurochecks -on aspirin and Brilinta- -On statin- dose increased as LDL >100 -PT/OT consults. -Carotid Dopplers with no hemodynamically significant stenosis.  Echocardiogram is done.  TEE was negative back in April 2019 - PT recommended SNF- awaited PASSAR for placement. - right shoulder pain- likely rotator cuff injury- X ray negative  2.  Hypertension-on losartan  3.  Hypokalemia- replaced  4.  Substance abuse-positive for cocaine in the urine this admission.  Significantly counseled against substance abuse -Snorts/inhales cocaine.  Does not do IV drugs  5.  DVT prophylaxis-Lovenox  Need rehab at discharge due to significant left-sided weakness.  Awaiting PASSR.   Has a bed at [facility]- remains weak, awaiting placement.   All the records are reviewed and case discussed with Care Management/Social Workerr. Management plans discussed with the patient, family and they are in agreement.  CODE STATUS: Full code  TOTAL TIME TAKING CARE OF THIS PATIENT: 34 minutes.   POSSIBLE D/C IN 2 DAYS, DEPENDING ON CLINICAL CONDITION.   Altamese Dilling M.D on 08/13/2018 at 8:51 PM  Between 7am to 6pm - Pager - [phone]  After 6pm go to [email or web address] - Social research officer, government  [facility]  Office  [phone]  CC: Primary care physician; Marisue Ivan, MD

## 2018-08-13 NOTE — Clinical Social Work Note (Signed)
CSW received call from Bur[place]on, admissions coordinator at [facility] regarding patient. Tammy states that they have received [facility] authorization for patient. CSW explained that we are still waiting for PASRR. CSW contacted PASRR and they state that patient will be evaluated by a mental health professional for level 2 PASRR which could take up to 3 more days. CSW will continue to follow for discharge planning.   Ruthe Mannan MSW, [address] [phone]

## 2018-08-13 NOTE — Progress Notes (Signed)
Occupational Therapy Treatment Patient Details Name: Cory Harvey MRN: [id number] DOB: 1961/05/04 Today's Date: 08/13/2018    History of present illness Pt is a 57 y.o. male with a known history of cocaine use, CVA p/w acute weakness (generalized + L-sided) x1d.  He stated that he was lying on the floor unable to get up. He does not remember how he got there. He denies falling. He states that his friend visited him, and found him down on the ground. His friend called EMS. Pt stated that he felt weak all over, but he was especially weak on his L side. He has no complaints apart from weakness. He had stuttering speech, but stated this is old. He exhibits L-sided weakness, as well as some minimal L-sided neglect on exam. MRI brain performed in ED (+) "Large region of acute/early subacute infarction within the right posterior MCA distribution. No associated hemorrhage identified, however, the susceptibility sequences are motion degraded and petechial hemorrhage may not be visible. Minimal associated local mass effect."  Assessment includes: CVA and hypokalemia.   OT comments  Pt seen for OT tx session this date, focused on table top visual scanning and attention activity. Pt up in recliner after working with PT. Visual scanning table top activity required pt to visually scan to L side of tray table to find playing cards and bring over to R side and sort into one of 4 piles using R hand (unable to do with L hand without max-total assist and multiple attempts). Pt initially required max verbal/tactile/visual cues with head turns to L to visually locate cards and max cues for identifying playing card and placing into the appopriate pile with <50% accuracy improving to 75% accuracy with mod-max verbal cues as activity progressed. Near end, pt utilizing compensatory strategies to sweep R hand across tray table to find cards on far L side instead of turning head and eyes. Pt continues to benefit from skilled OT,  will continue efforts while hospitalized. Continue to recommend STR.   Follow Up Recommendations  SNF    Equipment Recommendations  Other (comment)(TBD)    Recommendations for Other Services      Precautions / Restrictions Precautions Precautions: Fall Restrictions Weight Bearing Restrictions: No       Mobility Bed Mobility     General bed mobility comments: deferred, up in recliner for session               Balance                             ADL either performed or assessed with clinical judgement   ADL                                               Vision Baseline Vision/History: No visual deficits Vision Assessment?: Yes Ocular Range of Motion: Impaired-to be further tested in functional context Alignment/Gaze Preference: Gaze right Tracking/Visual Pursuits: Impaired - to be further tested in functional context;Requires cues, head turns, or add eye shifts to track Visual Fields: Left visual field deficit   Perception     Praxis      Cognition Arousal/Alertness: Awake/alert Behavior During Therapy: WFL for tasks assessed/performed Overall Cognitive Status: No family/caregiver present to determine baseline cognitive functioning  General Comments: Pt able to follow commands with mod-max cues to attend to L side.         Exercises Other Exercises Other Exercises: Visual scanning table top activity requiring pt to visually scan to L side of tray table to find playing cards and bring over to R side and sort into one of 4 piles using R hand (unable to do with L hand without max-total assist). Pt initially required max verbal/tactile/visual cues with head turns to L to visually locate cards and max cues for identifying playing card and placing into the appopriate pile with <50% accuracy improving to 75% accuracy with mod-max verbal cues as activity progressed. Near end, pt utilizing  compensatory strategies to sweep R hand across tray table to find cards on far L side instead of turning head and eyes.    Shoulder Instructions       General Comments      Pertinent Vitals/ Pain       Pain Assessment: No/denies pain  Home Living                                          Prior Functioning/Environment              Frequency  Min 3X/week        Progress Toward Goals  OT Goals(current goals can now be found in the care plan section)  Progress towards OT goals: Progressing toward goals  Acute Rehab OT Goals Patient Stated Goal: To get stronger OT Goal Formulation: With patient Time For Goal Achievement: 08/23/18 Potential to Achieve Goals: Good  Plan Discharge plan remains appropriate;Frequency remains appropriate    Co-evaluation                 AM-PAC PT "6 Clicks" Daily Activity     Outcome Measure   Help from another person eating meals?: A Lot Help from another person taking care of personal grooming?: A Lot Help from another person toileting, which includes using toliet, bedpan, or urinal?: A Lot Help from another person bathing (including washing, rinsing, drying)?: A Lot Help from another person to put on and taking off regular upper body clothing?: A Lot Help from another person to put on and taking off regular lower body clothing?: A Lot 6 Click Score: 12    End of Session    OT Visit Diagnosis: Other abnormalities of gait and mobility (R26.89);Hemiplegia and hemiparesis;Low vision, both eyes (H54.2) Hemiplegia - Right/Left: Left Hemiplegia - dominant/non-dominant: Dominant Hemiplegia - caused by: Cerebral infarction   Activity Tolerance Patient tolerated treatment well   Patient Left in chair;with call bell/phone within reach;with chair alarm set   Nurse Communication          Time: 1610-9604 OT Time Calculation (min): 27 min  Charges: OT General Charges $OT Visit: 1 Visit OT  Treatments $Neuromuscular Re-education: 23-37 mins  Richrd Prime, MPH, MS, OTR/L ascom [phone] 08/13/18, 11:17 AM

## 2018-08-14 LAB — CBC
HEMATOCRIT: 49.9 % (ref 40.0–52.0)
HEMOGLOBIN: 16.7 g/dL (ref 13.0–18.0)
MCH: 28.4 pg (ref 26.0–34.0)
MCHC: 33.5 g/dL (ref 32.0–36.0)
MCV: 84.8 fL (ref 80.0–100.0)
Platelets: 406 10*3/uL (ref 150–440)
RBC: 5.89 MIL/uL (ref 4.40–5.90)
RDW: 14.7 % — AB (ref 11.5–14.5)
WBC: 9.7 10*3/uL (ref 3.8–10.6)

## 2018-08-14 NOTE — Progress Notes (Signed)
Physical Therapy Treatment Patient Details Name: Cory Harvey MRN: [id number] DOB: 07-Mar-1961 Today's Date: 08/14/2018    History of Present Illness Pt is a 57 y.o. male with a known history of cocaine use, CVA p/w acute weakness (generalized + L-sided) x1d.  He stated that he was lying on the floor unable to get up. He does not remember how he got there. He denies falling. He states that his friend visited him, and found him down on the ground. His friend called EMS. Pt stated that he felt weak all over, but he was especially weak on his L side. He has no complaints apart from weakness. He had stuttering speech, but stated this is old. He exhibits L-sided weakness, as well as some minimal L-sided neglect on exam. MRI brain performed in ED (+) "Large region of acute/early subacute infarction within the right posterior MCA distribution. No associated hemorrhage identified, however, the susceptibility sequences are motion degraded and petechial hemorrhage may not be visible. Minimal associated local mass effect."  Assessment includes: CVA and hypokalemia.    PT Comments    Pt presents with deficits in strength, transfers, mobility, gait, balance, and activity tolerance but made good progress towards goals this session.  Pt is pleasant and willing/eager to participate in all aspects of the session.  Pt required significantly less assistance with sup to sit along with less cues for sequencing.  Pt demonstrated good concentric strength during sit to stand and was able to amb 10', 5', and 3' with +2 HHA.  Pt continues to require extensive cues to lift and advance the LLE during ambulation and remains at an elevated risk for falls.  Pt will benefit from PT services in a SNF setting upon discharge to safely address above deficits for decreased caregiver assistance and eventual return to PLOF.     Follow Up Recommendations  SNF     Equipment Recommendations  Other (comment)(TBD at next venue of care)     Recommendations for Other Services       Precautions / Restrictions Precautions Precautions: Fall Restrictions Weight Bearing Restrictions: No    Mobility  Bed Mobility Overal bed mobility: Needs Assistance Bed Mobility: Supine to Sit     Supine to sit: Min assist     General bed mobility comments: Decreased assistance required during sup to sit this session along with decreased cues for sequencing  Transfers Overall transfer level: Needs assistance Equipment used: 2 person hand held assist   Sit to Stand: Independent;Min guard         General transfer comment: Improved control and stability with transfers with pt able to maintain static standing with CGA only  Ambulation/Gait Ambulation/Gait assistance: +2 physical assistance;Mod assist;Min assist Gait Distance (Feet): 10 Feet Assistive device: 2 person hand held assist Gait Pattern/deviations: Step-to pattern;Shuffle;Ataxic     General Gait Details: Pt able to amb 10' x 1, 5' x 1, and 3' x 1 this session with +2 HHA min to mod A with extensive verbal and tactile cues for advancing the LLE   Stairs             Wheelchair Mobility    Modified Rankin (Stroke Patients Only)       Balance Overall balance assessment: Needs assistance Sitting-balance support: Single extremity supported;Feet supported Sitting balance-Leahy Scale: Fair     Standing balance support: Bilateral upper extremity supported;During functional activity Standing balance-Leahy Scale: Fair Standing balance comment: Fair static standing balance and poor dynamic standing balance with BUE support  Cognition Arousal/Alertness: Awake/alert Behavior During Therapy: WFL for tasks assessed/performed Overall Cognitive Status: No family/caregiver present to determine baseline cognitive functioning                                        Exercises Total Joint Exercises Ankle  Circles/Pumps: AROM;Both;10 reps Short Arc Quad: AROM;AAROM;Left;10 reps;15 reps Heel Slides: AROM;Both;5 reps Long Arc Quad: AROM;Both;10 reps Knee Flexion: AROM;Both;10 reps Marching in Standing: AROM;Both;Seated;10 reps Other Exercises Other Exercises: Positioning education for LUE shoulder and hand    General Comments        Pertinent Vitals/Pain Pain Assessment: No/denies pain    Home Living                      Prior Function            PT Goals (current goals can now be found in the care plan section) Progress towards PT goals: Progressing toward goals    Frequency    7X/week      PT Plan Current plan remains appropriate    Co-evaluation              AM-PAC PT "6 Clicks" Daily Activity  Outcome Measure                   End of Session Equipment Utilized During Treatment: Gait belt Activity Tolerance: Patient tolerated treatment well Patient left: in chair;with call bell/phone within reach;with chair alarm set Nurse Communication: Mobility status PT Visit Diagnosis: Unsteadiness on feet (R26.81);Muscle weakness (generalized) (M62.81);Difficulty in walking, not elsewhere classified (R26.2);Hemiplegia and hemiparesis Hemiplegia - Right/Left: Left Hemiplegia - dominant/non-dominant: Dominant Hemiplegia - caused by: Unspecified     Time: 1040-1104 PT Time Calculation (min) (ACUTE ONLY): 24 min  Charges:  $Gait Training: 8-22 mins $Therapeutic Exercise: 8-22 mins                     D. Scott Dannetta Lekas PT, DPT 08/14/18, 11:35 AM

## 2018-08-14 NOTE — Progress Notes (Signed)
[employer] - [place] at [facility]   PATIENT NAME: Cory Harvey    MR#:  [id number]  DATE OF BIRTH:  1961-06-08  SUBJECTIVE:  CHIEF COMPLAINT:   Chief Complaint  Patient presents with  . Weakness   - left sided hemi-neglect persist, left sided weakness, also blurred vision - awaiting rehab placement. More stronger today.  REVIEW OF SYSTEMS:  Review of Systems  Constitutional: Negative for chills, fever and malaise/fatigue.  HENT: Negative for ear discharge, hearing loss and nosebleeds.   Eyes: Negative for blurred vision and double vision.  Respiratory: Negative for cough, shortness of breath and wheezing.   Cardiovascular: Negative for chest pain and palpitations.  Gastrointestinal: Negative for abdominal pain, constipation, diarrhea, nausea and vomiting.  Genitourinary: Negative for dysuria.  Musculoskeletal: Negative for myalgias.  Neurological: Positive for sensory change, speech change and focal weakness. Negative for dizziness, seizures and headaches.  Psychiatric/Behavioral: Negative for depression.    DRUG ALLERGIES:  No Known Allergies  VITALS:  Blood pressure 128/87, pulse 97, temperature 97.8 F (36.6 C), temperature source Oral, resp. rate 20, height 5\' 8"  (1.727 m), weight 71.3 kg, SpO2 96 %.  PHYSICAL EXAMINATION:  Physical Exam  GENERAL:  57 y.o.-year-old patient lying in the bed with no acute distress.  EYES: Pupils equal, round, reactive to light and accommodation. No scleral icterus. Extraocular muscles intact.  HEENT: Head atraumatic, normocephalic. Oropharynx and nasopharynx clear.  NECK:  Supple, no jugular venous distention. No thyroid enlargement, no tenderness.  LUNGS: Normal breath sounds bilaterally, no wheezing, rales,rhonchi or crepitation. No use of accessory muscles of respiration.  CARDIOVASCULAR: S1, S2 normal. No murmurs, rubs, or gallops.  ABDOMEN: Soft, nontender, nondistended. Bowel sounds present. No organomegaly  or mass.  EXTREMITIES: No pedal edema, cyanosis, or clubbing.  NEUROLOGIC: Cranial nerves II through XII are intact. Decreased visual acuity.  Left-sided hemineglect noted.  strength is 3-4/5-in left upper and lower extremities whereas right side is 5 x 5..limited right shoulder movement due to pain. Sensation intact. Gait not checked.  PSYCHIATRIC: The patient is alert and oriented x 3.  SKIN: No obvious rash, lesion, or ulcer.    LABORATORY PANEL:   CBC Recent Labs  Lab 08/14/18 0348  WBC 9.7  HGB 16.7  HCT 49.9  PLT 406   ------------------------------------------------------------------------------------------------------------------  Chemistries  Recent Labs  Lab 08/08/18 1900 08/09/18 0311  08/13/18 0417  NA 142  --    < > 138  K 3.4*  --    < > 3.7  CL 111  --    < > 106  CO2 23  --    < > 26  GLUCOSE 88  --    < > 113*  BUN 10  --    < > 11  CREATININE 1.13  --    < > 0.86  CALCIUM 9.4  --    < > 9.0  MG  --  2.1  --   --   AST 22  --   --   --   ALT 15  --   --   --   ALKPHOS 51  --   --   --   BILITOT 1.1  --   --   --    < > = values in this interval not displayed.   ------------------------------------------------------------------------------------------------------------------  Cardiac Enzymes No results for input(s): TROPONINI in the last 168 hours. ------------------------------------------------------------------------------------------------------------------  RADIOLOGY:  No results found.  EKG:   Orders placed or  performed during the hospital encounter of 08/08/18  . ED EKG  . ED EKG  . EKG 12-Lead  . EKG 12-Lead    ASSESSMENT AND PLAN:   57 year old male with past medical history significant for substance abuse with cocaine and alcohol, prior history of strokes with no residual neurological deficits presents to hospital secondary to left-sided weakness and left-sided hemineglect  1.  Acute stroke-MRI confirming a large area of acute  infarction in posterior MCA distribution..with left sided weakness -Neuro consult, neurochecks -on aspirin and Brilinta- -On statin- dose increased as LDL >100 -PT/OT consults. -Carotid Dopplers with no hemodynamically significant stenosis.  Echocardiogram is done.  TEE was negative back in April 2019 - PT recommended SNF- awaited PASSAR for placement. - right shoulder pain- likely rotator cuff injury- X ray negative  2.  Hypertension-on losartan  3.  Hypokalemia- replaced  4.  Substance abuse-positive for cocaine in the urine this admission.  Significantly counseled against substance abuse -Snorts/inhales cocaine.  Does not do IV drugs  5.  DVT prophylaxis-Lovenox  Need rehab at discharge due to significant left-sided weakness.  Awaiting PASSR.   Has a bed at [facility]- remains weak, awaiting placement. Somewhat stronger today, cont to work with PT, re-eval tomorrow, if gets more strong- then can go home with HHA>  All the records are reviewed and case discussed with Care Management/Social Workerr. Management plans discussed with the patient, family and they are in agreement.  CODE STATUS: Full code  TOTAL TIME TAKING CARE OF THIS PATIENT: 34 minutes.   POSSIBLE D/C IN 2 DAYS, DEPENDING ON CLINICAL CONDITION.   Altamese Dilling M.D on 08/14/2018 at 2:54 PM  Between 7am to 6pm - Pager - [phone]  After 6pm go to [email or web address] - Social research officer, government  [facility]  Office  [phone]  CC: Primary care physician; Marisue Ivan, MD

## 2018-08-14 NOTE — Care Management Important Message (Signed)
Important Message  Patient Details  Name: Cory Harvey MRN: [id number] Date of Birth: 04-08-1961   Medicare Important Message Given:  Yes    Olegario Messier A Erminie Foulks 08/14/2018, 11:02 AM

## 2018-08-15 NOTE — Plan of Care (Signed)
Pt has been asking about whether he will get better and he did not know stroke could be this bad. Discuss with patients risk factors and educate patient that CVA can be deadly. Talk with patient about things he can do while in the hospital.

## 2018-08-15 NOTE — Progress Notes (Signed)
[facility] [place] at [facility]   PATIENT NAME: Cory Harvey    MR#:  [id number]  DATE OF BIRTH:  1961-07-16  SUBJECTIVE:  CHIEF COMPLAINT:   Chief Complaint  Patient presents with  . Weakness   - left sided hemi-neglect persist, left sided weakness, also blurred vision - awaiting rehab placement. More stronger today.  REVIEW OF SYSTEMS:  Review of Systems  Constitutional: Negative for chills, fever and malaise/fatigue.  HENT: Negative for ear discharge, hearing loss and nosebleeds.   Eyes: Negative for blurred vision and double vision.  Respiratory: Negative for cough, shortness of breath and wheezing.   Cardiovascular: Negative for chest pain and palpitations.  Gastrointestinal: Negative for abdominal pain, constipation, diarrhea, nausea and vomiting.  Genitourinary: Negative for dysuria.  Musculoskeletal: Negative for myalgias.  Neurological: Positive for sensory change, speech change and focal weakness. Negative for dizziness, seizures and headaches.  Psychiatric/Behavioral: Negative for depression.    DRUG ALLERGIES:  No Known Allergies  VITALS:  Blood pressure 124/82, pulse 73, temperature 98.2 F (36.8 C), temperature source Oral, resp. rate 20, height 5\' 8"  (1.727 m), weight 71.3 kg, SpO2 99 %.  PHYSICAL EXAMINATION:  Physical Exam  GENERAL:  57 y.o.-year-old patient lying in the bed with no acute distress.  EYES: Pupils equal, round, reactive to light and accommodation. No scleral icterus. Extraocular muscles intact.  HEENT: Head atraumatic, normocephalic. Oropharynx and nasopharynx clear.  NECK:  Supple, no jugular venous distention. No thyroid enlargement, no tenderness.  LUNGS: Normal breath sounds bilaterally, no wheezing, rales,rhonchi or crepitation. No use of accessory muscles of respiration.  CARDIOVASCULAR: S1, S2 normal. No murmurs, rubs, or gallops.  ABDOMEN: Soft, nontender, nondistended. Bowel sounds present. No organomegaly  or mass.  EXTREMITIES: No pedal edema, cyanosis, or clubbing.  NEUROLOGIC: Cranial nerves II through XII are intact. Decreased visual acuity.  Left-sided hemineglect noted.  strength is 3-4/5-in left upper and lower extremities whereas right side is 5 x 5..limited right shoulder movement due to pain. Sensation intact. Gait not checked.  PSYCHIATRIC: The patient is alert and oriented x 3.  SKIN: No obvious rash, lesion, or ulcer.    LABORATORY PANEL:   CBC Recent Labs  Lab 08/14/18 0348  WBC 9.7  HGB 16.7  HCT 49.9  PLT 406   ------------------------------------------------------------------------------------------------------------------  Chemistries  Recent Labs  Lab 08/08/18 1900 08/09/18 0311  08/13/18 0417  NA 142  --    < > 138  K 3.4*  --    < > 3.7  CL 111  --    < > 106  CO2 23  --    < > 26  GLUCOSE 88  --    < > 113*  BUN 10  --    < > 11  CREATININE 1.13  --    < > 0.86  CALCIUM 9.4  --    < > 9.0  MG  --  2.1  --   --   AST 22  --   --   --   ALT 15  --   --   --   ALKPHOS 51  --   --   --   BILITOT 1.1  --   --   --    < > = values in this interval not displayed.   ------------------------------------------------------------------------------------------------------------------  Cardiac Enzymes No results for input(s): TROPONINI in the last 168 hours. ------------------------------------------------------------------------------------------------------------------  RADIOLOGY:  No results found.  EKG:   Orders placed or  performed during the hospital encounter of 08/08/18  . ED EKG  . ED EKG  . EKG 12-Lead  . EKG 12-Lead    ASSESSMENT AND PLAN:   57 year old male with past medical history significant for substance abuse with cocaine and alcohol, prior history of strokes with no residual neurological deficits presents to hospital secondary to left-sided weakness and left-sided hemineglect  1.  Acute stroke-MRI confirming a large area of acute  infarction in posterior MCA distribution..with left sided weakness -Neuro consult, neurochecks -on aspirin and Brilinta- -On statin- dose increased as LDL >100 -PT/OT consults. -Carotid Dopplers with no hemodynamically significant stenosis.  Echocardiogram is done.  TEE was negative back in April 2019 - PT recommended SNF- awaited [facility] for placement. - right shoulder pain- likely rotator cuff injury- X ray negative - Overall strength improving, still have imbalance and need support to get up and walk, cont to wait for SNF.  2.  Hypertension-on losartan  3.  Hypokalemia- replaced  4.  Substance abuse-positive for cocaine in the urine this admission.  Significantly counseled against substance abuse -Snorts/inhales cocaine.  Does not do IV drugs  5.  DVT prophylaxis-Lovenox  Need rehab at discharge due to significant left-sided weakness.  Awaiting [facility].   Has a bed at [facility]- remains weak, awaiting placement.  All the records are reviewed and case discussed with Care Management/Social Workerr. Management plans discussed with the patient, family and they are in agreement.  CODE STATUS: Full code  TOTAL TIME TAKING CARE OF THIS PATIENT: 34 minutes.   POSSIBLE D/C IN 2 DAYS, DEPENDING ON CLINICAL CONDITION.   Altamese Dilling M.D on 08/15/2018 at 3:10 PM  Between 7am to 6pm - Pager - [phone]  After 6pm go to [email or web address] - Social research officer, government  [facility]  Office  [phone]  CC: Primary care physician; Marisue Ivan, MD

## 2018-08-15 NOTE — Progress Notes (Signed)
Physical Therapy Treatment Patient Details Name: Cory Harvey MRN: [id number] DOB: 10-Apr-1961 Today's Date: 08/15/2018    History of Present Illness Pt is a 57 y.o. male with a known history of cocaine use, CVA p/w acute weakness (generalized + L-sided) x1d.  He stated that he was lying on the floor unable to get up. He does not remember how he got there. He denies falling. He states that his friend visited him, and found him down on the ground. His friend called EMS. Pt stated that he felt weak all over, but he was especially weak on his L side. He has no complaints apart from weakness. He had stuttering speech, but stated this is old. He exhibits L-sided weakness, as well as some minimal L-sided neglect on exam. MRI brain performed in ED (+) "Large region of acute/early subacute infarction within the right posterior MCA distribution. No associated hemorrhage identified, however, the susceptibility sequences are motion degraded and petechial hemorrhage may not be visible. Minimal associated local mass effect."  Assessment includes: CVA and hypokalemia.    PT Comments    Pt initially refusing session reporting general fatigue but agrees with light encouragement.  Participated in exercises as described below.  Pt able to transition to sitting today with rail and only light assist.  Sat unassisted initially but then leaned down on R side and seemed unable to sit without assist.  Stood with general ease but needed immediate assist one standing for balance.  He was able to take several steps to recliner at bedside but had difficulty stepping backwards due to post lean and feet sliding in grippie socks.  Pt may do better in sneakers for gait/mobility.     Follow Up Recommendations  SNF     Equipment Recommendations  Other (comment)    Recommendations for Other Services       Precautions / Restrictions Precautions Precautions: Fall Restrictions Weight Bearing Restrictions: No     Mobility  Bed Mobility Overal bed mobility: Needs Assistance Bed Mobility: Supine to Sit Rolling: Min guard;Min assist         General bed mobility comments: relied on rail but was able to come upright with only light assist.  Transfers Overall transfer level: Needs assistance Equipment used: 2 person hand held assist Transfers: Sit to/from Stand Sit to Stand: Min guard         General transfer comment: good control to stand but once up requires assist +2 for balance/safety  Ambulation/Gait Ambulation/Gait assistance: +2 physical assistance;Mod assist;Min assist Gait Distance (Feet): 3 Feet Assistive device: 2 person hand held assist Gait Pattern/deviations: Step-to pattern;Shuffle;Ataxic   Gait velocity interpretation: <1.31 ft/sec, indicative of household ambulator General Gait Details: Pt able to take several small steps to chair but remains generally unsafe.  Pt in hospital grippie socks and feet sliding at times due to post lean.  While he is able to take a few steps forward, backward gait is unsafe and needs chair brought up to him for safety.     Stairs             Wheelchair Mobility    Modified Rankin (Stroke Patients Only)       Balance Overall balance assessment: Needs assistance Sitting-balance support: Single extremity supported;Feet supported Sitting balance-Leahy Scale: Fair Sitting balance - Comments: Pt able to sit initially without assist but after a few moments, he leaned over to R side onto bed and needed assist to sit back upright.     Standing balance  support: Bilateral upper extremity supported;During functional activity Standing balance-Leahy Scale: Poor Standing balance comment: Fair static standing balance and poor dynamic standing balance with BUE support                            Cognition Arousal/Alertness: Awake/alert Behavior During Therapy: WFL for tasks assessed/performed Overall Cognitive Status: No  family/caregiver present to determine baseline cognitive functioning                                 General Comments: Pt able to follow commands with mod-max cues to attend to L side.       Exercises Other Exercises Other Exercises: BLE AAROM due to difficulty following directions and for motor planning for ankle pumps, heel slides, SLR, and ab/add in supine x 10.    General Comments        Pertinent Vitals/Pain Pain Assessment: No/denies pain    Home Living                      Prior Function            PT Goals (current goals can now be found in the care plan section) Progress towards PT goals: Progressing toward goals    Frequency    7X/week      PT Plan Current plan remains appropriate    Co-evaluation              AM-PAC PT "6 Clicks" Daily Activity  Outcome Measure  Difficulty turning over in bed (including adjusting bedclothes, sheets and blankets)?: Unable Difficulty moving from lying on back to sitting on the side of the bed? : Unable Difficulty sitting down on and standing up from a chair with arms (e.g., wheelchair, bedside commode, etc,.)?: Unable Help needed moving to and from a bed to chair (including a wheelchair)?: Total Help needed walking in hospital room?: Total Help needed climbing 3-5 steps with a railing? : Total 6 Click Score: 6    End of Session Equipment Utilized During Treatment: Gait belt Activity Tolerance: Patient tolerated treatment well Patient left: in chair;with call bell/phone within reach;with chair alarm set Nurse Communication: Mobility status Hemiplegia - Right/Left: Left Hemiplegia - dominant/non-dominant: Dominant Hemiplegia - caused by: Unspecified     Time: 0131-4388 PT Time Calculation (min) (ACUTE ONLY): 19 min  Charges:  $Therapeutic Exercise: 8-22 mins                     Danielle Dess, PTA 08/15/18, 11:17 AM

## 2018-08-16 MED ORDER — ATORVASTATIN CALCIUM 40 MG PO TABS: 40.0000 mg | ORAL_TABLET | Freq: Every day | ORAL | 0 refills | Status: AC

## 2018-08-16 MED ORDER — BISACODYL 5 MG PO TBEC: 5.0000 mg | DELAYED_RELEASE_TABLET | Freq: Every day | ORAL | 0 refills | Status: AC | PRN

## 2018-08-16 MED ORDER — SENNOSIDES-DOCUSATE SODIUM 8.6-50 MG PO TABS: 1.0000 | ORAL_TABLET | Freq: Every evening | ORAL | 0 refills | Status: AC | PRN

## 2018-08-16 NOTE — Discharge Summary (Signed)
[facility] [place] at [facility]   PATIENT NAME: Cory Harvey    MR#:  [id number]  DATE OF BIRTH:  [birth year]  DATE OF ADMISSION:  08/08/2018 ADMITTING PHYSICIAN: Barbaraann Rondo, MD  DATE OF DISCHARGE: 08/16/2018   PRIMARY CARE PHYSICIAN: Marisue Ivan, MD    ADMISSION DIAGNOSIS:  Cocaine abuse (HCC) [F14.10] Weakness [R53.1] MRI contraindicated due to metal implant [Z53.09] Cerebrovascular accident (CVA), unspecified mechanism (HCC) [I63.9]  DISCHARGE DIAGNOSIS:  Active Problems:   Arterial ischemic stroke, MCA (middle cerebral artery), right, acute (HCC)   SECONDARY DIAGNOSIS:   Past Medical History:  Diagnosis Date  . Depression   . History of CVA (cerebrovascular accident)   . Hypertension   . Stroke (HCC)    x 4  . Tobacco abuse     HOSPITAL COURSE:   57 year old male with past medical history significant for substance abuse with cocaine and alcohol, prior history of strokes with no residual neurological deficits presents to hospital secondary to left-sided weakness and left-sided hemineglect  1.  Acute stroke-MRI confirming a large area of acute infarction in posterior MCA distribution..with left sided weakness -Neuro consult, neurochecks -on aspirin and Brilinta- -On statin- dose increased as LDL >100 -PT/OT consults. -Carotid Dopplers with no hemodynamically significant stenosis.  Echocardiogram is done.  TEE was negative back in April 2019 - PT recommended SNF- awaited [facility] for placement. - right shoulder pain- likely rotator cuff injury- X ray negative - Overall strength improving, still have imbalance and need support to get up and walk, cont for SNF.  2.  Hypertension-on losartan  3.  Hypokalemia- replaced  4.  Substance abuse-positive for cocaine in the urine this admission.  Significantly counseled against substance abuse -Snorts/inhales cocaine.  Does not do IV drugs  5.  DVT  prophylaxis-Lovenox  DISCHARGE CONDITIONS:   Stable.  CONSULTS OBTAINED:  Treatment Team:  Barbaraann Rondo, MD Thana Farr, MD  DRUG ALLERGIES:  No Known Allergies  DISCHARGE MEDICATIONS:   Allergies as of 08/16/2018   No Known Allergies     Medication List    STOP taking these medications   clopidogrel 75 MG tablet Commonly known as:  PLAVIX     TAKE these medications   aspirin 81 MG chewable tablet Chew 1 tablet (81 mg total) by mouth daily.   atorvastatin 40 MG tablet Commonly known as:  LIPITOR Take 1 tablet (40 mg total) by mouth at bedtime. What changed:    medication strength  how much to take   bisacodyl 5 MG EC tablet Commonly known as:  DULCOLAX Take 1 tablet (5 mg total) by mouth daily as needed for moderate constipation.   losartan 25 MG tablet Commonly known as:  COZAAR Take 1 tablet (25 mg total) by mouth daily.   senna-docusate 8.6-50 MG tablet Commonly known as:  Senokot-S Take 1 tablet by mouth at bedtime as needed for mild constipation.   ticagrelor 90 MG Tabs tablet Commonly known as:  BRILINTA Take 1 tablet (90 mg total) by mouth 2 (two) times daily.        DISCHARGE INSTRUCTIONS:    Follow with PMD in 1 week.  If you experience worsening of your admission symptoms, develop shortness of breath, life threatening emergency, suicidal or homicidal thoughts you must seek medical attention immediately by calling 911 or calling your MD immediately  if symptoms less severe.  You Must read complete instructions/literature along with all the possible adverse reactions/side effects for all the Medicines you take  and that have been prescribed to you. Take any new Medicines after you have completely understood and accept all the possible adverse reactions/side effects.   Please note  You were cared for by a hospitalist during your hospital stay. If you have any questions about your discharge medications or the care you received  while you were in the hospital after you are discharged, you can call the unit and asked to speak with the hospitalist on call if the hospitalist that took care of you is not available. Once you are discharged, your primary care physician will handle any further medical issues. Please note that NO REFILLS for any discharge medications will be authorized once you are discharged, as it is imperative that you return to your primary care physician (or establish a relationship with a primary care physician if you do not have one) for your aftercare needs so that they can reassess your need for medications and monitor your lab values.    Today   CHIEF COMPLAINT:   Chief Complaint  Patient presents with  . Weakness    HISTORY OF PRESENT ILLNESS:  Cory Harvey  is a 57 y.o. male with a known history of cocaine use, CVA p/w acute weakness (generalized + L-sided) x1d. Pt is unable to provide much Hx. He states that @~1600PM on Thursday 08/08/2018, he was lying on the floor unable to get up. He does not remember how he got there. He denies falling. He states that his friend visited him, and found him down on the ground. His friend called EMS. Pt states that he feels weak all over, but he is especially weak on his L side. He has no complaints apart from weakness. He states he felt fine in the morning. He states that he does not remember when he last used cocaine, but it was "not today", though he does admit to continued cocaine use. He is filthy, and smells of urine, suggestive of self care deficit. He states he takes all of his home medications, including his antiplatelet agents. He states that he smokes 5 cigarettes per day. He has stuttering speech, but states this is old. He exhibits L-sided weakness, as well as some minimal L-sided neglect on exam. MRI brain performed in ED (+) "Large region of acute/early subacute infarction within the right posterior MCA distribution. No associated hemorrhage identified,  however, the susceptibility sequences are motion degraded and petechial hemorrhage may not be visible. Minimal associated local mass effect."   VITAL SIGNS:  Blood pressure 117/90, pulse 73, temperature 98.2 F (36.8 C), resp. rate 18, height 5\' 8"  (1.727 m), weight 71.3 kg, SpO2 100 %.  I/O:    Intake/Output Summary (Last 24 hours) at 08/16/2018 1005 Last data filed at 08/15/2018 1830 Gross per 24 hour  Intake 240 ml  Output -  Net 240 ml    PHYSICAL EXAMINATION:   GENERAL:  57 y.o.-year-old patient lying in the bed with no acute distress.  EYES: Pupils equal, round, reactive to light and accommodation. No scleral icterus. Extraocular muscles intact.  HEENT: Head atraumatic, normocephalic. Oropharynx and nasopharynx clear.  NECK:  Supple, no jugular venous distention. No thyroid enlargement, no tenderness.  LUNGS: Normal breath sounds bilaterally, no wheezing, rales,rhonchi or crepitation. No use of accessory muscles of respiration.  CARDIOVASCULAR: S1, S2 normal. No murmurs, rubs, or gallops.  ABDOMEN: Soft, nontender, nondistended. Bowel sounds present. No organomegaly or mass.  EXTREMITIES: No pedal edema, cyanosis, or clubbing.  NEUROLOGIC: Cranial nerves II through XII are  intact. Decreased visual acuity.  Left-sided hemineglect noted.  strength is 3-4/5-in left upper and lower extremities whereas right side is 5 x 5..limited right shoulder movement due to pain. Sensation intact. Gait not checked.  PSYCHIATRIC: The patient is alert and oriented x 3.  SKIN: No obvious rash, lesion, or ulcer.   DATA REVIEW:   CBC Recent Labs  Lab 08/14/18 0348  WBC 9.7  HGB 16.7  HCT 49.9  PLT 406    Chemistries  Recent Labs  Lab 08/13/18 0417  NA 138  K 3.7  CL 106  CO2 26  GLUCOSE 113*  BUN 11  CREATININE 0.86  CALCIUM 9.0    Cardiac Enzymes No results for input(s): TROPONINI in the last 168 hours.  Microbiology Results  Results for orders placed or performed during the  hospital encounter of 05/25/18  MRSA PCR Screening     Status: None   Collection Time: 05/26/18  6:00 AM  Result Value Ref Range Status   MRSA by PCR NEGATIVE NEGATIVE Final    Comment:        The GeneXpert MRSA Assay (FDA approved for NASAL specimens only), is one component of a comprehensive MRSA colonization surveillance program. It is not intended to diagnose MRSA infection nor to guide or monitor treatment for MRSA infections. Performed at [facility], [address], [place], [place] [address]     RADIOLOGY:  No results found.  EKG:   Orders placed or performed during the hospital encounter of 08/08/18  . ED EKG  . ED EKG  . EKG 12-Lead  . EKG 12-Lead      Management plans discussed with the patient, family and they are in agreement.  CODE STATUS:     Code Status Orders  (From admission, onward)         Start     Ordered   08/09/18 0245  Full code  Continuous     08/09/18 0244        Code Status History    Date Active Date Inactive Code Status Order ID Comments User Context   05/25/2018 0727 05/27/2018 1051 Full Code [id number]  Julieanne Cotton, MD Inpatient   05/25/2018 0232 05/25/2018 0727 Full Code [id number]  Rejeana Brock, MD ED   03/29/2018 1351 04/04/2018 1230 Full Code [id number]  Charlton Amor, PA-C Inpatient   03/29/2018 1351 03/29/2018 1351 Full Code [id number]  Charlton Amor, PA-C Inpatient   03/26/2018 1845 03/29/2018 1349 Full Code [id number]  Ihor Austin, MD Inpatient   09/02/2017 1936 09/04/2017 1722 Full Code [id number]  Marguarite Arbour, MD Inpatient      TOTAL TIME TAKING CARE OF THIS PATIENT: 35 minutes.    Altamese Dilling M.D on 08/16/2018 at 10:05 AM  Between 7am to 6pm - Pager - [phone]  After 6pm go to [email or web address] - password [email or web address]  [facility]  Office  [phone]  CC: Primary care physician; Marisue Ivan, MD   Note: This dictation was prepared with Dragon  dictation along with smaller phrase technology. Any transcriptional errors that result from this process are unintentional.

## 2018-08-16 NOTE — Progress Notes (Signed)
Pt being discharged to [facility], report called to [facility], EMS to transport, pt with no complaints

## 2018-08-16 NOTE — Care Management Important Message (Signed)
Important Message  Patient Details  Name: Cory Harvey MRN: [id number] Date of Birth: 1961-01-01   Medicare Important Message Given:  Yes    Olegario Messier A [name] 08/16/2018, 10:54 AM

## 2018-08-16 NOTE — Progress Notes (Signed)
Occupational Therapy Treatment Patient Details Name: Cory Harvey MRN: [id number] DOB: Aug 02, 1961 Today's Date: 08/16/2018    History of present illness Pt is a 57 y.o. male with a known history of cocaine use, CVA p/w acute weakness (generalized + L-sided) x1d.  He stated that he was lying on the floor unable to get up. He does not remember how he got there. He denies falling. He states that his friend visited him, and found him down on the ground. His friend called EMS. Pt stated that he felt weak all over, but he was especially weak on his L side. He has no complaints apart from weakness. He had stuttering speech, but stated this is old. He exhibits L-sided weakness, as well as some minimal L-sided neglect on exam. MRI brain performed in ED (+) "Large region of acute/early subacute infarction within the right posterior MCA distribution. No associated hemorrhage identified, however, the susceptibility sequences are motion degraded and petechial hemorrhage may not be visible. Minimal associated local mass effect."  Assessment includes: CVA and hypokalemia.   OT comments  Pt. continues to present with decreased left sided awareness, and required extensive cuing to attend to the left side. Pt. worked on visual scanning tasks with extensive cues to the left side. Pt. tolerated ROM to the LUE, and required extensive cuing for self ROM to increase awareness to the lleft hand, and arm. Pt. continues to benefit from OT services to work on improving left sided awareness, visual scanning and visual search strategies, ADL training, A/E training, LUE functioning, and pt. education about home modification, and DME. Pt. would benefit from SNF level of care upon discharge. Pt. could benefit from follow-up OT services at discharge.   Follow Up Recommendations  SNF    Equipment Recommendations  Other (comment)    Recommendations for Other Services      Precautions / Restrictions Precautions Precautions:  Fall Restrictions Weight Bearing Restrictions: No       Mobility    Balance                       ADL either performed or assessed with clinical judgement   ADL   Eating/Feeding: Maximal assistance   Grooming: Moderate assistance Grooming Details (indicate cue type and reason): using the RUE Upper Body Bathing: Bed level;Maximal assistance   Lower Body Bathing: Bed level;Maximal assistance   Upper Body Dressing : Bed level;Maximal assistance   Lower Body Dressing: Bed level;Maximal assistance                 General ADL Comments: Max cues for left sided awareness.     Vision Baseline Vision/History: No visual deficits Alignment/Gaze Preference: Gaze right Tracking/Visual Pursuits: Requires cues, head turns, or add eye shifts to track;Impaired - to be further tested in functional context Visual Fields: Left visual field deficit   Perception     Praxis      Cognition Arousal/Alertness: Awake/alert Behavior During Therapy: WFL for tasks assessed/performed Overall Cognitive Status: No family/caregiver present to determine baseline cognitive functioning                                 General Comments: Pt able to follow commands with mod-max cues to attend to L side.         Exercises   Shoulder Instructions       General Comments      Pertinent Vitals/  Pain       Pain Assessment: No/denies pain  Home Living                                          Prior Functioning/Environment              Frequency  Min 3X/week        Progress Toward Goals  OT Goals(current goals can now be found in the care plan section)  Progress towards OT goals: Progressing toward goals  Acute Rehab OT Goals Patient Stated Goal: To get stronger OT Goal Formulation: With patient Potential to Achieve Goals: Good  Plan Discharge plan remains appropriate;Frequency remains appropriate    Co-evaluation                  AM-PAC PT "6 Clicks" Daily Activity     Outcome Measure   Help from another person eating meals?: A Lot Help from another person taking care of personal grooming?: A Lot   Help from another person bathing (including washing, rinsing, drying)?: A Lot Help from another person to put on and taking off regular upper body clothing?: A Lot Help from another person to put on and taking off regular lower body clothing?: A Lot 6 Click Score: 10    End of Session    OT Visit Diagnosis: Other abnormalities of gait and mobility (R26.89);Hemiplegia and hemiparesis;Low vision, both eyes (H54.2) Hemiplegia - Right/Left: Left Hemiplegia - dominant/non-dominant: Dominant Hemiplegia - caused by: Cerebral infarction   Activity Tolerance Patient tolerated treatment well   Patient Left in bed   Nurse Communication          Time: 0930-1000 OT Time Calculation (min): 30 min  Charges: OT General Charges $OT Visit: 1 Visit OT Treatments $Self Care/Home Management : 23-37 mins  Olegario Messier, MS, OTR/L  Olegario Messier 08/16/2018, 11:27 AM

## 2018-08-16 NOTE — Clinical Social Work Note (Signed)
Patient is medically ready for discharge and PASRR has been obtained. Patient will discharge to [facility] today. CSW notified patient of discharge today. CSW also notified Inetta Fermo at [facility] of discharge today. Inetta Fermo states that they have insurance authorization and patient can come today. Patient will be transported by EMS. RN to call report and call for transport.   Ruthe Mannan MSW, [address] [phone]

## 2018-08-16 NOTE — Progress Notes (Signed)
Physical Therapy Treatment Patient Details Name: Cory Harvey MRN: [id number] DOB: Nov 29, 1961 Today's Date: 08/16/2018    History of Present Illness Pt is a 57 y.o. male with a known history of cocaine use, CVA p/w acute weakness (generalized + L-sided) x1d.  He stated that he was lying on the floor unable to get up. He does not remember how he got there. He denies falling. He states that his friend visited him, and found him down on the ground. His friend called EMS. Pt stated that he felt weak all over, but he was especially weak on his L side. He has no complaints apart from weakness. He had stuttering speech, but stated this is old. He exhibits L-sided weakness, as well as some minimal L-sided neglect on exam. MRI brain performed in ED (+) "Large region of acute/early subacute infarction within the right posterior MCA distribution. No associated hemorrhage identified, however, the susceptibility sequences are motion degraded and petechial hemorrhage may not be visible. Minimal associated local mass effect."  Assessment includes: CVA and hypokalemia.    PT Comments    Pt presents with deficits in strength, transfers, mobility, gait, balance, and activity tolerance.  Pt required only minimal assistance during sup to/from sit mostly for optimal final positioning.  Pt required CGA during transfers but once in standing required physical assistance to prevent posterior LOB.  Pt had significant difficulty sequencing during attempt at amb with a RW and needed mod A for stability and to guide the RW.  With +2 HHA pt's posterior instability increased with constant assist required to prevent LOB.  Pt required extensive verbal and tactile cues for step sequencing and remains at an extremely high risk for falls.  Pt will benefit from PT services in a SNF setting upon discharge to safely address above deficits for decreased caregiver assistance and eventual return to PLOF.     Follow Up Recommendations  SNF     Equipment Recommendations  Other (comment)(TBD at next venue of care)    Recommendations for Other Services       Precautions / Restrictions Precautions Precautions: Fall Restrictions Weight Bearing Restrictions: No    Mobility  Bed Mobility Overal bed mobility: Needs Assistance Bed Mobility: Supine to Sit;Sit to Supine     Supine to sit: Min assist Sit to supine: Min assist   General bed mobility comments: Minimal assistance for optimal positioning during sup to/from sit  Transfers Overall transfer level: Needs assistance Equipment used: 2 person hand held assist Transfers: Sit to/from Stand Sit to Stand: Min guard         General transfer comment: good control to stand but once up requires assist +2 for balance/safety with posterior instability   Ambulation/Gait Ambulation/Gait assistance: +2 physical assistance;Mod assist Gait Distance (Feet): 4 Feet Assistive device: Rolling walker (2 wheeled);2 person hand held assist Gait Pattern/deviations: Step-to pattern;Shuffle;Ataxic     General Gait Details: Pt had significant difficulty sequencing during attempt at amb with a RW and needed mod A for stability and to guide the RW.  With +2 HHA pt's posterior instability increased with constant assist required to prevent LOB.    Stairs             Wheelchair Mobility    Modified Rankin (Stroke Patients Only)       Balance Overall balance assessment: Needs assistance Sitting-balance support: Single extremity supported;Feet supported Sitting balance-Leahy Scale: Fair     Standing balance support: Bilateral upper extremity supported;During functional activity Standing balance-Leahy Scale:  Poor Standing balance comment: Significant posterior instability in standing during functional acitivty                            Cognition Arousal/Alertness: Awake/alert Behavior During Therapy: WFL for tasks assessed/performed Overall Cognitive  Status: No family/caregiver present to determine baseline cognitive functioning                                 General Comments: Pt able to follow commands with mod-max cues to attend to L side.       Exercises Total Joint Exercises Ankle Circles/Pumps: AROM;Both;10 reps Short Arc Quad: AROM;AAROM;10 reps;15 reps;Both Heel Slides: AROM;Both;10 reps Straight Leg Raises: AROM;Both;10 reps Long Arc Quad: AROM;Both;10 reps;15 reps Knee Flexion: AROM;Both;10 reps;15 reps Marching in Standing: AROM;Both;Seated;10 reps    General Comments        Pertinent Vitals/Pain Pain Assessment: No/denies pain    Home Living                      Prior Function            PT Goals (current goals can now be found in the care plan section)      Frequency    7X/week      PT Plan Current plan remains appropriate    Co-evaluation              AM-PAC PT "6 Clicks" Daily Activity  Outcome Measure                   End of Session Equipment Utilized During Treatment: Gait belt Activity Tolerance: Patient tolerated treatment well Patient left: with bed alarm set;with call bell/phone within reach;in bed Nurse Communication: Mobility status PT Visit Diagnosis: Unsteadiness on feet (R26.81);Muscle weakness (generalized) (M62.81);Difficulty in walking, not elsewhere classified (R26.2);Hemiplegia and hemiparesis Hemiplegia - Right/Left: Left Hemiplegia - dominant/non-dominant: Dominant Hemiplegia - caused by: Unspecified     Time: 1245-8099 PT Time Calculation (min) (ACUTE ONLY): 24 min  Charges:  $Gait Training: 8-22 mins $Therapeutic Exercise: 8-22 mins                     D. Scott Ruqaya Strauss PT, DPT 08/16/18, 11:27 AM

## 2018-08-16 NOTE — Progress Notes (Signed)
Initial Nutrition Assessment  DOCUMENTATION CODES:   Not applicable  INTERVENTION:   -Ensure Enlive po BID, each supplement provides 350 kcal and 20 grams of protein -MVI with minerals daily  NUTRITION DIAGNOSIS:   Increased nutrient needs related to chronic illness(CVA, substance abuse) as evidenced by estimated needs.  GOAL:   Patient will meet greater than or equal to 90% of their needs  MONITOR:   PO intake, Supplement acceptance, Labs, Weight trends, Skin, I & O's  REASON FOR ASSESSMENT:   LOS    ASSESSMENT:   57 year old male with past medical history significant for substance abuse with cocaine and alcohol, prior history of strokes with no residual neurological deficits presents to hospital secondary to left-sided weakness and left-sided hemineglect  Pt admitted with rt MCA CVA.   Spoke with pt at bedside, who is in good spirits at time of visit. He has no complaints today, but is very interested in watching [place] program on television later on today.   Pt reports he has a great appetite ("I love food and love to eat"). PTA, pt reports he consumes at least 3 meals per day. Meals are prepared by either pt or his brother, who lives with him. Pt reports he loves rich foods such as fried chicken and collard greens, which he eats often. He also reports drinking a lot of soda PTA, but is committed to consumed more water. He has had a good appetite during hospitalization and has eaten 100% of meals served, which is consistent with documented meal percentage records in chart. He denies any difficulty chewing or swallowing foods.   Pt reports UBW is around 165#, but is unsure when he last weighed this. He suspects he last lost weight over an unspecified period of time, because "when I wear my nice clothes, they are a lot looser". Reviewed wt hx; noted pt has experienced a 15.2% wt loss over the past year.   Pt very anxious regarding discharge disposition. He reports anxiety  regarding stroke ("Am I gonna die?"). Provided emotional support to pt. He shares that he does not want to go to SNF, but knows that this is important to assist him in returning home. He has been progressing with therapy well per his report. Discussed importance of good nutritional intake to promote healing and progression with therapies. Pt is amenable to both food and supplements to assist him in meeting nutritional needs.   Labs reviewed.   NUTRITION - FOCUSED PHYSICAL EXAM:    Most Recent Value  Orbital Region  No depletion  Upper Arm Region  No depletion  Thoracic and Lumbar Region  No depletion  Buccal Region  No depletion  Temple Region  No depletion  Clavicle Bone Region  Mild depletion  Clavicle and Acromion Bone Region  No depletion  Scapular Bone Region  No depletion  Dorsal Hand  No depletion  Patellar Region  Mild depletion  Anterior Thigh Region  Mild depletion  Posterior Calf Region  Mild depletion  Edema (RD Assessment)  None  Hair  Reviewed  Eyes  Reviewed  Mouth  Reviewed  Skin  Reviewed  Nails  Reviewed       Diet Order:   Diet Order            Diet - low sodium heart healthy        Diet Heart Room service appropriate? Yes; Fluid consistency: Thin  Diet effective now  EDUCATION NEEDS:   Education needs have been addressed  Skin:  Skin Assessment: Reviewed RN Assessment  Last BM:  08/16/18  Height:   Ht Readings from Last 1 Encounters:  08/09/18 5\' 8"  (1.727 m)    Weight:   Wt Readings from Last 1 Encounters:  08/09/18 71.3 kg    Ideal Body Weight:  70 kg  BMI:  Body mass index is 23.89 kg/m.  Estimated Nutritional Needs:   Kcal:  1950-2150  Protein:  100-115 grams  Fluid:  1.9-2.1 L    Holleigh Crihfield A. Mayford Knife, RD, LDN, CDE Pager: [phone] After hours Pager: [phone]

## 2018-09-03 ENCOUNTER — Other Ambulatory Visit: Payer: Self-pay

## 2018-09-03 NOTE — Patient Outreach (Signed)
Telephone outreach to patient to obtain mRs was successfully completed. mRs= 5. 

## 2018-09-29 ENCOUNTER — Other Ambulatory Visit: Payer: Self-pay

## 2018-09-29 ENCOUNTER — Inpatient Hospital Stay
Admission: EM | Admit: 2018-09-29 | Discharge: 2018-10-01 | DRG: 065 | Disposition: A | Discharge status: Skilled Nursing Facility | Payer: Medicare Other | Attending: Internal Medicine | Admitting: Internal Medicine

## 2018-09-29 ENCOUNTER — Emergency Department: Payer: Medicare Other

## 2018-09-29 DIAGNOSIS — I63511 Cerebral infarction due to unspecified occlusion or stenosis of right middle cerebral artery: Secondary | ICD-10-CM | POA: Diagnosis not present

## 2018-09-29 DIAGNOSIS — G8194 Hemiplegia, unspecified affecting left nondominant side: Secondary | ICD-10-CM | POA: Diagnosis present

## 2018-09-29 DIAGNOSIS — T7401XA Adult neglect or abandonment, confirmed, initial encounter: Secondary | ICD-10-CM | POA: Diagnosis present

## 2018-09-29 DIAGNOSIS — Z79899 Other long term (current) drug therapy: Secondary | ICD-10-CM

## 2018-09-29 DIAGNOSIS — Z823 Family history of stroke: Secondary | ICD-10-CM

## 2018-09-29 DIAGNOSIS — F17210 Nicotine dependence, cigarettes, uncomplicated: Secondary | ICD-10-CM | POA: Diagnosis present

## 2018-09-29 DIAGNOSIS — I639 Cerebral infarction, unspecified: Secondary | ICD-10-CM

## 2018-09-29 DIAGNOSIS — Z7982 Long term (current) use of aspirin: Secondary | ICD-10-CM

## 2018-09-29 DIAGNOSIS — R29712 NIHSS score 12: Secondary | ICD-10-CM | POA: Diagnosis present

## 2018-09-29 DIAGNOSIS — G459 Transient cerebral ischemic attack, unspecified: Secondary | ICD-10-CM

## 2018-09-29 DIAGNOSIS — R32 Unspecified urinary incontinence: Secondary | ICD-10-CM | POA: Diagnosis present

## 2018-09-29 DIAGNOSIS — E785 Hyperlipidemia, unspecified: Secondary | ICD-10-CM | POA: Diagnosis present

## 2018-09-29 DIAGNOSIS — I69328 Other speech and language deficits following cerebral infarction: Secondary | ICD-10-CM

## 2018-09-29 DIAGNOSIS — I10 Essential (primary) hypertension: Secondary | ICD-10-CM | POA: Diagnosis present

## 2018-09-29 DIAGNOSIS — F1410 Cocaine abuse, uncomplicated: Secondary | ICD-10-CM | POA: Diagnosis present

## 2018-09-29 DIAGNOSIS — R531 Weakness: Secondary | ICD-10-CM | POA: Diagnosis not present

## 2018-09-29 LAB — CBC
HEMATOCRIT: 47.9 % (ref 39.0–52.0)
Hemoglobin: 16 g/dL (ref 13.0–17.0)
MCH: 28 pg (ref 26.0–34.0)
MCHC: 33.4 g/dL (ref 30.0–36.0)
MCV: 83.9 fL (ref 80.0–100.0)
NRBC: 0 % (ref 0.0–0.2)
PLATELETS: 493 10*3/uL — AB (ref 150–400)
RBC: 5.71 MIL/uL (ref 4.22–5.81)
RDW: 15.2 % (ref 11.5–15.5)
WBC: 16.4 10*3/uL — ABNORMAL HIGH (ref 4.0–10.5)

## 2018-09-29 LAB — COMPREHENSIVE METABOLIC PANEL
ALBUMIN: 3.7 g/dL (ref 3.5–5.0)
ALT: 19 U/L (ref 0–44)
AST: 19 U/L (ref 15–41)
Alkaline Phosphatase: 77 U/L (ref 38–126)
Anion gap: 10 (ref 5–15)
BILIRUBIN TOTAL: 0.7 mg/dL (ref 0.3–1.2)
BUN: 10 mg/dL (ref 6–20)
CO2: 25 mmol/L (ref 22–32)
Calcium: 9.1 mg/dL (ref 8.9–10.3)
Chloride: 105 mmol/L (ref 98–111)
Creatinine, Ser: 1.07 mg/dL (ref 0.61–1.24)
GFR calc Af Amer: >60 mL/min (ref >60)
GFR calc non Af Amer: >60 mL/min (ref >60)
Glucose, Bld: 81 mg/dL (ref 70–99)
POTASSIUM: 3.9 mmol/L (ref 3.5–5.1)
SODIUM: 140 mmol/L (ref 135–145)
TOTAL PROTEIN: 6.9 g/dL (ref 6.5–8.1)

## 2018-09-29 LAB — CK: Total CK: 180 U/L (ref 49–397)

## 2018-09-29 LAB — TROPONIN I: Troponin I: <0.03 ng/mL (ref <0.03)

## 2018-09-29 MED ORDER — TICAGRELOR 90 MG PO TABS
90.0000 mg | ORAL_TABLET | Freq: Two times a day (BID) | ORAL | Status: DC
  Administered 2018-09-29 – 2018-10-01 (×4): 90 mg via ORAL
  Filled 2018-09-29 (×5): qty 1

## 2018-09-29 MED ORDER — SODIUM CHLORIDE 0.9 % IV SOLN
1000.0000 mL | Freq: Once | INTRAVENOUS | Status: AC
  Administered 2018-09-29: 1000 mL via INTRAVENOUS

## 2018-09-29 MED ORDER — ASPIRIN 300 MG RE SUPP: 300.0000 mg | Freq: Every day | RECTAL | Status: DC

## 2018-09-29 MED ORDER — ENOXAPARIN SODIUM 40 MG/0.4ML ~~LOC~~ SOLN
40.0000 mg | SUBCUTANEOUS | Status: DC
  Administered 2018-09-29 – 2018-09-30 (×2): 40 mg via SUBCUTANEOUS
  Filled 2018-09-29 (×2): qty 0.4

## 2018-09-29 MED ORDER — BISACODYL 5 MG PO TBEC: 5.0000 mg | DELAYED_RELEASE_TABLET | Freq: Every day | ORAL | Status: DC | PRN

## 2018-09-29 MED ORDER — ACETAMINOPHEN 325 MG PO TABS: 650.0000 mg | ORAL_TABLET | ORAL | Status: DC | PRN

## 2018-09-29 MED ORDER — ACETAMINOPHEN 160 MG/5ML PO SOLN
650.0000 mg | ORAL | Status: DC | PRN
  Filled 2018-09-29: qty 20.3

## 2018-09-29 MED ORDER — SODIUM CHLORIDE 0.9 % IV SOLN
INTRAVENOUS | Status: AC
  Administered 2018-09-29 – 2018-09-30 (×2): via INTRAVENOUS

## 2018-09-29 MED ORDER — STROKE: EARLY STAGES OF RECOVERY BOOK
Freq: Once | Status: AC
  Administered 2018-09-29: 22:00:00

## 2018-09-29 MED ORDER — ASPIRIN EC 81 MG PO TBEC
81.0000 mg | DELAYED_RELEASE_TABLET | Freq: Every day | ORAL | Status: DC
  Administered 2018-09-30 – 2018-10-01 (×2): 81 mg via ORAL
  Filled 2018-09-29 (×2): qty 1

## 2018-09-29 MED ORDER — DIVALPROEX SODIUM 125 MG PO CSDR
125.0000 mg | DELAYED_RELEASE_CAPSULE | Freq: Two times a day (BID) | ORAL | Status: DC
  Administered 2018-09-29 – 2018-10-01 (×4): 125 mg via ORAL
  Filled 2018-09-29 (×5): qty 1

## 2018-09-29 MED ORDER — ATORVASTATIN CALCIUM 20 MG PO TABS
40.0000 mg | ORAL_TABLET | Freq: Every day | ORAL | Status: DC
  Administered 2018-09-29 – 2018-09-30 (×2): 40 mg via ORAL
  Filled 2018-09-29 (×2): qty 2

## 2018-09-29 MED ORDER — ASPIRIN 325 MG PO TABS
325.0000 mg | ORAL_TABLET | Freq: Every day | ORAL | Status: DC
  Administered 2018-09-29: 22:00:00 325 mg via ORAL
  Filled 2018-09-29: qty 1

## 2018-09-29 MED ORDER — ACETAMINOPHEN 650 MG RE SUPP: 650.0000 mg | RECTAL | Status: DC | PRN

## 2018-09-29 MED ORDER — FLUOXETINE HCL 10 MG PO CAPS
10.0000 mg | ORAL_CAPSULE | Freq: Every day | ORAL | Status: DC
  Administered 2018-09-30 – 2018-10-01 (×2): 10 mg via ORAL
  Filled 2018-09-29 (×2): qty 1

## 2018-09-29 MED ORDER — SENNOSIDES-DOCUSATE SODIUM 8.6-50 MG PO TABS: 1.0000 | ORAL_TABLET | Freq: Every evening | ORAL | Status: DC | PRN
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
## 2018-09-29 NOTE — ED Notes (Signed)
Genelle Bal, EDT in to assist pt with eating his dinner.

## 2018-09-29 NOTE — ED Notes (Signed)
Pt cleaned of incontinence by [place], Kadijah, and myself. New brief and gown placed on patient.

## 2018-09-29 NOTE — ED Notes (Signed)
Pt turned, dried and repositioned after he spilt his juice.

## 2018-09-29 NOTE — ED Provider Notes (Signed)
[facility] Emergency Department Provider Note   ____________________________________________    I have reviewed the triage vital signs and the nursing notes.   HISTORY  Chief Complaint Fall   History limited secondary to multiple strokes  HPI Cory Harvey is a 57 y.o. male who reports that he fell off of the couch onto the floor and had been lying there for 5 hours, EMS reports patient was found by his niece.  EMS reports that patient denies injury but they could not leave him there due to the condition that he was in.  Apparently he is cared for by his brother.  Patient reports he was lying on the floor for about 5 hours.  He denies chest pain abdominal pain neck pain.  Has chronic left-sided weakness from right MCA stroke.  Review of medical records demonstrates that he was discharged from the hospital in September at [facility].   Past Medical History:  Diagnosis Date  . Depression   . History of CVA (cerebrovascular accident)   . Hypertension   . Stroke (HCC)    x 4  . Tobacco abuse     Patient Active Problem List   Diagnosis Date Noted  . TIA (transient ischemic attack) 09/29/2018  . Arterial ischemic stroke, MCA (middle cerebral artery), right, acute (HCC) 08/09/2018  . Hyperlipidemia 05/29/2018  . Cocaine abuse (HCC) 05/29/2018  . Groin hematoma, right 05/29/2018  . Acute ischemic right MCA stroke [facility]) s/p neurointervention w/ R ICA stent placed 05/25/2018  . Middle cerebral artery stenosis, right 05/25/2018  . Cerebrovascular accident (CVA) involving right cerebral hemisphere (HCC) 03/29/2018  . CVA (cerebral vascular accident) (HCC) 03/26/2018  . Acute CVA (cerebrovascular accident) (HCC) 09/02/2017  . Gait disturbance 09/02/2017  . History of CVA (cerebrovascular accident) 04/18/2017  . Tobacco use 04/18/2017  . Depression   . Hypertension   . Stroke [facility])     Past Surgical History:    Procedure Laterality Date  . IR CT HEAD LTD  05/25/2018  . IR INTRA CRAN STENT  05/25/2018  . kidney surgery Left 1983  . RADIOLOGY WITH ANESTHESIA N/A 05/25/2018   Procedure: RADIOLOGY WITH ANESTHESIA;  Surgeon: Julieanne Cotton, MD;  Location: [facility] OR;  Service: Radiology;  Laterality: N/A;  . TEE WITHOUT CARDIOVERSION N/A 03/27/2018   Procedure: TRANSESOPHAGEAL ECHOCARDIOGRAM (TEE);  Surgeon: Dalia Heading, MD;  Location: [facility] ORS;  Service: Cardiovascular;  Laterality: N/A;    Prior to Admission medications   Medication Sig Start Date End Date Taking? Authorizing Provider  aspirin 81 MG chewable tablet Chew 1 tablet (81 mg total) by mouth daily. 05/30/18   Layne Benton, NP  atorvastatin (LIPITOR) 40 MG tablet Take 1 tablet (40 mg total) by mouth at bedtime. 08/16/18   Altamese Dilling, MD  bisacodyl (DULCOLAX) 5 MG EC tablet Take 1 tablet (5 mg total) by mouth daily as needed for moderate constipation. 08/16/18   Altamese Dilling, MD  losartan (COZAAR) 25 MG tablet Take 1 tablet (25 mg total) by mouth daily. 04/04/18   Love, Evlyn Kanner, PA-C  senna-docusate (SENOKOT-S) 8.6-50 MG tablet Take 1 tablet by mouth at bedtime as needed for mild constipation. 08/16/18   Altamese Dilling, MD  ticagrelor (BRILINTA) 90 MG TABS tablet Take 1 tablet (90 mg total) by mouth 2 (two) times daily. 05/29/18   Layne Benton, NP     Allergies Patient has no known allergies.  Family History  Problem Relation Age  of Onset  . Diabetes Mother   . Heart disease Mother   . Hyperlipidemia Mother   . Hypertension Mother   . Stroke Mother   . Diabetes Father   . Hypertension Father   . Cancer Neg Hx     Social History Social History   Tobacco Use  . Smoking status: Current Every Day Smoker    Packs/day: 0.25    Types: Cigarettes  . Smokeless tobacco: Never Used  Substance Use Topics  . Alcohol use: Not Currently    Comment: occasional   . Drug use: No    Review of Systems limited by  CVA  Constitutional: No dizziness Eyes: No visual changes.  ENT: No neck pain Cardiovascular: Denies chest pain. Respiratory: No cough Gastrointestinal: No abdominal pain.  Genitourinary: Foul-smelling urine Musculoskeletal: Negative for extremity injury Skin: Negative for rash. Neurological: Negative for headaches    ____________________________________________   PHYSICAL EXAM:  VITAL SIGNS: ED Triage Vitals  Enc Vitals Group     BP --      Pulse --      Resp --      Temp --      Temp src --      SpO2 --      Weight 09/29/18 1511 77.1 kg (170 lb)     Height 09/29/18 1511 1.753 m (5\' 9" )     Head Circumference --      Peak Flow --      Pain Score 09/29/18 1510 0     Pain Loc --      Pain Edu? --      Excl. in GC? --     Constitutional: Alert and oriented, significant stuttering speech which apparently is chronic per notes Eyes: Conjunctivae are normal.  Head: Atraumatic. Nose: No congestion/rhinnorhea. Mouth/Throat: Mucous membranes are dry Neck:  Painless ROM, no vertebral tenderness to palpation Cardiovascular: Normal rate, regular rhythm. Grossly normal heart sounds.  Good peripheral circulation. Respiratory: Normal respiratory effort.  No retractions. Lungs CTAB. Gastrointestinal: Soft and nontender. No distention. .  Musculoskeletal:  Warm and well perfused Neurologic: Difficult to understand speech, weakness left upper extremity and left lower externally, chronic Skin:  Skin is warm, dry and intact. No rash noted.   ____________________________________________   LABS (all labs ordered are listed, but only abnormal results are displayed)  Labs Reviewed  CBC - Abnormal; Notable for the following components:      Result Value   WBC 16.4 (*)    Platelets 493 (*)    All other components within normal limits  CK  COMPREHENSIVE METABOLIC PANEL  TROPONIN I  URINALYSIS, COMPLETE (UACMP) WITH MICROSCOPIC    ____________________________________________  EKG  None ____________________________________________  RADIOLOGY  Chest x-ray unremarkable ____________________________________________   PROCEDURES  Procedure(s) performed: No  Procedures   Critical Care performed: No ____________________________________________   INITIAL IMPRESSION / ASSESSMENT AND PLAN / ED COURSE  Pertinent labs & imaging results that were available during my care of the patient were reviewed by me and considered in my medical decision making (see chart for details).  Patient with a history of multiple CVAs with chronic left-sided weakness, apparently is supposed to be cared for by his brother found on the floor today, apparently had been there for approximately 5 hours.  No injuries.  We will check CK, labs, chest x-ray, EKG consult social work  ----------------------------------------- 6:03 PM on 09/29/2018 -----------------------------------------  Family is here and they report his speech seems more slurred than usual,  will send for CT head.  Onset would be greater than 5 hours prior to arrival. lab work is overall reassuring elevated white blood cell count, still pending urinalysis, chest x-ray unremarkable.  Additionally family reports they are unable to care for him safely at home, patient is unable to feed himself, he is incontinent of urine, unable to transfer without 2 person lift.  Will consult social work  CT scan overall unremarkable, still concern for new CVA, will admit to the hospital service for further evaluation    ____________________________________________   FINAL CLINICAL IMPRESSION(S) / ED DIAGNOSES  Final diagnoses:  Cerebrovascular accident (CVA), unspecified mechanism (HCC)        Note:  This document was prepared using Dragon voice recognition software and may include unintentional dictation errors.    Jene Every, MD 09/29/18 [phone]
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
## 2018-09-30 ENCOUNTER — Observation Stay
Admit: 2018-09-30 | Discharge: 2018-09-30 | Disposition: A | Discharge status: Home / Self Care | Payer: Medicare Other | Attending: Nurse Practitioner | Admitting: Nurse Practitioner

## 2018-09-30 ENCOUNTER — Other Ambulatory Visit: Payer: Self-pay

## 2018-09-30 ENCOUNTER — Observation Stay: Payer: Medicare Other

## 2018-09-30 DIAGNOSIS — I639 Cerebral infarction, unspecified: Secondary | ICD-10-CM | POA: Diagnosis not present

## 2018-09-30 LAB — LIPID PANEL
CHOL/HDL RATIO: 3 ratio
CHOLESTEROL: 115 mg/dL (ref 0–200)
HDL: 38 mg/dL — ABNORMAL LOW (ref >40)
LDL Cholesterol: 69 mg/dL (ref 0–99)
TRIGLYCERIDES: 42 mg/dL (ref <150)
VLDL: 8 mg/dL (ref 0–40)

## 2018-09-30 LAB — URINE DRUG SCREEN, QUALITATIVE (ARMC ONLY)
AMPHETAMINES, UR SCREEN: NOT DETECTED
Barbiturates, Ur Screen: NOT DETECTED
Benzodiazepine, Ur Scrn: NOT DETECTED
Cannabinoid 50 Ng, Ur ~~LOC~~: NOT DETECTED
Cocaine Metabolite,Ur ~~LOC~~: NOT DETECTED
MDMA (ECSTASY) UR SCREEN: NOT DETECTED
Methadone Scn, Ur: NOT DETECTED
OPIATE, UR SCREEN: NOT DETECTED
PHENCYCLIDINE (PCP) UR S: NOT DETECTED
Tricyclic, Ur Screen: NOT DETECTED

## 2018-09-30 LAB — URINALYSIS, COMPLETE (UACMP) WITH MICROSCOPIC
Bilirubin Urine: NEGATIVE
Glucose, UA: NEGATIVE mg/dL
Hgb urine dipstick: NEGATIVE
Ketones, ur: NEGATIVE mg/dL
Leukocytes, UA: NEGATIVE
Nitrite: NEGATIVE
Protein, ur: NEGATIVE mg/dL
SPECIFIC GRAVITY, URINE: 1.03 (ref 1.005–1.030)
SQUAMOUS EPITHELIAL / LPF: NONE SEEN (ref 0–5)
pH: 6 (ref 5.0–8.0)

## 2018-09-30 LAB — CBC WITH DIFFERENTIAL/PLATELET
Abs Immature Granulocytes: 0.02 10*3/uL (ref 0.00–0.07)
BASOS ABS: 0 10*3/uL (ref 0.0–0.1)
BASOS PCT: 0 %
EOS PCT: 2 %
Eosinophils Absolute: 0.2 10*3/uL (ref 0.0–0.5)
HCT: 40.8 % (ref 39.0–52.0)
HEMOGLOBIN: 13.4 g/dL (ref 13.0–17.0)
Immature Granulocytes: 0 %
LYMPHS PCT: 16 %
Lymphs Abs: 1.3 10*3/uL (ref 0.7–4.0)
MCH: 28 pg (ref 26.0–34.0)
MCHC: 32.8 g/dL (ref 30.0–36.0)
MCV: 85.4 fL (ref 80.0–100.0)
MONO ABS: 0.8 10*3/uL (ref 0.1–1.0)
Monocytes Relative: 10 %
NEUTROS ABS: 5.5 10*3/uL (ref 1.7–7.7)
Neutrophils Relative %: 72 %
PLATELETS: 451 10*3/uL — AB (ref 150–400)
RBC: 4.78 MIL/uL (ref 4.22–5.81)
RDW: 15.2 % (ref 11.5–15.5)
WBC: 7.8 10*3/uL (ref 4.0–10.5)
nRBC: 0 % (ref 0.0–0.2)

## 2018-09-30 LAB — ECHOCARDIOGRAM COMPLETE
Height: 67 in
Weight: 2688 oz

## 2018-09-30 MED ORDER — IOPAMIDOL (ISOVUE-370) INJECTION 76%
75.0000 mL | Freq: Once | INTRAVENOUS | Status: AC | PRN
  Administered 2018-09-30: 10:00:00 75 mL via INTRAVENOUS

## 2018-09-30 MED ORDER — INFLUENZA VAC SPLIT QUAD 0.5 ML IM SUSY: 0.5000 mL | PREFILLED_SYRINGE | INTRAMUSCULAR | Status: DC

## 2018-09-30 NOTE — NC FL2 (Signed)
[place] MEDICAID FL2 LEVEL OF CARE SCREENING TOOL     IDENTIFICATION  Patient Name: Cory Harvey Birthdate: 02-01-1961 Sex: male Admission Date (Current Location): 09/29/2018  [place] and [place] Number:  Chiropodist and Address:  [facility], [address], [place], [place] [address]      Provider Number: [id number]  Attending Physician Name and Address:  Auburn Bilberry, MD  Relative Name and Phone Number:       Current Level of Care: Hospital Recommended Level of Care: Skilled Nursing Facility Prior Approval Number:    Date Approved/Denied:   PASRR Number: pending  Discharge Plan: SNF    Current Diagnoses: Patient Active Problem List   Diagnosis Date Noted  . TIA (transient ischemic attack) 09/29/2018  . Arterial ischemic stroke, MCA (middle cerebral artery), right, acute (HCC) 08/09/2018  . Hyperlipidemia 05/29/2018  . Cocaine abuse (HCC) 05/29/2018  . Groin hematoma, right 05/29/2018  . Acute ischemic right MCA stroke [facility]) s/p neurointervention w/ R ICA stent placed 05/25/2018  . Middle cerebral artery stenosis, right 05/25/2018  . Cerebrovascular accident (CVA) involving right cerebral hemisphere (HCC) 03/29/2018  . CVA (cerebral vascular accident) (HCC) 03/26/2018  . Acute CVA (cerebrovascular accident) (HCC) 09/02/2017  . Gait disturbance 09/02/2017  . History of CVA (cerebrovascular accident) 04/18/2017  . Tobacco use 04/18/2017  . Depression   . Hypertension   . Stroke (HCC)     Orientation RESPIRATION BLADDER Height & Weight     Self, Place  Normal Incontinent Weight: 168 lb (76.2 kg) Height:  5\' 7"  (170.2 cm)  BEHAVIORAL SYMPTOMS/MOOD NEUROLOGICAL BOWEL NUTRITION STATUS  (none) (has had 4 strokes ) Continent Diet(Dysphagia 2 )  AMBULATORY STATUS COMMUNICATION OF NEEDS Skin   Extensive Assist Verbally Normal                       Personal Care Assistance Level of Assistance  Bathing, Feeding,  Dressing Bathing Assistance: Limited assistance Feeding assistance: Independent Dressing Assistance: Limited assistance     Functional Limitations Info  Sight, Hearing, Speech Sight Info: Adequate Hearing Info: Adequate Speech Info: Adequate    SPECIAL CARE FACTORS FREQUENCY  PT (By licensed PT), OT (By licensed OT)     PT Frequency: 5 OT Frequency: 5            Contractures Contractures Info: Not present    Additional Factors Info  Code Status, Allergies Code Status Info: Full Code  Allergies Info: NKA           Current Medications (09/30/2018):  This is the current hospital active medication list Current Facility-Administered Medications  Medication Dose Route Frequency Provider Last Rate Last Dose  . 0.9 %  sodium chloride infusion   Intravenous Continuous Gouru, Aruna, MD 75 mL/hr at 09/30/18 1114    . acetaminophen (TYLENOL) tablet 650 mg  650 mg Oral Q4H PRN Gouru, Aruna, MD       Or  . acetaminophen (TYLENOL) solution 650 mg  650 mg Per Tube Q4H PRN Gouru, Aruna, MD       Or  . acetaminophen (TYLENOL) suppository 650 mg  650 mg Rectal Q4H PRN Gouru, Aruna, MD      . aspirin EC tablet 81 mg  81 mg Oral Daily Gouru, Aruna, MD   81 mg at 09/30/18 1110  . atorvastatin (LIPITOR) tablet 40 mg  40 mg Oral QHS Gouru, Deanna Artis, MD   40 mg at 09/29/18 2304  . bisacodyl (  DULCOLAX) EC tablet 5 mg  5 mg Oral Daily PRN Gouru, Aruna, MD      . divalproex (DEPAKOTE SPRINKLE) capsule 125 mg  125 mg Oral BID Gouru, Aruna, MD   125 mg at 09/30/18 1110  . enoxaparin (LOVENOX) injection 40 mg  40 mg Subcutaneous Q24H Gouru, Aruna, MD   40 mg at 09/29/18 2139  . FLUoxetine (PROZAC) capsule 10 mg  10 mg Oral Daily Gouru, Aruna, MD   10 mg at 09/30/18 1110  . senna-docusate (Senokot-S) tablet 1 tablet  1 tablet Oral QHS PRN Gouru, Aruna, MD      . ticagrelor (BRILINTA) tablet 90 mg  90 mg Oral BID Gouru, Aruna, MD   90 mg at 09/30/18 1110     Discharge Medications: Please see  discharge summary for a list of discharge medications.  Relevant Imaging Results:  Relevant Lab Results:   Additional Information SSN: [id number]  Ruthe Mannan, [place]
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
## 2018-09-30 NOTE — Clinical Social Work Note (Signed)
Clinical Social Work Assessment  Patient Details  Name: Cory Harvey MRN: [id number] Date of Birth: [birth year]  Date of referral:  09/30/18               Reason for consult:  Facility Placement                Permission sought to share information with:  Case Manager, Customer service manager, Family Supports Permission granted to share information::  Yes, Verbal Permission Granted  Name::      SNF  Agency::   [facility]   Relationship::     Contact Information:     Housing/Transportation Living arrangements for the past 2 months:  Single Family Home Source of Information:  Patient Patient Interpreter Needed:  None Criminal Activity/Legal Involvement Pertinent to Current Situation/Hospitalization:  No - Comment as needed Significant Relationships:  Parents, Siblings Lives with:  Siblings Do you feel safe going back to the place where you live?  Yes Need for family participation in patient care:  Yes (Comment)  Care giving concerns:  Patient lives with brother in [place] Worker assessment / plan:  CSW consulted for SNF placement. CSW met with patient to discuss discharge plan. CSW introduced self and explained role. Patient is alert and oriented to self and place. Patient's speech is slurred and hard to understand from multiple strokes. Patient states that he lives with his brother who works "all the time" and is unable to really care for him as needed. CSW asked patient who does care for him and patient states that his mom cares for him. CSW explained PT recommendation of SNF. Patient is in agreement with SNF and requests to stay in [facility]. CSW will begin bed search and give bed offers once received. CSW will follow for discharge planning.   Employment status:  Disabled (Comment on whether or not currently receiving Disability) Insurance information:  Managed Medicare PT Recommendations:  [place] / Referral to  community resources:     Patient/Family's Response to care:  Patient thanked CSW for assistance   Patient/Family's Understanding of and Emotional Response to Diagnosis, Current Treatment, and Prognosis:  Patient is in agreement with discharge plan   Emotional Assessment Appearance:  Appears stated age Attitude/Demeanor/Rapport:    Affect (typically observed):  Accepting, Pleasant, Restless Orientation:  Oriented to Self, Oriented to Place Alcohol / Substance use:  Not Applicable Psych involvement (Current and /or in the community):  No (Comment)  Discharge Needs  Concerns to be addressed:  Discharge Planning Concerns Readmission within the last 30 days:  No Current discharge risk:  None Barriers to Discharge:  Continued Medical Work up   [employer], [place] 09/30/2018, 5:07 PM

## 2018-09-30 NOTE — Progress Notes (Signed)
Pt has left sided weakness and slurred speech from previous strokes. Will continue to monitor for change in condition but neuro/vital checks were stable from baseline.

## 2018-09-30 NOTE — Evaluation (Signed)
Physical Therapy Evaluation Patient Details Name: Cory Harvey MRN: [id number] DOB: [birth year] Today's Date: 09/30/2018   History of Present Illness  57 y.o. 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  Patient fell off of the couch and he was found on the floor and had been lying there for approximately 5 hours as reported by the EMS.  Patient was found on the floor by his niece.  Patient was recently discharged [facility] the [facility] September but now he is at home under his brother's care.  He feels like his neglected by his brother.  Initial CT head is negative.  Clinical Impression  Upon evaluation, patient alert and oriented to basic information; follows simple commands, but often requires redirection/reorientation to task at hand. Easily distractible; poor insight/awareness.  Significant apraxia noted throughout all extremities with marked difficulty initiating and coordinating purposeful movement on command.  L UE > LE hemiparesis evident with some degree of sensory deficit appreciated (detects light touch in isolation; question higher level sensory awareness and attention to L hemi-body).  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  Currently requiring mod/max assist for bed mobility; min/mod assist for unsupported sitting balance; max assist +2 with bilat HHA for sit/stand and static standing balance.  Significant pushing behaviors (towards L) noted, requiring constant manual cuing/facilitation from therapist to correct. Unsafe/unable to attempt stepping/gait training at this time.  Will continue efforts as patient appropriate to progress. Would benefit from skilled PT to address above deficits and  promote optimal return to PLOF; recommend transition to STR upon discharge from acute hospitalization.     Follow Up Recommendations SNF    Equipment Recommendations       Recommendations for Other Services       Precautions / Restrictions Precautions Precautions: Fall Restrictions Weight Bearing Restrictions: No      Mobility  Bed Mobility Overal bed mobility: Needs Assistance Bed Mobility: Supine to Sit;Sit to Supine     Supine to sit: Max assist;Mod assist Sit to supine: Max assist;+2 for physical assistance   General bed mobility comments: assist for purposeful, sequenced movement of all extremities (due to apraxia)  Transfers Overall transfer level: Needs assistance Equipment used: 2 person hand held assist Transfers: Sit to/from Stand Sit to Stand: Max assist;+2 physical assistance;Mod assist         General transfer comment: extensive assist for midline in A/P and M/L plane; heavy pushing behaviors to L and posterior.  Constantly attempting to step/move R LE due to impaired midline.  Ambulation/Gait             General Gait Details: unsafe/unable  Stairs            Wheelchair Mobility    Modified Rankin (Stroke Patients Only)       Balance Overall balance assessment: Needs assistance Sitting-balance support: No upper extremity supported;Feet supported Sitting balance-Leahy Scale: Poor Sitting balance - Comments: signficant pushing behaviors towards to L posterior/lateral direction   Standing balance support: Single extremity supported Standing balance-Leahy Scale: Zero Standing balance comment: significant pushing to L posteiror/lateral direction                             Pertinent Vitals/Pain Pain Assessment: Faces Faces  Pain Scale: Hurts little more Pain Location: L shoulder Pain Descriptors / Indicators: Aching;Grimacing;Guarding Pain Intervention(s): Monitored during session;Limited activity within patient's  tolerance;Repositioned    Home Living Family/patient expects to be discharged to:: Private residence Living Arrangements: Other relatives(brother; works outside of the home per patient report) Available Help at Discharge: Available PRN/intermittently;Family Type of Home: House Home Access: Stairs to enter Entrance Stairs-Rails: Can reach both;Left;Right Entrance Stairs-Number of Steps: 6 Home Layout: One level Home Equipment: Walker - 2 wheels;Cane - single point Additional Comments: Patient questionable historian; will verify with family as available    Prior Function Level of Independence: Needs assistance(no family present, pt reporting he was ambulating independently without an AD and was driving independently just a day prior to this admission. )         Comments: Per patient report, indep ambulation without AD, Ind with ADLs, "I have never fallen"; brother checks in on pt intermittently.  Patient questionable historian; will verify with family as available     Hand Dominance   Dominant Hand: Left    Extremity/Trunk Assessment   Upper Extremity Assessment Upper Extremity Assessment: (R UE grossly WFL; L UE grossly 2-/5 at shoulder and elbow, 0/5 at wrist and hand.  Significant apraxia noted.  Detects light touch in isolation, though question full sensory awareness.) RUE Deficits / Details: grossly 4-/5 LUE Deficits / Details: impaired sensation, proprioception, grossly 2-/5 LUE Sensation: decreased light touch;decreased proprioception LUE Coordination: decreased fine motor;decreased gross motor    Lower Extremity Assessment Lower Extremity Assessment: (R LE grossly WFL; L LE grossly 2/5 at hip and knee (ext > flex), 0/5 L ankle.  + Babinski L LE, positive multi-beat, sustained clonus.  Detects light touch in isolation; question full sensory awareness.)    Cervical / Trunk Assessment Cervical / Trunk Assessment: (elongation of L lateral trunk with associated upward  rotation of scapula, downward tilt of L pelvis; L facial drooping evident)  Communication   Communication: (significant dysarthria; difficult to understand at times)  Cognition Arousal/Alertness: Awake/alert Behavior During Therapy: Flat affect Overall Cognitive Status: No family/caregiver present to determine baseline cognitive functioning                                 General Comments: Pt alert and oriented to self and situation. Pt able to follow simple commands with increased verbal cues. Impaired safety awareness and awareness of deficits. Easily distracted requiring cues to redirect. Impaired attention to L side.      General Comments General comments (skin integrity, edema, etc.): during toileting task, nurse tech noted pink foam dressing on sacrum may need to be changed. Nurse tech verbalized plan to inform RN.    Exercises Other Exercises Other Exercises: Unsupported sitting, worked on R UE reaching (modified D2 extension pattern) to promote neutral trunk alignment/core activation, R ant/lateral weight shift.  Constant manual cuing to facilitate and correct posture upon return to neutral.  Absent awareness (and self-correction) of L lateral pushing without verbal/tactile cuing from therapist. 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  Significant difficulty visually tracking to L of midline despite extensive cuing and use of markers/tracers.  Signficant L inattention suspected.   Assessment/Plan    PT Assessment Patient needs continued PT services  PT Problem List Decreased strength;Decreased coordination;Decreased activity tolerance;Decreased range of motion;Decreased balance;Decreased mobility;Decreased cognition;Impaired sensation;Decreased knowledge of  use of DME;Decreased safety awareness;Decreased knowledge of precautions;Cardiopulmonary  status limiting activity;Impaired tone       PT Treatment Interventions DME instruction;Stair training;Functional mobility training;Therapeutic activities;Balance training;Neuromuscular re-education;Gait training;Therapeutic exercise;Patient/family education    PT Goals (Current goals can be found in the Care Plan section)  Acute Rehab PT Goals Patient Stated Goal: to return home PT Goal Formulation: With patient Time For Goal Achievement: 10/14/18 Potential to Achieve Goals: Fair Additional Goals Additional Goal #1: Assess and establish goals for gait as appropriate.    Frequency 7X/week   Barriers to discharge Decreased caregiver support      Co-evaluation               AM-PAC PT "6 Clicks" Daily Activity  Outcome Measure Difficulty turning over in bed (including adjusting bedclothes, sheets and blankets)?: Unable Difficulty moving from lying on back to sitting on the side of the bed? : Unable Difficulty sitting down on and standing up from a chair with arms (e.g., wheelchair, bedside commode, etc,.)?: Unable Help needed moving to and from a bed to chair (including a wheelchair)?: Total Help needed walking in hospital room?: Total Help needed climbing 3-5 steps with a railing? : Total 6 Click Score: 6    End of Session Equipment Utilized During Treatment: Gait belt Activity Tolerance: Patient tolerated treatment well Patient left: in bed;with call bell/phone within reach;with bed alarm set Nurse Communication: Mobility status PT Visit Diagnosis: Muscle weakness (generalized) (M62.81);Difficulty in walking, not elsewhere classified (R26.2);Hemiplegia and hemiparesis Hemiplegia - Right/Left: Left Hemiplegia - dominant/non-dominant: Dominant Hemiplegia - caused by: Cerebral infarction    Time: 1120-1145 PT Time Calculation (min) (ACUTE ONLY): 25 min   Charges:   PT Evaluation $PT Eval Moderate Complexity: 1 Mod PT Treatments $Neuromuscular Re-education: 8-22  mins        Makyra Corprew H. Manson Passey, PT, DPT, NCS 09/30/18, 1:30 PM [phone]

## 2018-09-30 NOTE — Care Management Obs Status (Signed)
MEDICARE OBSERVATION STATUS NOTIFICATION   Patient Details  Name: Cory Harvey MRN: [id number] Date of Birth: [birth year]  .  Medicare Observation Status Notification Given:  Yes . Explained to Mr. Rameses Ou, RN 09/30/2018, 8:42 AM
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
## 2018-09-30 NOTE — Care Management Note (Addendum)
Case Management Note  Patient Details  Name: Cory Harvey MRN: [id number] Date of Birth: 1961-09-01  Subjective/Objective:   Admitted to [facility] under observation status with the diagnosis of TIA> Lives with brother. . States his brother works from 3:00am-5:00pm. Mother is Waymon Budge [phone]). Sees Dr. Burnadette Pop as primary care provider. Prescriptions are filled at [facility] in [facility] in the past. [facility] 08/16/18, [facility] 05/29/18 and [facility] 03/2018.    Rolling walker and cane in the home. States he doesn't use them.   Self feed. States he dresses and bathes himself. States he fixes his meals when his brother is not at home. Larey Seat prior to this admission.             Action/Plan:  Occupation therapy evaluation completed. Recommending skilled nursing facility. Speech and physical therapy pending.   Expected Discharge Date:                  Expected Discharge Plan:     In-House Referral:   yes  Discharge planning Services   yes  Post Acute Care Choice:    Choice offered to:     DME Arranged:    DME Agency:     HH Arranged:    HH Agency:     Status of Service:     If discussed at [employer] of Stay Meetings, dates discussed:    Additional Comments:  Gwenette Greet, RN MSN CCM Care Management [phone] 09/30/2018, 8:47 AM

## 2018-09-30 NOTE — Evaluation (Addendum)
Clinical/Bedside Swallow Evaluation Patient Details  Name: Cory Harvey MRN: [id number] Date of Birth: 21-Apr-1961  Today's Date: 09/30/2018 Time: SLP Start Time (ACUTE ONLY): 1240 SLP Stop Time (ACUTE ONLY): 1350 SLP Time Calculation (min) (ACUTE ONLY): 70 min  Past Medical History:  Past Medical History:  Diagnosis Date  . Depression   . History of CVA (cerebrovascular accident)   . Hypertension   . Stroke (HCC)    x 4  . Tobacco abuse    Past Surgical History:  Past Surgical History:  Procedure Laterality Date  . IR CT HEAD LTD  05/25/2018  . IR INTRA CRAN STENT  05/25/2018  . kidney surgery Left 1983  . RADIOLOGY WITH ANESTHESIA N/A 05/25/2018   Procedure: RADIOLOGY WITH ANESTHESIA;  Surgeon: Julieanne Cotton, MD;  Location: [facility] OR;  Service: Radiology;  Laterality: N/A;  . TEE WITHOUT CARDIOVERSION N/A 03/27/2018   Procedure: TRANSESOPHAGEAL ECHOCARDIOGRAM (TEE);  Surgeon: Dalia Heading, MD;  Location: [facility] ORS;  Service: Cardiovascular;  Laterality: N/A;   HPI:      Assessment / Plan / Recommendation Clinical Impression  Clinical bedside swallowing evaluation was completed per NSG noting oral phase deficits and therapy staff noting moderate drooling orally on Left side. Pt has a h/o dysphagia and has been seen by ST services in Acute and CIR this year. Pt presents w/ declined Cognitive-Linguistic status and Dysarthria which is baseline for him. He required Moderate verbal/tactile cues for follow through w/ tasks including positioning upright in bed. He is Left hand dominant and unable to use the Left UE d/t weakness post previous CVA.  Pt consumed trials of thin liquids via Cup, pureed material, and solids. Pt does struggle to feed himself w/ the R hand lacking coordination. He exhibited Oral phase dysphagia c/b moderately increased time for mastication (which appeared uncoordinated as well during bolus management), oral holding, and delay in oral transit with solids.  Diffuse bolus residue noted; he utilized a liquid wash to completely clear oral cavity.  Oral residue/pocketing was not seen as he appeared to use lingual sweeping sufficient to clear oral cavity.  Patient was encouraged to pay attention to the possibility of oral residue and perform a lingual sweep. Of note, during the extended mastication time w/ solids, he exhibited coughing suspect d/t the increased juice/saliva from the foods - mixed consistency foods could be problematic for pt and should be lessened in his diet. He exhibited no gross oral phase deficits w/ liquids and purees.  During the pharyngeal phase of swallowing, overt coughing was noted x2 post trials of thin liquids via Cup. Unsure if pt took too large a swallow or if a delay in pharyngeal swallow initiation(d/t multiple old CVAs). No coughing was noted post puree trials. OM exam revealed generalized OM weakness but noted Left labial - lingual weakness more prominent.  Recommend a Dysphagia level 2 (MINCED foods) w/ thin liquids w/ strict Aspiration precautions at this time w/ monitoring of toleration of diet but in particular the thin liquids for need to downgrade to Nectar consistency for safer toleration of diet. Recommend pills in Puree; feeding assistance at all meals. Precautions were posted at the head of bed. ST will follow for therapeutic diet tolerance and pt/family education. NSG updated. SLP Visit Diagnosis: Dysphagia, oropharyngeal phase (R13.12)(baseline Cognitive-linguistic deficits and Dysarthria)    Aspiration Risk  Mild aspiration risk;Moderate aspiration risk    Diet Recommendation     Medication Administration: Whole meds with puree(for safer swallowing)    Other  Recommendations Recommended Consults: (dietician f/u) Oral Care Recommendations: Oral care BID;Staff/trained caregiver to provide oral care Other Recommendations: (n/a at this time)   Follow up Recommendations Skilled Nursing facility      Frequency and  Duration min 3x week  2 weeks       Prognosis Prognosis for Safe Diet Advancement: Fair Barriers to Reach Goals: Cognitive deficits;Language deficits;Time post onset;Behavior((impulsivity))      Swallow Study   General Date of Onset: 09/29/18 Type of Study: Bedside Swallow Evaluation Previous Swallow Assessment: April and June 2019; pt was admitted at [facility] in April 2019 Diet Prior to this Study: Regular;Thin liquids(at admission) Temperature Spikes Noted: No(wbc 7.8) Respiratory Status: Room air History of Recent Intubation: No Behavior/Cognition: Cooperative;Pleasant mood;Confused;Impulsive;Distractible;Requires cueing(Awake; multiple old CVAs) Oral Cavity Assessment: Within Functional Limits Oral Care Completed by SLP: Recent completion by staff Oral Cavity - Dentition: Missing dentition Vision: Functional for self-feeding Self-Feeding Abilities: Needs assist;Needs set up;Total assist((almost) d/t LUE and being Left handed) Patient Positioning: Upright in bed(needed full support) Baseline Vocal Quality: Normal(Dysarthric speech) Volitional Cough: Strong Volitional Swallow: Able to elicit    Oral/Motor/Sensory Function Overall Oral Motor/Sensory Function: Mild impairment Facial ROM: Reduced left;Suspected CN VII (facial) dysfunction Facial Symmetry: Abnormal symmetry left;Suspected CN VII (facial) dysfunction Facial Strength: Reduced left;Suspected CN VII (facial) dysfunction Lingual ROM: Within Functional Limits(grossly) Lingual Symmetry: Abnormal symmetry left(grossly wfl but slight deviation to L ) Lingual Strength: Within Functional Limits Velum: Within Functional Limits Mandible: Within Functional Limits   Ice Chips Ice chips: Within functional limits(chewed ice vigorously x1 trial) Presentation: Cup;Self Fed   Thin Liquid Thin Liquid: Impaired Presentation: Cup;Self Fed Oral Phase Impairments: Reduced labial seal(slight) Oral Phase Functional Implications: (slight  leakage x1) Pharyngeal  Phase Impairments: Cough - Delayed(x2) Other Comments: encouraged pt to slow down and take smaller sips for better oropharyngeal control during swallowing    Nectar Thick Nectar Thick Liquid: Not tested   Honey Thick Honey Thick Liquid: Not tested   Puree Puree: Within functional limits(grossly) Presentation: Spoon;Self Fed(assisted; 9-10 trials)   Solid     Solid: Impaired Presentation: Self Fed;Spoon(also picked up foods w/ his R hand; 7-8+ trials) Oral Phase Impairments: Reduced labial seal;Reduced lingual movement/coordination;Impaired mastication;Poor awareness of bolus Oral Phase Functional Implications: Prolonged oral transit;Impaired mastication;Left lateral sulci pocketing(Prolonged mastication time; spillage of foods during intake) Pharyngeal Phase Impairments: Cough - Delayed(during mastication of solids - Juice/saliva?) Other Comments: pt needed small bites of foods for best control and oral clearing       Jerilynn Som, MS, CCC-SLP Watson,Katherine 09/30/2018,2:45 PM

## 2018-09-30 NOTE — Progress Notes (Signed)
OT Cancellation Note  Patient Details Name: Cory Harvey MRN: [id number] DOB: 1961/03/13   Cancelled Treatment:    Reason Eval/Treat Not Completed: Other (comment). Order received, chart reviewed. Pt with RN upon initial attempt, RN to place an IV, requesting OT come back. Will re-attempt at later time as pt is available and medically appropriate.   Richrd Prime, MPH, MS, OTR/L ascom [phone] 09/30/18, 9:02 AM
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
## 2018-09-30 NOTE — Progress Notes (Signed)
*  PRELIMINARY RESULTS* Echocardiogram 2D Echocardiogram has been performed.  Cory Harvey 09/30/2018, 12:31 PM

## 2018-10-01 DIAGNOSIS — Z823 Family history of stroke: Secondary | ICD-10-CM | POA: Diagnosis not present

## 2018-10-01 DIAGNOSIS — R32 Unspecified urinary incontinence: Secondary | ICD-10-CM | POA: Diagnosis present

## 2018-10-01 DIAGNOSIS — G8194 Hemiplegia, unspecified affecting left nondominant side: Secondary | ICD-10-CM | POA: Diagnosis present

## 2018-10-01 DIAGNOSIS — T7401XA Adult neglect or abandonment, confirmed, initial encounter: Secondary | ICD-10-CM | POA: Diagnosis present

## 2018-10-01 DIAGNOSIS — I69328 Other speech and language deficits following cerebral infarction: Secondary | ICD-10-CM | POA: Diagnosis not present

## 2018-10-01 DIAGNOSIS — R29712 NIHSS score 12: Secondary | ICD-10-CM | POA: Diagnosis present

## 2018-10-01 DIAGNOSIS — R531 Weakness: Secondary | ICD-10-CM | POA: Diagnosis present

## 2018-10-01 DIAGNOSIS — Z79899 Other long term (current) drug therapy: Secondary | ICD-10-CM | POA: Diagnosis not present

## 2018-10-01 DIAGNOSIS — F17210 Nicotine dependence, cigarettes, uncomplicated: Secondary | ICD-10-CM | POA: Diagnosis present

## 2018-10-01 DIAGNOSIS — E785 Hyperlipidemia, unspecified: Secondary | ICD-10-CM | POA: Diagnosis present

## 2018-10-01 DIAGNOSIS — I63511 Cerebral infarction due to unspecified occlusion or stenosis of right middle cerebral artery: Secondary | ICD-10-CM | POA: Diagnosis present

## 2018-10-01 DIAGNOSIS — I10 Essential (primary) hypertension: Secondary | ICD-10-CM | POA: Diagnosis present

## 2018-10-01 DIAGNOSIS — Z7982 Long term (current) use of aspirin: Secondary | ICD-10-CM | POA: Diagnosis not present

## 2018-10-01 DIAGNOSIS — F1410 Cocaine abuse, uncomplicated: Secondary | ICD-10-CM | POA: Diagnosis present

## 2018-10-01 MED ORDER — ESCITALOPRAM OXALATE 10 MG PO TABS: 10.0000 mg | ORAL_TABLET | Freq: Every day | ORAL | 2 refills | Status: DC

## 2018-10-01 NOTE — Clinical Social Work Note (Signed)
Patient is medically stable for discharge today. CSW notified patient and his mother Waymon Budge of discharge to [facility] today. CSW notified Tresa Endo at [facility] of discharge today. Tresa Endo states that they have received [facility] authorization and patient can admit today. Patient will be transported by EMS. RN to call report and call for transport.   Ruthe Mannan MSW, [address] [phone]

## 2018-10-01 NOTE — Progress Notes (Signed)
OT Cancellation Note  Patient Details Name: Cory Harvey MRN: [id number] DOB: June 18, 1961   Cancelled Treatment:    Reason Eval/Treat Not Completed: Other (comment). Upon attempt to treat, pt actively discharging. Unavailable. Will re-attempt as appropriate.   Richrd Prime, MPH, MS, OTR/L ascom [phone] 10/01/18, 4:13 PM

## 2018-10-01 NOTE — Discharge Summary (Addendum)
[facility] - [place] at [facility], 57 y.o., DOB Dec 02, 1961, MRN [id number]. Admission date: 09/29/2018 Discharge Date 10/01/2018 Primary MD Marisue Ivan, MD Admitting Physician Ramonita Lab, MD  Admission Diagnosis  Cerebrovascular accident (CVA), unspecified mechanism [facility]) [I63.9]  Discharge Diagnosis   Active Problems: Acute right posterior MCA distribution stroke Multiple recurrent strokes Essential hypertension Tobacco abuse Cocaine abuse  Hospital Course  Patient with history of recurrent stroke presented with worsening left-sided weakness and dysarthria and fall.Recent work-up with echocardiogram (08/09/2018) did not show cardiac source of emboli with EF of 50-55%.  He has had TEE done on 4/19 which did not show any significant abnormality, no evidence of PFO, significant valve disease or left atrial abnormalities.  Patient again had an MRI which showed acute stroke with worsening symptoms.  Patient was seen by physical therapy and the recommending rehab.  Patient also was seen by neurology who recommended continuing current regimen. I have contacted [facility] cardiology who will arrange prolonged cardiac monitoring.   Patient will have dysphasia to diet with thin liquid        Consults  neurology  Significant Tests:  See full reports for all details     Ct Angio Head W Or Wo Contrast  Result Date: 09/30/2018 CLINICAL DATA:  Follow-up stroke.  Right-sided stent. EXAM: CT ANGIOGRAPHY HEAD AND NECK TECHNIQUE: Multidetector CT imaging of the head and neck was performed using the standard protocol during bolus administration of intravenous contrast. Multiplanar CT image reconstructions and MIPs were obtained to evaluate the vascular anatomy. Carotid stenosis measurements (when applicable) are obtained utilizing NASCET criteria, using the distal internal carotid diameter as the denominator. CONTRAST:  75mL ISOVUE-370 IOPAMIDOL  (ISOVUE-370) INJECTION 76% COMPARISON:  CT done yesterday. MRI 08/08/2018. previous angiography. FINDINGS: CTA NECK FINDINGS Aortic arch: Aortic atherosclerosis. No aneurysm or dissection. Branching pattern of the brachiocephalic vessels is normal without stenosis. Right carotid system: Common carotid artery widely patent to the bifurcation. Carotid bifurcation is normal. There is calcified plaque of the distal ICA bulb but no stenosis. Cervical ICA widely patent beyond that. Left carotid system: Common carotid artery widely patent to the bifurcation. The bifurcation is normal. There is mild soft and calcified plaque of the ICA bulb but no stenosis. Cervical ICA is normal beyond that. Vertebral arteries: Both vertebral arteries are widely patent. The right vertebral artery takes an early origin from the subclavian. Both vertebral artery origins are widely patent and both vertebral arteries are widely patent through the cervical region to the foramen magnum. Skeleton: Ordinary mid cervical spondylosis. Other neck: No soft tissue mass or lymphadenopathy. Upper chest: Normal Review of the MIP images confirms the above findings CTA HEAD FINDINGS Anterior circulation: Both internal carotid arteries are patent through the skull base and siphon regions. There is ordinary siphon peripheral atherosclerotic calcification but no siphon stenosis. The anterior and middle cerebral vessels are patent. On the left, there is no stenosis, aneurysm or vascular malformation. On the right, there are stents within the MCA territory, both in the M1 segment and in the insular tear parietal M 2 to M3 region. I do not clearly see outflow from the more peripheral stented vessel. This may be occluded. This vessels appears the area of infarction that was acute in August. Posterior circulation: Both vertebral arteries are patent through the foramen magnum to the basilar. Mild atherosclerotic change of the V4 segments but without flow limiting  stenosis. The basilar artery shows some atherosclerotic irregularity but no flow  limiting stenosis. Superior cerebellar and posterior cerebral arteries are patent. There is chronic stenosis of the P1 segment on the left, with most of the left PCA flow deriving from the anterior circulation via a patent posterior communicating artery. No apparent change since June of this year. Venous sinuses: Patent and normal. Anatomic variants: None significant otherwise. Delayed phase: No abnormal enhancement. Review of the MIP images confirms the above findings IMPRESSION: Mild atherosclerotic change at the internal carotid bulbs but no stenosis. Right M1 stent shows continued patency. Peripheral right MCA branch vessel stent appears to be occluded. There is late subacute infarction in the right parietal region served by this vessel. Chronic left P1 stenosis with left PCA territory receiving supply from the stenotic P1 segment and a posterior communicating artery from the anterior circulation. Electronically Signed   By: Paulina Fusi M.D.   On: 09/30/2018 10:33   Ct Head Wo Contrast  Result Date: 09/29/2018 CLINICAL DATA:  Fall today. EXAM: CT HEAD WITHOUT CONTRAST TECHNIQUE: Contiguous axial images were obtained from the base of the skull through the vertex without intravenous contrast. COMPARISON:  CT scan of August 08, 2018. FINDINGS: Brain: Mild diffuse cortical atrophy is noted. Mild chronic ischemic white matter disease is noted. Old pontine infarction is noted. Old right posterior parietal infarction is noted. No mass effect or midline shift is noted. Ventricular size is within normal limits. There is no evidence of mass lesion, hemorrhage or acute infarction. Vascular: Stent is again noted in right middle cerebral artery. Skull: Normal. Negative for fracture or focal lesion. Sinuses/Orbits: No acute finding. Other: None. IMPRESSION: Mild diffuse cortical atrophy. Mild chronic ischemic white matter disease. Old right  pontine and posterior parietal infarctions. No acute intracranial abnormality seen. Electronically Signed   By: Lupita Raider, M.D.   On: 09/29/2018 18:53   Ct Angio Neck W Or Wo Contrast  Result Date: 09/30/2018 CLINICAL DATA:  Follow-up stroke.  Right-sided stent. EXAM: CT ANGIOGRAPHY HEAD AND NECK TECHNIQUE: Multidetector CT imaging of the head and neck was performed using the standard protocol during bolus administration of intravenous contrast. Multiplanar CT image reconstructions and MIPs were obtained to evaluate the vascular anatomy. Carotid stenosis measurements (when applicable) are obtained utilizing NASCET criteria, using the distal internal carotid diameter as the denominator. CONTRAST:  75mL ISOVUE-370 IOPAMIDOL (ISOVUE-370) INJECTION 76% COMPARISON:  CT done yesterday. MRI 08/08/2018. previous angiography. FINDINGS: CTA NECK FINDINGS Aortic arch: Aortic atherosclerosis. No aneurysm or dissection. Branching pattern of the brachiocephalic vessels is normal without stenosis. Right carotid system: Common carotid artery widely patent to the bifurcation. Carotid bifurcation is normal. There is calcified plaque of the distal ICA bulb but no stenosis. Cervical ICA widely patent beyond that. Left carotid system: Common carotid artery widely patent to the bifurcation. The bifurcation is normal. There is mild soft and calcified plaque of the ICA bulb but no stenosis. Cervical ICA is normal beyond that. Vertebral arteries: Both vertebral arteries are widely patent. The right vertebral artery takes an early origin from the subclavian. Both vertebral artery origins are widely patent and both vertebral arteries are widely patent through the cervical region to the foramen magnum. Skeleton: Ordinary mid cervical spondylosis. Other neck: No soft tissue mass or lymphadenopathy. Upper chest: Normal Review of the MIP images confirms the above findings CTA HEAD FINDINGS Anterior circulation: Both internal carotid  arteries are patent through the skull base and siphon regions. There is ordinary siphon peripheral atherosclerotic calcification but no siphon stenosis. The anterior and middle  cerebral vessels are patent. On the left, there is no stenosis, aneurysm or vascular malformation. On the right, there are stents within the MCA territory, both in the M1 segment and in the insular tear parietal M 2 to M3 region. I do not clearly see outflow from the more peripheral stented vessel. This may be occluded. This vessels appears the area of infarction that was acute in August. Posterior circulation: Both vertebral arteries are patent through the foramen magnum to the basilar. Mild atherosclerotic change of the V4 segments but without flow limiting stenosis. The basilar artery shows some atherosclerotic irregularity but no flow limiting stenosis. Superior cerebellar and posterior cerebral arteries are patent. There is chronic stenosis of the P1 segment on the left, with most of the left PCA flow deriving from the anterior circulation via a patent posterior communicating artery. No apparent change since June of this year. Venous sinuses: Patent and normal. Anatomic variants: None significant otherwise. Delayed phase: No abnormal enhancement. Review of the MIP images confirms the above findings IMPRESSION: Mild atherosclerotic change at the internal carotid bulbs but no stenosis. Right M1 stent shows continued patency. Peripheral right MCA branch vessel stent appears to be occluded. There is late subacute infarction in the right parietal region served by this vessel. Chronic left P1 stenosis with left PCA territory receiving supply from the stenotic P1 segment and a posterior communicating artery from the anterior circulation. Electronically Signed   By: Paulina Fusi M.D.   On: 09/30/2018 10:33   [name]  Result Date: 09/30/2018 CLINICAL DATA:  Worsening LEFT-sided weakness and dysarthria. Fall from couch, found  down. History of recurrent stroke, hypertension, hyperlipidemia, RIGHT M1 stent. EXAM: MRI HEAD WITHOUT CONTRAST TECHNIQUE: Multiplanar, multiecho pulse sequences of the brain and surrounding structures were obtained without intravenous contrast. COMPARISON:  MRI head August 08, 2018 FINDINGS: Moderately motion degraded examination. INTRACRANIAL CONTENTS: Patchy to confluent RIGHT frontoparietal, RIGHT basal ganglia, with decreased to normalized ADC values. Susceptibility artifact RIGHT insula unchanged from prior MRI. Minimal probable mineralization RIGHT parietal lobe. RIGHT frontoparietal and RIGHT temporal encephalomalacia. Confluent supratentorial white matter FLAIR T2 hyperintensities. Old small RIGHT ACA territory infarct with mild ex vacuo dilatation RIGHT lateral ventricle. Old cystic RIGHT basal ganglia, LEFT greater than RIGHT thalamus and pontine infarcts. Old small LEFT inferior cerebellar infarct. No midline shift, mass effect or masses. Mild parenchymal brain volume loss. No hydrocephalus. No abnormal extra-axial fluid collections. VASCULAR: Dolichoectatic intracranial flow voids, vascular anatomy better demonstrated on today's CTA HEAD and neck. SKULL AND UPPER CERVICAL SPINE: No abnormal sellar expansion. No suspicious calvarial bone marrow signal. Craniocervical junction maintained. SINUSES/ORBITS: The mastoid air-cells and included paranasal sinuses are well-aerated.The included ocular globes and orbital contents are non-suspicious. OTHER: None. IMPRESSION: 1. Moderately motion degraded examination. Multifocal acute and subacute on chronic RIGHT MCA territory infarcts. 2. Severe chronic small vessel ischemic changes. 3. Old small RIGHT ACA territory infarct. Multiple old small basal ganglia, thalami pontine and cerebellar infarcts. 4. Mild parenchymal brain volume loss for age. Electronically Signed   By: Awilda Metro M.D.   On: 09/30/2018 14:19   Dg Chest Portable 1 View  Result Date:  09/29/2018 CLINICAL DATA:  57 year old male with a history of weakness and stroke EXAM: PORTABLE CHEST 1 VIEW COMPARISON:  08/08/2018 FINDINGS: Cardiomediastinal silhouette unchanged in size and contour. No pneumothorax or pleural effusion. No confluent airspace disease. No acute displaced fracture. Degenerative changes of the spine. IMPRESSION: Negative for acute cardiopulmonary disease Electronically Signed  By: Gilmer Mor D.O.   On: 09/29/2018 15:50       Today   Subjective:   Cory Harvey patient continues to have left-sided weakness Objective:   Blood pressure (!) 132/93, pulse 74, temperature 98.2 F (36.8 C), temperature source Oral, resp. rate 16, height 5\' 7"  (1.702 m), weight 76.2 kg, SpO2 96 %.  .  Intake/Output Summary (Last 24 hours) at 10/01/2018 0952 Last data filed at 09/30/2018 2319 Gross per 24 hour  Intake 1878.73 ml  Output -  Net 1878.73 ml    Exam VITAL SIGNS: Blood pressure (!) 132/93, pulse 74, temperature 98.2 F (36.8 C), temperature source Oral, resp. rate 16, height 5\' 7"  (1.702 m), weight 76.2 kg, SpO2 96 %.  GENERAL:  57 y.o.-year-old patient lying in the bed with no acute distress.  EYES: Pupils equal, round, reactive to light and accommodation. No scleral icterus. Extraocular muscles intact.  HEENT: Head atraumatic, normocephalic. Oropharynx and nasopharynx clear.  NECK:  Supple, no jugular venous distention. No thyroid enlargement, no tenderness.  LUNGS: Normal breath sounds bilaterally, no wheezing, rales,rhonchi or crepitation. No use of accessory muscles of respiration.  CARDIOVASCULAR: S1, S2 normal. No murmurs, rubs, or gallops.  ABDOMEN: Soft, nontender, nondistended. Bowel sounds present. No organomegaly or mass.  EXTREMITIES: No pedal edema, cyanosis, or clubbing.  NEUROLOGIC: Cranial nerves II through XII are intact.  Left-sided weakness of the upper extremity PSYCHIATRIC: The patient is alert and oriented x 3.  SKIN: No obvious  rash, lesion, or ulcer.   Data Review     CBC w Diff:  Lab Results  Component Value Date   WBC 7.8 09/30/2018   HGB 13.4 09/30/2018   HGB 14.8 09/21/2014   HCT 40.8 09/30/2018   HCT 45.9 09/21/2014   PLT 451 (H) 09/30/2018   PLT 306 09/21/2014   LYMPHOPCT 16 09/30/2018   LYMPHOPCT 1.6 09/21/2014   MONOPCT 10 09/30/2018   MONOPCT 2.1 09/21/2014   EOSPCT 2 09/30/2018   EOSPCT 0.0 09/21/2014   BASOPCT 0 09/30/2018   BASOPCT 0.3 09/21/2014   CMP:  Lab Results  Component Value Date   NA 140 09/29/2018   NA 143 09/21/2014   K 3.9 09/29/2018   K 3.4 (L) 09/21/2014   CL 105 09/29/2018   CL 110 (H) 09/21/2014   CO2 25 09/29/2018   CO2 24 09/21/2014   BUN 10 09/29/2018   BUN 11 09/21/2014   CREATININE 1.07 09/29/2018   CREATININE 1.22 09/21/2014   PROT 6.9 09/29/2018   PROT 7.2 09/20/2014   ALBUMIN 3.7 09/29/2018   ALBUMIN 3.7 09/20/2014   BILITOT 0.7 09/29/2018   BILITOT 0.4 09/20/2014   ALKPHOS 77 09/29/2018   ALKPHOS 76 09/20/2014   AST 19 09/29/2018   AST 25 09/20/2014   ALT 19 09/29/2018   ALT 22 09/20/2014  .  Micro Results No results found for this or any previous visit (from the past 240 hour(s)).      Code Status Orders  (From admission, onward)         Start     Ordered   09/29/18 2025  Full code  Continuous     09/29/18 2025        Code Status History    Date Active Date Inactive Code Status Order ID Comments User Context   08/09/2018 0244 08/16/2018 1446 Full Code [id number]  Barbaraann Rondo, MD Inpatient   05/25/2018 0727 05/27/2018 1051 Full Code [id number]  Julieanne Cotton, MD Inpatient  05/25/2018 0232 05/25/2018 0727 Full Code [id number]  Rejeana Brock, MD ED   03/29/2018 1351 04/04/2018 1230 Full Code [id number]  Charlton Amor, PA-C Inpatient   03/29/2018 1351 03/29/2018 1351 Full Code [id number]  Lynnae Prude Inpatient   03/26/2018 1845 03/29/2018 1349 Full Code [id number]  Ihor Austin, MD Inpatient   09/02/2017  1936 09/04/2017 1722 Full Code [id number]  Marguarite Arbour, MD Inpatient          Follow-up Information    Marisue Ivan, MD Follow up in 6 day(s).   Specialty:  Family Medicine Contact information: [address] [facility] [place] [address] [phone]        Lonell Face, MD Follow up in 2 week(s).   Specialty:  Neurology Why:  recurrent cva Contact information: [address] [facility] [place] [address] [phone]           Discharge Medications   Allergies as of 10/01/2018   No Known Allergies     Medication List    TAKE these medications   aspirin 81 MG chewable tablet Chew 1 tablet (81 mg total) by mouth daily.   atorvastatin 40 MG tablet Commonly known as:  LIPITOR Take 1 tablet (40 mg total) by mouth at bedtime.   bisacodyl 5 MG EC tablet Commonly known as:  DULCOLAX Take 1 tablet (5 mg total) by mouth daily as needed for moderate constipation.   divalproex 125 MG capsule Commonly known as:  DEPAKOTE SPRINKLE Take 125 mg by mouth 2 (two) times daily.   escitalopram 10 MG tablet Commonly known as:  LEXAPRO Take 1 tablet (10 mg total) by mouth daily.   FLUoxetine 10 MG capsule Commonly known as:  PROZAC Take 10 mg by mouth daily.   losartan 25 MG tablet Commonly known as:  COZAAR Take 1 tablet (25 mg total) by mouth daily.   senna-docusate 8.6-50 MG tablet Commonly known as:  Senokot-S Take 1 tablet by mouth at bedtime as needed for mild constipation.   ticagrelor 90 MG Tabs tablet Commonly known as:  BRILINTA Take 1 tablet (90 mg total) by mouth 2 (two) times daily.          Total Time in preparing paper work, data evaluation and todays exam - 35 minutes  Auburn Bilberry M.D on 10/01/2018 at 9:52 AM [facility]   Office  [phone]

## 2018-10-01 NOTE — Clinical Social Work Note (Signed)
CSW spoke with patient regarding bed offers patient unable to tell CSW what facility he would like. CSW contacted patient's mother Waymon Budge [phone]. CSW gave patient's mother bed offers and she chose [facility]. CSW notified Tresa Endo at [facility] of bed acceptance. Tresa Endo will begin [facility] authorization. CSW will continue to follow for discharge planning.   Ruthe Mannan MSW, [address] [phone]

## 2018-10-01 NOTE — Progress Notes (Signed)
Report called to [facility]. Gleen Ripberger S, RN  

## 2018-10-01 NOTE — Progress Notes (Signed)
EMS contacted for transport.  Danielle Fumio Vandam, RN, BSN 

## 2018-10-01 NOTE — Progress Notes (Signed)
Patient discharged via EMS. Priyah Schmuck S, RN  

## 2018-10-01 NOTE — Progress Notes (Signed)
Physical Therapy Treatment Patient Details Name: Cory Harvey MRN: [id number] DOB: 06-Mar-1961 Today's Date: 10/01/2018    History of Present Illness 57 y.o. 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  Patient fell off of the couch and he was found on the floor and had been lying there for approximately 5 hours as reported by the EMS.  Patient was found on the floor by his niece.  Patient was recently discharged from the hospital to [facility] in September but now he is at home under his brother's care.  He feels like his neglected by his brother.  Initial CT head is negative.    PT Comments    Improved ability to achieve and maintain midline in status sitting position (with wedge under L IT) today, min assist to periods of close supervision.  Increased visual attention to L visual field and hemi-body this date, but continues to require cuing for consistent attention.  Patient intermittent emotional throughout session, constantly asking "am I doing good?"; provided with consistent encouragement and feedback. Will continue mobility progression with emphasis on postural alignment, dynamic weight shifting to minimize pushing behaviors. Positioned in R sidelying end of session to promote continued weight acceptance through R hemi body (with associated passive closure of L lateral trunk).    Follow Up Recommendations  SNF     Equipment Recommendations       Recommendations for Other Services       Precautions / Restrictions Precautions Precautions: Fall Restrictions Weight Bearing Restrictions: No    Mobility  Bed Mobility Overal bed mobility: Needs Assistance Bed Mobility: Supine to Sit;Sit to Supine     Supine to sit: Mod assist Sit to supine: Mod  assist   General bed mobility comments: assist for position/protection and movement of L hemi-body  Transfers Overall transfer level: Needs assistance Equipment used: 2 person hand held assist Transfers: Sit to/from Stand           General transfer comment: extensive assist for midline in A/P and M/L plane; heavy pushing behaviors to L and posterior.  Constantly attempting to step/move R LE due to impaired midline.  Ambulation/Gait             General Gait Details: unsafe/unable   Stairs             Wheelchair Mobility    Modified Rankin (Stroke Patients Only)       Balance Overall balance assessment: Needs assistance Sitting-balance support: No upper extremity supported;Feet supported Sitting balance-Leahy Scale: Fair Sitting balance - Comments: improved tolerance for midline with wedge under L IT (maintains with close sup to min assist with fatigue)   Standing balance support: Bilateral upper extremity supported Standing balance-Leahy Scale: Zero Standing balance comment: significant pushing to L posteiror/lateral direction                            Cognition Arousal/Alertness: Awake/alert Behavior During Therapy: WFL for tasks assessed/performed Overall Cognitive Status: Within Functional Limits for tasks assessed                                 General Comments: intermittently labile (sudden bursts of emotions/tears), but quickly redirectable  Exercises Other Exercises Other Exercises: Unsupported sitting, wedge under L IT and mod manual cuing L lateral trunk, worked on R UE reaching (modified D2 extension) to various angles.  Incorporated reach/grasp of specific items, visual scanning to locate and identify items. MAx cuing for L visual scanning/awareness; min assist/cuing for object recognition and use.  Improved tolerance for R ant/lateral weight shift this date, further improved with self-initiation of functional  reaching activity Other Exercises: Sit/stand x3 with bilat HHA, max assist +2 for midline.  Attempted placement of elevated bedside table to R of patient (for environmental support, tactile feedback and R UE placement/flexion).  Unable to reach neutral midline in any plane despite cuing, environmental modificaitons due to pushing behaviors.    General Comments        Pertinent Vitals/Pain Pain Assessment: Faces Pain Score: 0-No pain    Home Living                      Prior Function            PT Goals (current goals can now be found in the care plan section) Acute Rehab PT Goals Patient Stated Goal: to return home PT Goal Formulation: With patient Time For Goal Achievement: 10/14/18 Potential to Achieve Goals: Fair Progress towards PT goals: Progressing toward goals    Frequency    7X/week      PT Plan Current plan remains appropriate    Co-evaluation              AM-PAC PT "6 Clicks" Daily Activity  Outcome Measure  Difficulty turning over in bed (including adjusting bedclothes, sheets and blankets)?: Unable Difficulty moving from lying on back to sitting on the side of the bed? : Unable Difficulty sitting down on and standing up from a chair with arms (e.g., wheelchair, bedside commode, etc,.)?: Unable Help needed moving to and from a bed to chair (including a wheelchair)?: Total Help needed walking in hospital room?: Total Help needed climbing 3-5 steps with a railing? : Total 6 Click Score: 6    End of Session Equipment Utilized During Treatment: Gait belt Activity Tolerance: Patient tolerated treatment well Patient left: in bed;with call bell/phone within reach;with bed alarm set Nurse Communication: Mobility status PT Visit Diagnosis: Muscle weakness (generalized) (M62.81);Difficulty in walking, not elsewhere classified (R26.2);Hemiplegia and hemiparesis Hemiplegia - Right/Left: Left Hemiplegia - dominant/non-dominant: Dominant Hemiplegia  - caused by: Cerebral infarction     Time: 8119-1478 PT Time Calculation (min) (ACUTE ONLY): 47 min  Charges:  $Therapeutic Activity: 8-22 mins $Neuromuscular Re-education: 38-52 mins                    Onica Davidovich H. Manson Passey, PT, DPT, NCS 10/01/18, 9:04 PM [phone]

## 2018-10-02 LAB — HEMOGLOBIN A1C
Hgb A1c MFr Bld: 5.8 % — ABNORMAL HIGH (ref 4.8–5.6)
MEAN PLASMA GLUCOSE: 120 mg/dL

## 2018-10-02 LAB — HIV ANTIBODY (ROUTINE TESTING W REFLEX): HIV SCREEN 4TH GENERATION: NONREACTIVE

## 2018-10-03 ENCOUNTER — Telehealth: Payer: Self-pay | Admitting: *Deleted

## 2018-10-03 DIAGNOSIS — I639 Cerebral infarction, unspecified: Secondary | ICD-10-CM

## 2018-10-03 NOTE — Telephone Encounter (Signed)
Call placed to the patient. His mother stated that he was still in the hospital.   Orders have been placed for a 30 day Zio monitor.

## 2018-10-03 NOTE — Telephone Encounter (Addendum)
-----   Message ----- From: Cory Harvey Sent: 10/01/2018  12:05 PM EDT To: Mickie Bail Burl Triage  Dr. Allena Katz wants this patient to have an event monitor placed following his discharge. He will be discharged to [facility]. Dx: stroke.

## 2018-10-08 NOTE — Addendum Note (Signed)
Addended by: Sandi Mariscal on: 10/08/2018 04:00 PM   Modules accepted: Orders

## 2018-10-08 NOTE — Telephone Encounter (Signed)
Attempted to call and schedule for patient appointment  Was put on hold for long time  Will try again at a later time

## 2018-10-08 NOTE — Telephone Encounter (Signed)
Call placed to the patient. The patient's family member stated that the patient was at [facility] and was nonverbal due to the stroke.  Per Eula Listen, PA, the patient should come in for a follow up appointment and the [facility] monitor can be placed at that time. Message has been sent to scheduling.

## 2018-10-11 NOTE — Telephone Encounter (Signed)
Lm with [facility] to call and schedule this appointment

## 2018-10-14 NOTE — Telephone Encounter (Signed)
Patient scheduled to see you on 10/22/18.

## 2018-10-14 NOTE — Telephone Encounter (Signed)
Patient is now scheduled for 10/22/18 with Alycia Rossetti

## 2018-10-22 ENCOUNTER — Ambulatory Visit: Payer: Medicare Other | Admitting: Physician Assistant

## 2018-10-22 NOTE — Progress Notes (Deleted)
   Cardiology Office Note Date:  10/22/2018  Patient ID:  Cory Harvey, DOB [birth year], MRN [id number] PCP:  Marisue IvanLinthavong, Kanhka, MD  Cardiologist:  Dr. ***, MD  ***refresh   Chief Complaint: Hospital follow-up  History of Present Illness: Cory Harvey is a 57 y.o. 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  Patient was admitted to the hospital on 08/2017 with   Past Medical History:  Diagnosis Date  . Depression   . History of CVA (cerebrovascular accident)   . Hypertension   . Stroke (HCC)    x 4  . Tobacco abuse     Past Surgical History:  Procedure Laterality Date  . IR CT HEAD LTD  05/25/2018  . IR INTRA CRAN STENT  05/25/2018  . kidney surgery Left 1983  . RADIOLOGY WITH ANESTHESIA N/A 05/25/2018   Procedure: RADIOLOGY WITH ANESTHESIA;  Surgeon: Julieanne Cottoneveshwar, Sanjeev, MD;  Location: [facility] OR;  Service: Radiology;  Laterality: N/A;  . TEE WITHOUT CARDIOVERSION N/A 03/27/2018   Procedure: TRANSESOPHAGEAL ECHOCARDIOGRAM (TEE);  Surgeon: Dalia HeadingFath, Kenneth A, MD;  Location: [facility] ORS;  Service: Cardiovascular;  Laterality: N/A;    No outpatient medications have been marked as taking for the 10/22/18 encounter (Appointment) with Sondra Bargesunn, Nakhia Levitan M, PA-C.    Allergies:   Patient has no known allergies.   Social History:  The patient  reports that he has been smoking cigarettes. He has been smoking about 0.25 packs per day. He has never used smokeless tobacco. He reports that he drank alcohol. He reports that he does not use drugs.   Family History:  The patient's family history includes Diabetes in his father and mother; Heart disease in his mother; Hyperlipidemia in his mother; Hypertension in his father and mother; Stroke in his mother.  ROS:   ROS    PHYSICAL EXAM: *** VS:  There were no vitals taken for this visit. BMI: There is no height or weight on file to calculate BMI.  Physical Exam   EKG:  Was ordered and interpreted by me today. Shows ***  Recent Labs: 08/09/2018: Magnesium 2.1 09/29/2018: ALT 19; BUN 10; Creatinine, Ser 1.07; Potassium 3.9; Sodium 140 09/30/2018: Hemoglobin 13.4; Platelets 451  09/30/2018: Cholesterol 115; HDL 38; LDL Cholesterol 69; Total CHOL/HDL Ratio 3.0; Triglycerides 42; VLDL 8   CrCl cannot be calculated (Patient's most recent lab result is older than the maximum 21 days allowed.).   Wt Readings from Last 3 Encounters:  09/29/18 168 lb (76.2 kg)  08/09/18 157 lb 1.6 oz (71.3 kg)  07/04/18 176 lb (79.8 kg)     Other studies reviewed: Additional studies/records reviewed today include: summarized above  ASSESSMENT AND PLAN:  1. ***  Disposition: F/u with Dr.***or an APP in ***  Current medicines are reviewed at length with the patient today.  The patient did not have any concerns regarding medicines.  Signed, Eula Listenyan Adamariz Gillott, PA-C 10/22/2018 7:14 AM     [facility] [address] [place], [place] [address] [phone]

## 2018-10-23 ENCOUNTER — Encounter: Payer: Self-pay | Admitting: Internal Medicine

## 2019-07-03 IMAGING — CT CT HEAD CODE STROKE
3 of 4 series · 13 of 47 positions shown, 15 images · non-contrast
Comparison: MRI head March 26, 2018

CLINICAL DATA: Code stroke. LEFT-sided weakness and slurred speech.
Last seen normal at 8206 hours. History of stroke, hypertension.

EXAM:
CT HEAD WITHOUT CONTRAST
TECHNIQUE: Contiguous axial images were obtained from the base of the skull
through the vertex without intravenous contrast.

[Series 3: head wo · axial · 0.47mm/px · z∈[-102,+18]mm · 7 of 34 slices shown, 9 images]
[im 5/34  brain]
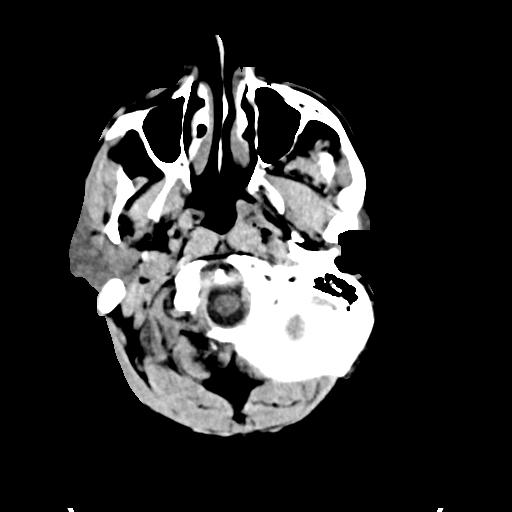
[im 5/34  bone]
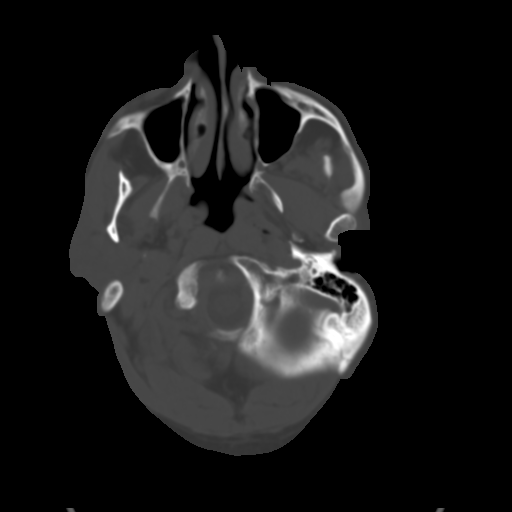
[im 9/34  brain]
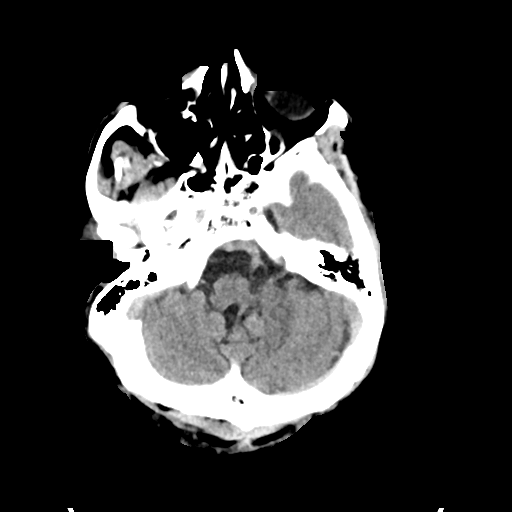
[im 13/34  brain]
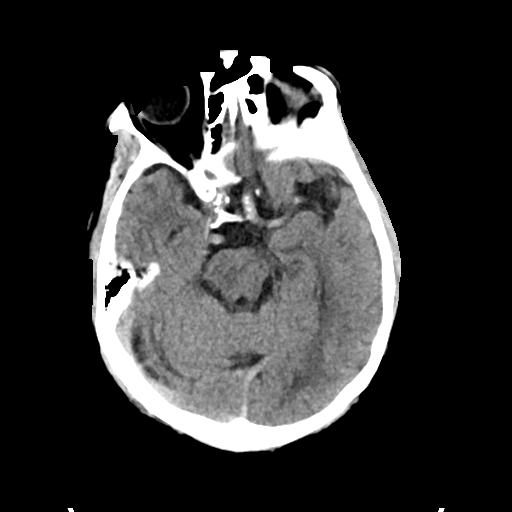
[im 17/34  brain]
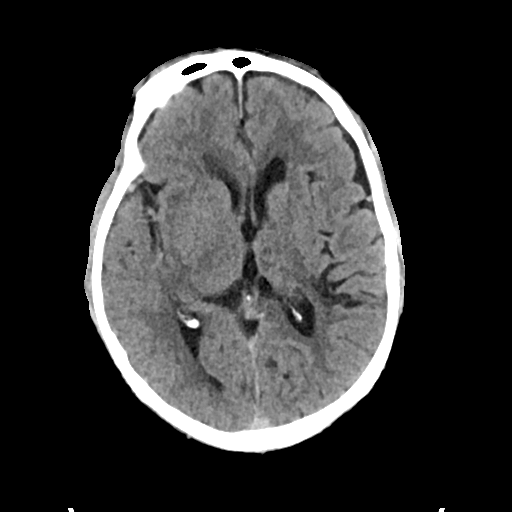
[im 21/34  brain]
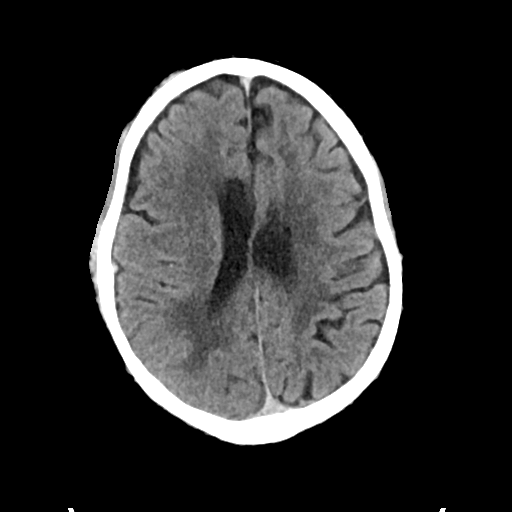
[im 21/34  bone]
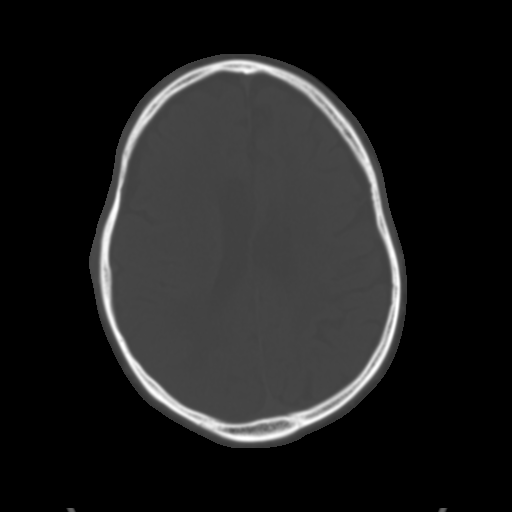
[im 25/34  brain]
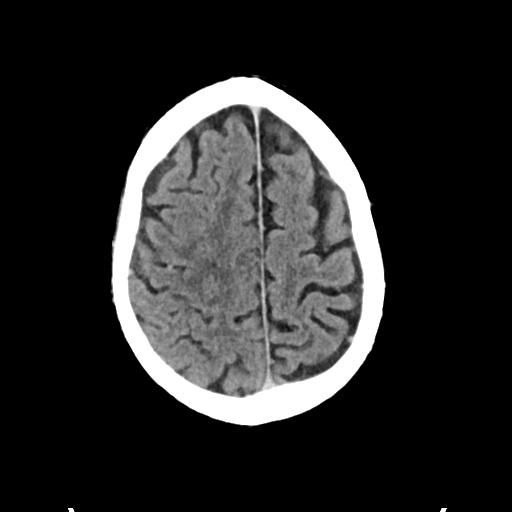
[im 29/34  brain]
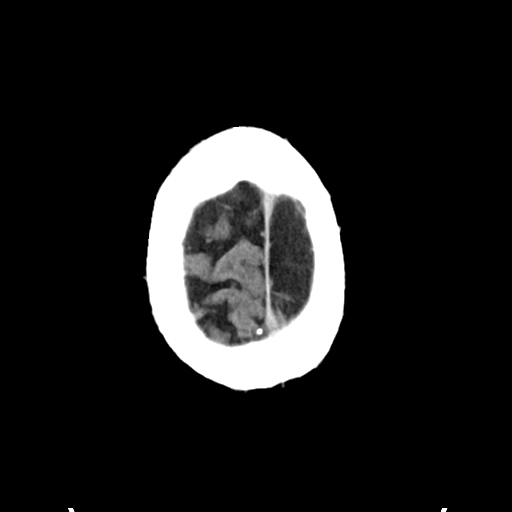

[Series 5: cor soft · coronal · 0.33mm/px · 3 of 75 slices shown]
[im 25/75  brain]
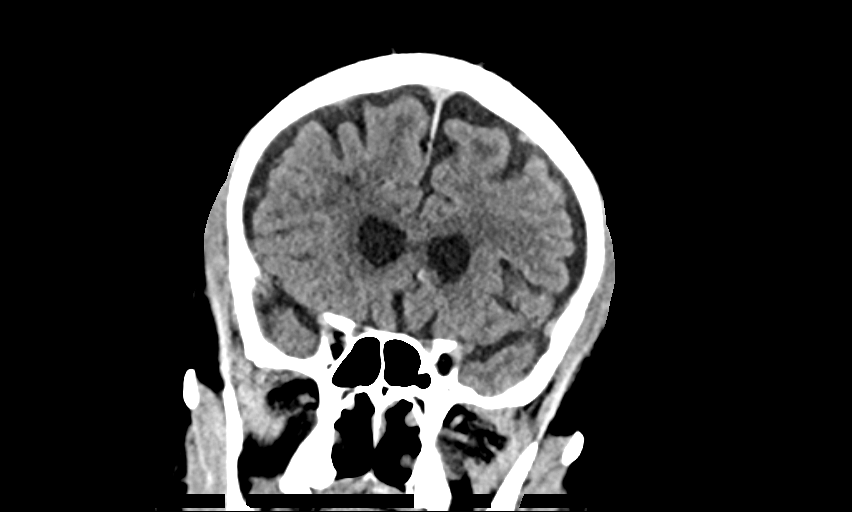
[im 33/75  brain]
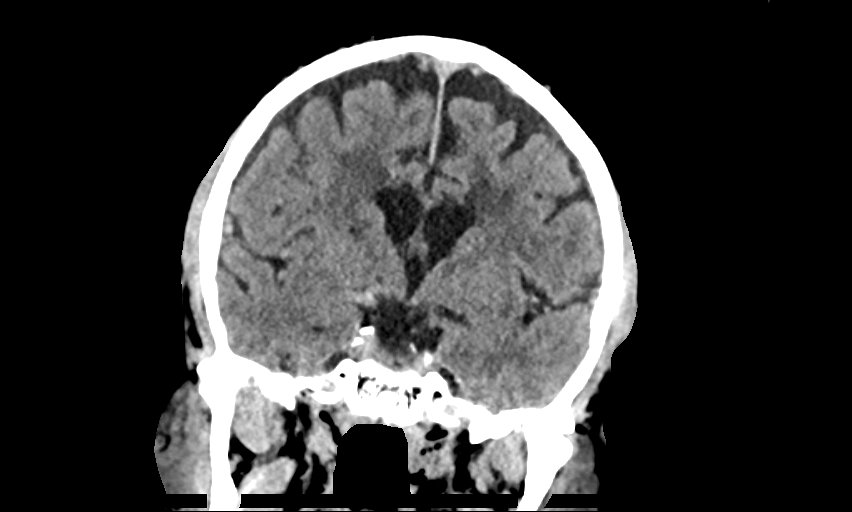
[im 42/75  brain]
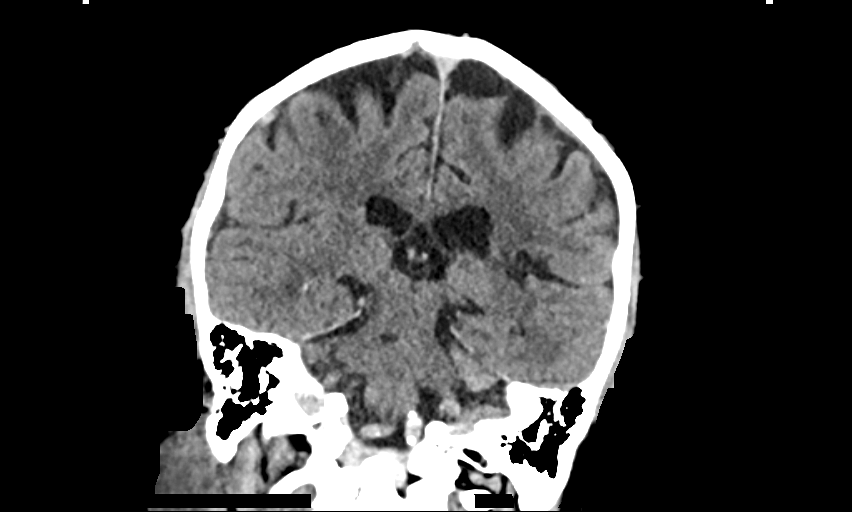

[Series 6: sag soft · sagittal · 0.33mm/px · 3 of 66 slices shown]
[im 22/66  brain]
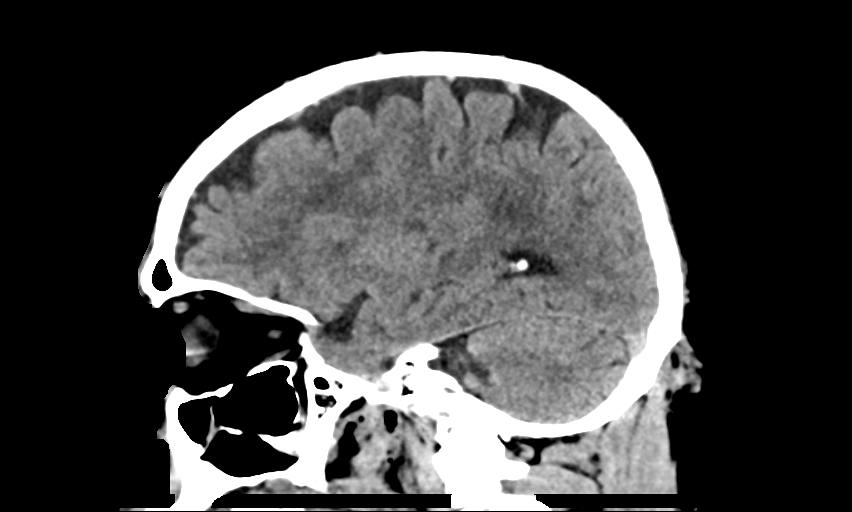
[im 33/66  brain]
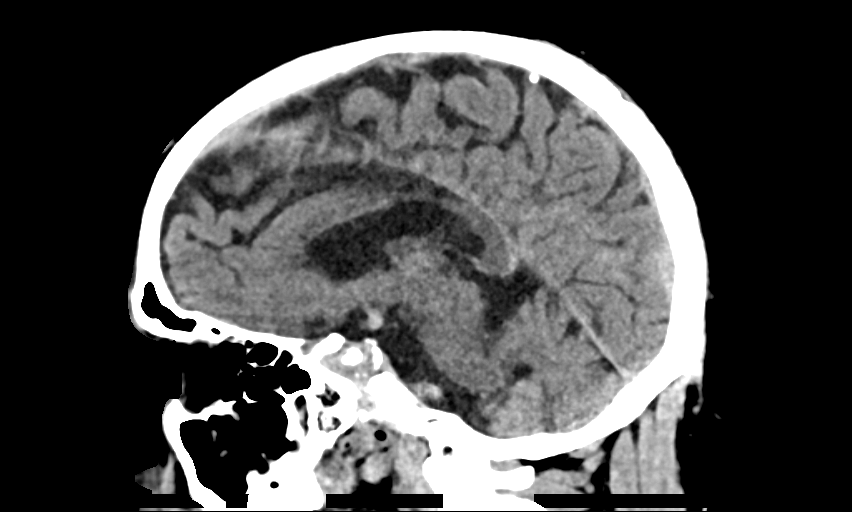
[im 44/66  brain]
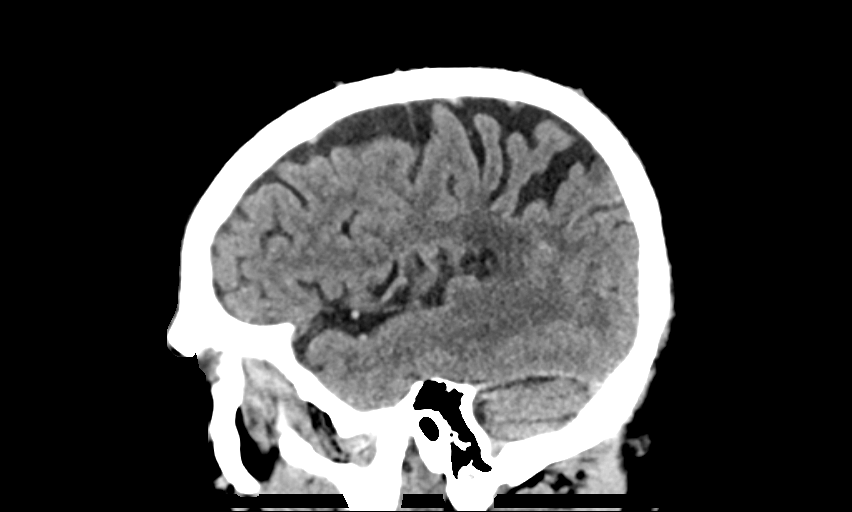

[13 of 47 positions shown; findings below may reference images not displayed]

FINDINGS: BRAIN: No intraparenchymal hemorrhage, mass effect nor midline
shift. Old pontine infarct. Old bilateral basal ganglia and thalami
infarcts. Old LEFT posterior insula to parietal infarct. Confluent
supratentorial white matter hypodensities. Moderate parenchymal
brain volume loss. No hydrocephalus. No abnormal extra-axial fluid
collections.

VASCULAR: Unremarkable.

SKULL/SOFT TISSUES: No skull fracture. No significant soft tissue
swelling.

ORBITS/SINUSES: The included ocular globes and orbital contents are
normal.Mild paranasal sinus mucosal thickening. Mastoid air cells
are well aerated. Soft tissue within the RIGHT external auditory
canal most compatible with cerumen.

OTHER: Patient is edentulous.

ASPECTS (Alberta Stroke Program Early CT Score)

- Ganglionic level infarction (caudate, lentiform nuclei, internal
capsule, insula, M1-M3 cortex): 7

- Supraganglionic infarction (M4-M6 cortex): 3

Total score (0-10 with 10 being normal): 10
IMPRESSION: 1. No acute intracranial process.
2. ASPECTS is 10.
3. Old small pontine, basal ganglia and thalamus infarcts. Moderate
to severe chronic small vessel ischemic changes.
4. Old LEFT insular/parietal lobe, MCA territory infarct.
5. Moderate parenchymal brain volume loss.
6. Critical Value/emergent results text paged to Dr.TOMI KADA
KO via [employer] secure system on 05/25/2018 at [DATE],
including interpreting physician's phone number.

## 2020-02-26 ENCOUNTER — Observation Stay
Admission: EM | Admit: 2020-02-26 | Discharge: 2020-02-27 | Disposition: A | Discharge status: Skilled Nursing Facility | Payer: Medicare Other | Attending: Internal Medicine | Admitting: Internal Medicine

## 2020-02-26 ENCOUNTER — Observation Stay: Payer: Medicare Other

## 2020-02-26 ENCOUNTER — Other Ambulatory Visit: Payer: Self-pay

## 2020-02-26 ENCOUNTER — Emergency Department: Payer: Medicare Other

## 2020-02-26 DIAGNOSIS — Z7902 Long term (current) use of antithrombotics/antiplatelets: Secondary | ICD-10-CM | POA: Insufficient documentation

## 2020-02-26 DIAGNOSIS — R269 Unspecified abnormalities of gait and mobility: Secondary | ICD-10-CM | POA: Insufficient documentation

## 2020-02-26 DIAGNOSIS — F1410 Cocaine abuse, uncomplicated: Secondary | ICD-10-CM | POA: Diagnosis not present

## 2020-02-26 DIAGNOSIS — Z7982 Long term (current) use of aspirin: Secondary | ICD-10-CM | POA: Insufficient documentation

## 2020-02-26 DIAGNOSIS — I700 Atherosclerosis of aorta: Secondary | ICD-10-CM | POA: Diagnosis not present

## 2020-02-26 DIAGNOSIS — I69354 Hemiplegia and hemiparesis following cerebral infarction affecting left non-dominant side: Secondary | ICD-10-CM | POA: Diagnosis not present

## 2020-02-26 DIAGNOSIS — R0602 Shortness of breath: Principal | ICD-10-CM | POA: Diagnosis present

## 2020-02-26 DIAGNOSIS — Z8249 Family history of ischemic heart disease and other diseases of the circulatory system: Secondary | ICD-10-CM | POA: Insufficient documentation

## 2020-02-26 DIAGNOSIS — I10 Essential (primary) hypertension: Secondary | ICD-10-CM | POA: Insufficient documentation

## 2020-02-26 DIAGNOSIS — Z8349 Family history of other endocrine, nutritional and metabolic diseases: Secondary | ICD-10-CM | POA: Diagnosis not present

## 2020-02-26 DIAGNOSIS — F17210 Nicotine dependence, cigarettes, uncomplicated: Secondary | ICD-10-CM | POA: Insufficient documentation

## 2020-02-26 DIAGNOSIS — E785 Hyperlipidemia, unspecified: Secondary | ICD-10-CM | POA: Insufficient documentation

## 2020-02-26 DIAGNOSIS — Z833 Family history of diabetes mellitus: Secondary | ICD-10-CM | POA: Insufficient documentation

## 2020-02-26 DIAGNOSIS — F329 Major depressive disorder, single episode, unspecified: Secondary | ICD-10-CM | POA: Diagnosis not present

## 2020-02-26 DIAGNOSIS — Z8673 Personal history of transient ischemic attack (TIA), and cerebral infarction without residual deficits: Secondary | ICD-10-CM

## 2020-02-26 DIAGNOSIS — Z20822 Contact with and (suspected) exposure to covid-19: Secondary | ICD-10-CM | POA: Insufficient documentation

## 2020-02-26 DIAGNOSIS — Z823 Family history of stroke: Secondary | ICD-10-CM | POA: Insufficient documentation

## 2020-02-26 DIAGNOSIS — Z79899 Other long term (current) drug therapy: Secondary | ICD-10-CM | POA: Diagnosis not present

## 2020-02-26 DIAGNOSIS — J189 Pneumonia, unspecified organism: Secondary | ICD-10-CM | POA: Diagnosis not present

## 2020-02-26 DIAGNOSIS — N281 Cyst of kidney, acquired: Secondary | ICD-10-CM | POA: Insufficient documentation

## 2020-02-26 DIAGNOSIS — I69328 Other speech and language deficits following cerebral infarction: Secondary | ICD-10-CM | POA: Insufficient documentation

## 2020-02-26 LAB — CBC WITH DIFFERENTIAL/PLATELET
Abs Immature Granulocytes: 0.03 10*3/uL (ref 0.00–0.07)
Basophils Absolute: 0.1 10*3/uL (ref 0.0–0.1)
Basophils Relative: 0 %
Eosinophils Absolute: 0.3 10*3/uL (ref 0.0–0.5)
Eosinophils Relative: 2 %
HCT: 45 % (ref 39.0–52.0)
Hemoglobin: 14.3 g/dL (ref 13.0–17.0)
Immature Granulocytes: 0 %
Lymphocytes Relative: 8 %
Lymphs Abs: 0.9 10*3/uL (ref 0.7–4.0)
MCH: 27 pg (ref 26.0–34.0)
MCHC: 31.8 g/dL (ref 30.0–36.0)
MCV: 85.1 fL (ref 80.0–100.0)
Monocytes Absolute: 0.6 10*3/uL (ref 0.1–1.0)
Monocytes Relative: 5 %
Neutro Abs: 9.7 10*3/uL — ABNORMAL HIGH (ref 1.7–7.7)
Neutrophils Relative %: 85 %
Platelets: 527 10*3/uL — ABNORMAL HIGH (ref 150–400)
RBC: 5.29 MIL/uL (ref 4.22–5.81)
RDW: 14.9 % (ref 11.5–15.5)
WBC: 11.6 10*3/uL — ABNORMAL HIGH (ref 4.0–10.5)
nRBC: 0 % (ref 0.0–0.2)

## 2020-02-26 LAB — RESPIRATORY PANEL BY RT PCR (FLU A&B, COVID)
Influenza A by PCR: NEGATIVE
Influenza B by PCR: NEGATIVE
SARS Coronavirus 2 by RT PCR: NEGATIVE

## 2020-02-26 LAB — COMPREHENSIVE METABOLIC PANEL
ALT: 17 U/L (ref 0–44)
AST: 19 U/L (ref 15–41)
Albumin: 3.7 g/dL (ref 3.5–5.0)
Alkaline Phosphatase: 71 U/L (ref 38–126)
Anion gap: 10 (ref 5–15)
BUN: 9 mg/dL (ref 6–20)
CO2: 25 mmol/L (ref 22–32)
Calcium: 9.1 mg/dL (ref 8.9–10.3)
Chloride: 102 mmol/L (ref 98–111)
Creatinine, Ser: 0.97 mg/dL (ref 0.61–1.24)
GFR calc Af Amer: >60 mL/min (ref >60)
GFR calc non Af Amer: >60 mL/min (ref >60)
Glucose, Bld: 167 mg/dL — ABNORMAL HIGH (ref 70–99)
Potassium: 3.5 mmol/L (ref 3.5–5.1)
Sodium: 137 mmol/L (ref 135–145)
Total Bilirubin: 0.7 mg/dL (ref 0.3–1.2)
Total Protein: 6.6 g/dL (ref 6.5–8.1)

## 2020-02-26 LAB — TROPONIN I (HIGH SENSITIVITY)
Troponin I (High Sensitivity): 3 ng/L (ref <18)
Troponin I (High Sensitivity): 6 ng/L (ref <18)

## 2020-02-26 LAB — BRAIN NATRIURETIC PEPTIDE: B Natriuretic Peptide: 54 pg/mL (ref 0.0–100.0)

## 2020-02-26 LAB — PROTIME-INR
INR: 1.1 (ref 0.8–1.2)
Prothrombin Time: 14 seconds (ref 11.4–15.2)

## 2020-02-26 LAB — LACTIC ACID, PLASMA
Lactic Acid, Venous: 3.3 mmol/L (ref 0.5–1.9)
Lactic Acid, Venous: 1.6 mmol/L (ref 0.5–1.9)

## 2020-02-26 MED ORDER — ATORVASTATIN CALCIUM 20 MG PO TABS
40.0000 mg | ORAL_TABLET | Freq: Every day | ORAL | Status: DC
  Administered 2020-02-26: 40 mg via ORAL
  Filled 2020-02-26: qty 2

## 2020-02-26 MED ORDER — IPRATROPIUM-ALBUTEROL 0.5-2.5 (3) MG/3ML IN SOLN: 3.0000 mL | Freq: Once | RESPIRATORY_TRACT | Status: DC

## 2020-02-26 MED ORDER — IOHEXOL 350 MG/ML SOLN
75.0000 mL | Freq: Once | INTRAVENOUS | Status: AC | PRN
  Administered 2020-02-26: 75 mL via INTRAVENOUS

## 2020-02-26 MED ORDER — LOSARTAN POTASSIUM 50 MG PO TABS
25.0000 mg | ORAL_TABLET | Freq: Every day | ORAL | Status: DC
  Administered 2020-02-27: 25 mg via ORAL
  Filled 2020-02-26: qty 1

## 2020-02-26 MED ORDER — ENOXAPARIN SODIUM 40 MG/0.4ML ~~LOC~~ SOLN
40.0000 mg | SUBCUTANEOUS | Status: DC
  Administered 2020-02-26: 40 mg via SUBCUTANEOUS
  Filled 2020-02-26: qty 0.4

## 2020-02-26 MED ORDER — SODIUM CHLORIDE 0.9 % IV SOLN
2.0000 g | Freq: Once | INTRAVENOUS | Status: AC
  Administered 2020-02-26: 2 g via INTRAVENOUS
  Filled 2020-02-26: qty 20

## 2020-02-26 MED ORDER — LACTATED RINGERS IV BOLUS
500.0000 mL | Freq: Once | INTRAVENOUS | Status: AC
  Administered 2020-02-27: 500 mL via INTRAVENOUS

## 2020-02-26 MED ORDER — HALOPERIDOL LACTATE 5 MG/ML IJ SOLN
5.0000 mg | Freq: Once | INTRAMUSCULAR | Status: AC
  Administered 2020-02-26: 5 mg via INTRAVENOUS
  Filled 2020-02-26: qty 1

## 2020-02-26 MED ORDER — ONDANSETRON HCL 4 MG/2ML IJ SOLN: 4.0000 mg | Freq: Four times a day (QID) | INTRAMUSCULAR | Status: DC | PRN

## 2020-02-26 MED ORDER — SENNOSIDES-DOCUSATE SODIUM 8.6-50 MG PO TABS: 1.0000 | ORAL_TABLET | Freq: Every evening | ORAL | Status: DC | PRN

## 2020-02-26 MED ORDER — TICAGRELOR 90 MG PO TABS
90.0000 mg | ORAL_TABLET | Freq: Two times a day (BID) | ORAL | Status: DC
  Administered 2020-02-26 – 2020-02-27 (×2): 90 mg via ORAL
  Filled 2020-02-26 (×2): qty 1

## 2020-02-26 MED ORDER — SODIUM CHLORIDE 0.9 % IV SOLN: 2.0000 g | INTRAVENOUS | Status: DC

## 2020-02-26 MED ORDER — ASPIRIN 81 MG PO CHEW
81.0000 mg | CHEWABLE_TABLET | Freq: Every day | ORAL | Status: DC
  Administered 2020-02-27: 81 mg via ORAL
  Filled 2020-02-26: qty 1

## 2020-02-26 MED ORDER — SODIUM CHLORIDE 0.9 % IV SOLN: 500.0000 mg | INTRAVENOUS | Status: DC

## 2020-02-26 MED ORDER — SODIUM CHLORIDE 0.9 % IV SOLN
500.0000 mg | Freq: Once | INTRAVENOUS | Status: AC
  Administered 2020-02-26: 500 mg via INTRAVENOUS
  Filled 2020-02-26: qty 500

## 2020-02-26 MED ORDER — DIVALPROEX SODIUM 125 MG PO CSDR
125.0000 mg | DELAYED_RELEASE_CAPSULE | Freq: Two times a day (BID) | ORAL | Status: DC
  Administered 2020-02-26 – 2020-02-27 (×2): 125 mg via ORAL
  Filled 2020-02-26 (×3): qty 1

## 2020-02-26 MED ORDER — ACETAMINOPHEN 325 MG PO TABS: 650.0000 mg | ORAL_TABLET | Freq: Four times a day (QID) | ORAL | Status: DC | PRN

## 2020-02-26 MED ORDER — ESCITALOPRAM OXALATE 10 MG PO TABS
10.0000 mg | ORAL_TABLET | Freq: Every day | ORAL | Status: DC
  Administered 2020-02-27: 10 mg via ORAL
  Filled 2020-02-26: qty 1

## 2020-02-26 MED ORDER — BISACODYL 5 MG PO TBEC: 5.0000 mg | DELAYED_RELEASE_TABLET | Freq: Every day | ORAL | Status: DC | PRN

## 2020-02-26 MED ORDER — SODIUM CHLORIDE 0.9 % IV BOLUS
1000.0000 mL | Freq: Once | INTRAVENOUS | Status: AC
  Administered 2020-02-26: 1000 mL via INTRAVENOUS

## 2020-02-26 MED ORDER — IPRATROPIUM-ALBUTEROL 0.5-2.5 (3) MG/3ML IN SOLN: 3.0000 mL | RESPIRATORY_TRACT | Status: DC | PRN

## 2020-02-26 NOTE — Progress Notes (Addendum)
CODE SEPSIS - PHARMACY COMMUNICATION  **Broad Spectrum Antibiotics should be administered within 1 hour of Sepsis diagnosis**  Time Code Sepsis Called/Page Received: 3/18 1519  Antibiotics Ordered: CTX, Azith  Time of 1st antibiotic administration: 1801  Additional action taken by pharmacy: Called RN re: abx- RN not aware that a sepsis code/consult was put in. RN currently off the floor with another patient. She will hang abx as soon as returns  If necessary, Name of Provider/Nurse Contacted: Ariel Mauri [place] ,PharmD Clinical Pharmacist  02/26/2020  4:19 PM

## 2020-02-26 NOTE — ED Provider Notes (Signed)
[facility] Emergency Department Provider Note  ____________________________________________   First MD Initiated Contact with Patient 02/26/20 1351     (approximate)  I have reviewed the triage vital signs and the nursing notes.   HISTORY  Chief Complaint Fever    HPI Cory Harvey is a 59 y.o. male  With h/o HTN, stroke, here with cough and SOB. Pt reports that over the past 24 hours, he's developed progressively worsening cough, SOB, and fatigue. He is currently in [employer] and per report, was noted to have a temp of 99.1 at his facility. On EMS arrival, pt tachypneic and was given two duonebs and 125 of solumedrol with improvement. He reports he feels better in terms of SOB. He does endorse subjective fever, chills, weakness. Mild nausea but no vomiting. No specific sick contacts. Per report, pt has neg COVID test today at facility.       Past Medical History:  Diagnosis Date  . Depression   . History of CVA (cerebrovascular accident)   . Hypertension   . Stroke (HCC)    x 4  . Tobacco abuse     Patient Active Problem List   Diagnosis Date Noted  . TIA (transient ischemic attack) 09/29/2018  . Arterial ischemic stroke, MCA (middle cerebral artery), right, acute (HCC) 08/09/2018  . Hyperlipidemia 05/29/2018  . Cocaine abuse (HCC) 05/29/2018  . Groin hematoma, right 05/29/2018  . Acute ischemic right MCA stroke [facility]) s/p neurointervention w/ R ICA stent placed 05/25/2018  . Middle cerebral artery stenosis, right 05/25/2018  . Cerebrovascular accident (CVA) involving right cerebral hemisphere (HCC) 03/29/2018  . CVA (cerebral vascular accident) (HCC) 03/26/2018  . Acute CVA (cerebrovascular accident) (HCC) 09/02/2017  . Gait disturbance 09/02/2017  . History of CVA (cerebrovascular accident) 04/18/2017  . Tobacco use 04/18/2017  . Depression   . Hypertension   . Stroke [facility])     Past Surgical History:  Procedure  Laterality Date  . IR CT HEAD LTD  05/25/2018  . IR INTRA CRAN STENT  05/25/2018  . kidney surgery Left 1983  . RADIOLOGY WITH ANESTHESIA N/A 05/25/2018   Procedure: RADIOLOGY WITH ANESTHESIA;  Surgeon: Julieanne Cotton, MD;  Location: [facility] OR;  Service: Radiology;  Laterality: N/A;  . TEE WITHOUT CARDIOVERSION N/A 03/27/2018   Procedure: TRANSESOPHAGEAL ECHOCARDIOGRAM (TEE);  Surgeon: Dalia Heading, MD;  Location: [facility] ORS;  Service: Cardiovascular;  Laterality: N/A;    Prior to Admission medications   Medication Sig Start Date End Date Taking? Authorizing Provider  aspirin 81 MG chewable tablet Chew 1 tablet (81 mg total) by mouth daily. 05/30/18  Yes Layne Benton, NP  atorvastatin (LIPITOR) 40 MG tablet Take 1 tablet (40 mg total) by mouth at bedtime. 08/16/18  Yes Altamese Dilling, MD  bisacodyl (DULCOLAX) 5 MG EC tablet Take 1 tablet (5 mg total) by mouth daily as needed for moderate constipation. 08/16/18  Yes Altamese Dilling, MD  divalproex (DEPAKOTE SPRINKLE) 125 MG capsule Take 125 mg by mouth 2 (two) times daily.   Yes [provider]  escitalopram (LEXAPRO) 10 MG tablet Take 10 mg by mouth daily.   Yes [provider]  ipratropium-albuterol (DUONEB) 0.5-2.5 (3) MG/3ML SOLN Inhale 3 mLs into the lungs every 4 (four) hours as needed for wheezing or shortness of breath. 12/17/19  Yes [provider]  losartan (COZAAR) 25 MG tablet Take 1 tablet (25 mg total) by mouth daily. 04/04/18  Yes Love, Evlyn Kanner, PA-C  senna-docusate (  SENOKOT-S) 8.6-50 MG tablet Take 1 tablet by mouth at bedtime as needed for mild constipation. 08/16/18  Yes Altamese Dilling, MD  ticagrelor (BRILINTA) 90 MG TABS tablet Take 1 tablet (90 mg total) by mouth 2 (two) times daily. 05/29/18  Yes Layne Benton, NP    Allergies Patient has no known allergies.  Family History  Problem Relation Age of Onset  . Diabetes Mother   . Heart disease Mother   . Hyperlipidemia Mother     . Hypertension Mother   . Stroke Mother   . Diabetes Father   . Hypertension Father   . Cancer Neg Hx     Social History Social History   Tobacco Use  . Smoking status: Current Every Day Smoker    Packs/day: 0.25    Types: Cigarettes  . Smokeless tobacco: Never Used  Substance Use Topics  . Alcohol use: Not Currently    Comment: occasional   . Drug use: No    Review of Systems  Review of Systems  Constitutional: Positive for chills and fatigue. Negative for fever.  HENT: Negative for sore throat.   Respiratory: Positive for cough and shortness of breath.   Cardiovascular: Negative for chest pain.  Gastrointestinal: Positive for nausea. Negative for abdominal pain.  Genitourinary: Negative for flank pain.  Musculoskeletal: Negative for neck pain.  Skin: Negative for rash and wound.  Allergic/Immunologic: Negative for immunocompromised state.  Neurological: Negative for weakness and numbness.  Hematological: Does not bruise/bleed easily.  All other systems reviewed and are negative.    ____________________________________________  PHYSICAL EXAM:      VITAL SIGNS: ED Triage Vitals  Enc Vitals Group     BP 02/26/20 1355 128/85     Pulse Rate 02/26/20 1355 (!) 102     Resp 02/26/20 1355 18     Temp --      Temp src --      SpO2 02/26/20 1355 98 %     Weight --      Height --      Head Circumference --      Peak Flow --      Pain Score 02/26/20 1358 0     Pain Loc --      Pain Edu? --      Excl. in GC? --      Physical Exam Vitals and nursing note reviewed.  Constitutional:      General: He is not in acute distress.    Appearance: He is well-developed.  HENT:     Head: Normocephalic and atraumatic.  Eyes:     Conjunctiva/sclera: Conjunctivae normal.  Cardiovascular:     Rate and Rhythm: Regular rhythm. Tachycardia present.     Heart sounds: Normal heart sounds. No murmur. No friction rub.  Pulmonary:     Effort: Pulmonary effort is normal.  Tachypnea present. No respiratory distress.     Breath sounds: Examination of the right-middle field reveals rales. Examination of the left-middle field reveals rales. Examination of the right-lower field reveals rales. Examination of the left-lower field reveals rales. Rales present. No wheezing.  Abdominal:     General: There is no distension.     Palpations: Abdomen is soft.     Tenderness: There is no abdominal tenderness.  Musculoskeletal:     Cervical back: Neck supple.  Skin:    General: Skin is warm.     Capillary Refill: Capillary refill takes less than 2 seconds.  Neurological:     Mental  Status: He is alert and oriented to person, place, and time.     Motor: No abnormal muscle tone.       ____________________________________________   LABS (all labs ordered are listed, but only abnormal results are displayed)  Labs Reviewed  COMPREHENSIVE METABOLIC PANEL - Abnormal; Notable for the following components:      Result Value   Glucose, Bld 167 (*)    All other components within normal limits  CBC WITH DIFFERENTIAL/PLATELET - Abnormal; Notable for the following components:   WBC 11.6 (*)    Platelets 527 (*)    Neutro Abs 9.7 (*)    All other components within normal limits  LACTIC ACID, PLASMA - Abnormal; Notable for the following components:   Lactic Acid, Venous 3.3 (*)    All other components within normal limits  RESPIRATORY PANEL BY RT PCR (FLU A&B, COVID)  CULTURE, BLOOD (ROUTINE X 2)  CULTURE, BLOOD (ROUTINE X 2)  BRAIN NATRIURETIC PEPTIDE  LACTIC ACID, PLASMA  PROTIME-INR  TROPONIN I (HIGH SENSITIVITY)  TROPONIN I (HIGH SENSITIVITY)    ____________________________________________  EKG: Normal sinus rhythm, VR 95. QRS 107, QTc 449. Non-specific ST changes likely reflect mild J point elevation, no ischemia or infarct. ________________________________________  RADIOLOGY All imaging, including plain films, CT scans, and ultrasounds, independently  reviewed by me, and interpretations confirmed via formal radiology reads.  ED MD interpretation:   CXR: Bibasilar infiltrate, likely PNA  Official radiology report(s): DG Chest Portable 1 View  Result Date: 02/26/2020 CLINICAL DATA:  Shortness of breath EXAM: PORTABLE CHEST 1 VIEW COMPARISON:  09/29/2018 FINDINGS: The heart size and mediastinal contours are within normal limits. Bibasilar interstitial prominence with subtle streaky left basilar opacity. No pleural effusion or pneumothorax. The visualized skeletal structures are unremarkable. IMPRESSION: Bibasilar interstitial prominence with subtle streaky left basilar opacity. Findings may reflect bronchitic type lung changes versus early infiltrate. Electronically Signed   By: Duanne Guess D.O.   On: 02/26/2020 14:25    ____________________________________________  PROCEDURES   Procedure(s) performed (including Critical Care):  .1-3 Lead EKG Interpretation Performed by: Cory Pollack, MD Authorized by: Cory Pollack, MD     Interpretation: normal     ECG rate assessment: normal     Rhythm: sinus rhythm     Ectopy: none     Conduction: normal   Comments:     Indication: Sepsis, SOB .Critical Care Performed by: Cory Pollack, MD Authorized by: Cory Pollack, MD   Critical care provider statement:    Critical care time (minutes):  35   Critical care time was exclusive of:  Separately billable procedures and treating other patients and teaching time   Critical care was necessary to treat or prevent imminent or life-threatening deterioration of the following conditions:  Circulatory failure, sepsis, respiratory failure and cardiac failure   Critical care was time spent personally by me on the following activities:  Development of treatment plan with patient or surrogate, discussions with consultants, evaluation of patient's response to treatment, examination of patient, obtaining history from patient or surrogate,  ordering and performing treatments and interventions, ordering and review of laboratory studies, ordering and review of radiographic studies, pulse oximetry, re-evaluation of patient's condition and review of old charts   I assumed direction of critical care for this patient from another provider in my specialty: no      ____________________________________________  INITIAL IMPRESSION / MDM / ASSESSMENT AND PLAN / ED COURSE  As part of my medical decision making, I reviewed the  following data within the [place] notes reviewed and incorporated, Old chart reviewed, Notes from prior ED visits, and [place] Controlled Substance Database       *Ahmon Tosi was evaluated in Emergency Department on 02/26/2020 for the symptoms described in the history of present illness. He was evaluated in the context of the global COVID-19 pandemic, which necessitated consideration that the patient might be at risk for infection with the SARS-CoV-2 virus that causes COVID-19. Institutional protocols and algorithms that pertain to the evaluation of patients at risk for COVID-19 are in a state of rapid change based on information released by regulatory bodies including the CDC and federal and state organizations. These policies and algorithms were followed during the patient's care in the ED.  Some ED evaluations and interventions may be delayed as a result of limited staffing during the pandemic.*     Medical Decision Making:  59 yo M here with cough, SOB, low-grade temperature. On arrival, pt mildly tachypneic, wheezing with mild hypotension. CXR shows bibasilar infiltrates. WBC mildly elevated, LA elevated at 3.3. Will active sepsis protocol, cover empirically with CAP with Rocephin/Azithro, IVF, and admission. Repeat COVID sent.  ____________________________________________  FINAL CLINICAL IMPRESSION(S) / ED DIAGNOSES  Final diagnoses:  Community acquired pneumonia, unspecified  laterality     MEDICATIONS GIVEN DURING THIS VISIT:  Medications  cefTRIAXone (ROCEPHIN) 2 g in sodium chloride 0.9 % 100 mL IVPB (has no administration in time range)  azithromycin (ZITHROMAX) 500 mg in sodium chloride 0.9 % 250 mL IVPB (has no administration in time range)  sodium chloride 0.9 % bolus 1,000 mL (has no administration in time range)  ipratropium-albuterol (DUONEB) 0.5-2.5 (3) MG/3ML nebulizer solution 3 mL (has no administration in time range)  sodium chloride 0.9 % bolus 1,000 mL (has no administration in time range)  lactated ringers bolus 500 mL (has no administration in time range)     ED Discharge Orders    None       Note:  This document was prepared using Dragon voice recognition software and may include unintentional dictation errors.   Duffy Bruce, MD 02/26/20 1538

## 2020-02-26 NOTE — ED Triage Notes (Signed)
Pt arrives via EMS from [facility] for [facility] and fever- pt was noted to have difficulty breathing about 3 hours ago and a fever of 99.1- facility gave 625mg  of tylenol- pt had 2 albuterol treatments by EMS- HR noted to be 100-110- pt had a negative covid test at the facility 2 hours ago- pt also given 125 of solumedrol

## 2020-02-26 NOTE — ED Notes (Signed)
PT IV in right arm infiltrated. Redness and swelling noted at site under right elbow. IV removed.  Will continue to monitor.

## 2020-02-26 NOTE — H&P (Signed)
History and Physical:    Cory Harvey   UVO:[id number] DOB: 10/27/1961 DOA: 02/26/2020  Referring MD/provider: Shaune Pollack, MD PCP: Marisue Ivan, MD   Patient coming from: [place] [facility]  Chief Complaint: Shortness of breath  History of Present Illness:   Cory Harvey is an 59 y.o. male with medical history significant for depression, stroke with residual left-sided weakness and slurred speech, hypertension, tobacco abuse, was brought from the nursing home to the emergency room because of shortness of breath.  Patient is unable to provide any history.  When asked why he came to the emergency room, he responded "I have pneumonia".  History was obtained from chart review and ED physician, Dr. Erma Heritage.  Reportedly, patient had a temperature of 99.1 at the nursing home and had complained of shortness of breath.  When EMS arrived, her oxygen saturation was 89% on room air.  He was given albuterol and Solu-Medrol at the nursing home.  ED Course:  The patient had a temperature of 97.9, pulse of 103, BP 115/91 and oxygen saturation of 95% on room air.  Lactic acid was elevated at 3.3.  Chest x-ray showed bibasilar interstitial prominence with subtle streaky left basilar opacity and findings were concerning for pneumonia.  IV fluids, IV Rocephin and azithromycin were ordered in the emergency room.  ROS:   ROS patient unable to provide accurate information  Past Medical History:   Past Medical History:  Diagnosis Date  . Depression   . History of CVA (cerebrovascular accident)   . Hypertension   . Stroke (HCC)    x 4  . Tobacco abuse     Past Surgical History:   Past Surgical History:  Procedure Laterality Date  . IR CT HEAD LTD  05/25/2018  . IR INTRA CRAN STENT  05/25/2018  . kidney surgery Left 1983  . RADIOLOGY WITH ANESTHESIA N/A 05/25/2018   Procedure: RADIOLOGY WITH ANESTHESIA;  Surgeon: Julieanne Cotton, MD;  Location: [facility] OR;  Service: Radiology;   Laterality: N/A;  . TEE WITHOUT CARDIOVERSION N/A 03/27/2018   Procedure: TRANSESOPHAGEAL ECHOCARDIOGRAM (TEE);  Surgeon: Dalia Heading, MD;  Location: [facility] ORS;  Service: Cardiovascular;  Laterality: N/A;    Social History:   Social History   Socioeconomic History  . Marital status: Single    Spouse name: Not on file  . Number of children: Not on file  . Years of education: Not on file  . Highest education level: Not on file  Occupational History  . Not on file  Tobacco Use  . Smoking status: Current Every Day Smoker    Packs/day: 0.25    Types: Cigarettes  . Smokeless tobacco: Never Used  Substance and Sexual Activity  . Alcohol use: Not Currently    Comment: occasional   . Drug use: No  . Sexual activity: Not Currently  Other Topics Concern  . Not on file  Social History Narrative  . Not on file   Social Determinants of Health   Financial Resource Strain:   . Difficulty of Paying Living Expenses:   Food Insecurity:   . Worried About Programme researcher, broadcasting/film/video in the Last Year:   . Barista in the Last Year:   Transportation Needs:   . Freight forwarder (Medical):   Marland Kitchen Lack of Transportation (Non-Medical):   Physical Activity:   . Days of Exercise per Week:   . Minutes of Exercise per Session:   Stress:   . Feeling of  Stress :   Social Connections:   . Frequency of Communication with Friends and Family:   . Frequency of Social Gatherings with Friends and Family:   . Attends Religious Services:   . Active Member of Clubs or Organizations:   . Attends Banker Meetings:   Marland Kitchen Marital Status:   Intimate Partner Violence:   . Fear of Current or Ex-Partner:   . Emotionally Abused:   Marland Kitchen Physically Abused:   . Sexually Abused:     Allergies   Patient has no known allergies.  Family history:   Family History  Problem Relation Age of Onset  . Diabetes Mother   . Heart disease Mother   . Hyperlipidemia Mother   . Hypertension Mother   .  Stroke Mother   . Diabetes Father   . Hypertension Father   . Cancer Neg Hx     Current Medications:   Prior to Admission medications   Medication Sig Start Date End Date Taking? Authorizing Provider  aspirin 81 MG chewable tablet Chew 1 tablet (81 mg total) by mouth daily. 05/30/18  Yes Layne Benton, NP  atorvastatin (LIPITOR) 40 MG tablet Take 1 tablet (40 mg total) by mouth at bedtime. 08/16/18  Yes Altamese Dilling, MD  bisacodyl (DULCOLAX) 5 MG EC tablet Take 1 tablet (5 mg total) by mouth daily as needed for moderate constipation. 08/16/18  Yes Altamese Dilling, MD  divalproex (DEPAKOTE SPRINKLE) 125 MG capsule Take 125 mg by mouth 2 (two) times daily.   Yes [provider]  escitalopram (LEXAPRO) 10 MG tablet Take 10 mg by mouth daily.   Yes [provider]  ipratropium-albuterol (DUONEB) 0.5-2.5 (3) MG/3ML SOLN Inhale 3 mLs into the lungs every 4 (four) hours as needed for wheezing or shortness of breath. 12/17/19  Yes [provider]  losartan (COZAAR) 25 MG tablet Take 1 tablet (25 mg total) by mouth daily. 04/04/18  Yes Love, Evlyn Kanner, PA-C  senna-docusate (SENOKOT-S) 8.6-50 MG tablet Take 1 tablet by mouth at bedtime as needed for mild constipation. 08/16/18  Yes Altamese Dilling, MD  ticagrelor (BRILINTA) 90 MG TABS tablet Take 1 tablet (90 mg total) by mouth 2 (two) times daily. 05/29/18  Yes Layne Benton, NP    Physical Exam:   Vitals:   02/26/20 1400 02/26/20 1401 02/26/20 1430 02/26/20 1500  BP: (!) 115/91   101/69  Pulse: (!) 103  94 90  Resp:   17 20  Temp:  97.9 F (36.6 C)    TempSrc:  Oral    SpO2: 95%  94% 95%     Physical Exam: Blood pressure 101/69, pulse 90, temperature 97.9 F (36.6 C), temperature source Oral, resp. rate 20, SpO2 95 %. Gen: No acute distress. Head: Normocephalic, atraumatic. Eyes: Pupils equal, round and reactive to light. Extraocular movements intact.  Sclerae nonicteric.  Mouth: Moist mucous  membranes Neck: Supple, no thyromegaly, no lymphadenopathy, no jugular venous distention. Chest: Lungs are clear to auscultation with good air movement. No rales, rhonchi or wheezes.  CV: Heart sounds are regular with an S1, S2. No murmurs, rubs or gallops.  Abdomen: Soft, nontender, nondistended with normal active bowel sounds. No palpable masses. Extremities: Extremities are without clubbing, or cyanosis. No edema. Pedal pulses 2+. Skin: Warm and dry. No rashes Neuro: Alert and oriented times 2 (person and place); right speech, left facial palsy, left upper extremity weakness with contracture at the elbow and wrist joints Psych: Insight is good and  judgment is appropriate. Mood and affect normal.   Data Review:    Labs: Basic Metabolic Panel: Recent Labs  Lab 02/26/20 1408  NA 137  K 3.5  CL 102  CO2 25  GLUCOSE 167*  BUN 9  CREATININE 0.97  CALCIUM 9.1   Liver Function Tests: Recent Labs  Lab 02/26/20 1408  AST 19  ALT 17  ALKPHOS 71  BILITOT 0.7  PROT 6.6  ALBUMIN 3.7   No results for input(s): LIPASE, AMYLASE in the last 168 hours. No results for input(s): AMMONIA in the last 168 hours. CBC: Recent Labs  Lab 02/26/20 1408  WBC 11.6*  NEUTROABS 9.7*  HGB 14.3  HCT 45.0  MCV 85.1  PLT 527*   Cardiac Enzymes: No results for input(s): CKTOTAL, CKMB, CKMBINDEX, TROPONINI in the last 168 hours.  BNP (last 3 results) No results for input(s): PROBNP in the last 8760 hours. CBG: No results for input(s): GLUCAP in the last 168 hours.  Urinalysis    Component Value Date/Time   COLORURINE YELLOW (A) 09/30/2018 1553   APPEARANCEUR CLEAR (A) 09/30/2018 1553   APPEARANCEUR Clear 03/09/2014 1955   LABSPEC 1.030 09/30/2018 1553   LABSPEC 1.013 03/09/2014 1955   PHURINE 6.0 09/30/2018 1553   GLUCOSEU NEGATIVE 09/30/2018 1553   GLUCOSEU Negative 03/09/2014 1955   HGBUR NEGATIVE 09/30/2018 1553   BILIRUBINUR NEGATIVE 09/30/2018 1553   BILIRUBINUR Negative  03/09/2014 1955   KETONESUR NEGATIVE 09/30/2018 1553   PROTEINUR NEGATIVE 09/30/2018 1553   NITRITE NEGATIVE 09/30/2018 1553   LEUKOCYTESUR NEGATIVE 09/30/2018 1553   LEUKOCYTESUR Negative 03/09/2014 1955      Radiographic Studies: DG Chest Portable 1 View  Result Date: 02/26/2020 CLINICAL DATA:  Shortness of breath EXAM: PORTABLE CHEST 1 VIEW COMPARISON:  09/29/2018 FINDINGS: The heart size and mediastinal contours are within normal limits. Bibasilar interstitial prominence with subtle streaky left basilar opacity. No pleural effusion or pneumothorax. The visualized skeletal structures are unremarkable. IMPRESSION: Bibasilar interstitial prominence with subtle streaky left basilar opacity. Findings may reflect bronchitic type lung changes versus early infiltrate. Electronically Signed   By: Duanne Guess D.O.   On: 02/26/2020 14:25    EKG: Independently reviewed.  Normal sinus rhythm, LAFB   Assessment/Plan:   Active Problems:   Pneumonia  Pneumonia/shortness of breath/elevated lactic acid: Admit to MedSurg.  Monitor on telemetry.  Treat with empiric IV antibiotics for suspected pneumonia.  Obtain CT of the chest for further evaluation.  Dysphagia 1 diet for now.  Consult speech therapist for swallow evaluation.  Acute hypoxemic respiratory failure: Improved.  Patient is on room air at this time.  History of stroke: Continue Brilinta.  Depression: Continue psychotropics  There is no height or weight on file to calculate BMI.  Other information:   DVT prophylaxis: Lovenox Code Status: Full code. Family Communication: Plan discussed with patient Disposition Plan: Possible discharge to SNF in 1 to 2 days depending on clinical improvement Consults called: None Admission status: Observation  The medical decision making on this patient was of high complexity and the patient is at high risk for clinical deterioration, therefore this is a level 3 visit.    Time spent 70  minutes  Jerline Linzy [facility]   How to contact the [facility] Attending or Consulting provider 7A - 7P or covering provider during after hours 7P -7A, for this patient?   1. Check the care team in [facility] and look for a) attending/consulting [facility] provider listed and b) the [facility] team  listed 2. Log into [email or web address] and use [facility] universal password to access. If you do not have the password, please contact the hospital operator. 3. Locate the [facility] provider you are looking for under [facility] and page to a number that you can be directly reached. 4. If you still have difficulty reaching the provider, please page the [facility] (Director on Call) for the Hospitalists listed on amion for assistance.  02/26/2020, 4:38 PM

## 2020-02-26 NOTE — ED Notes (Signed)
Pt resting comfortably with eyes closed. Pt calm and cooperative at this time without complaints. Pt awaiting bed assignment.

## 2020-02-26 NOTE — Sepsis Progress Note (Signed)
Notified bedside nurse of need to draw blood cultures asap and then administer antibiotics.

## 2020-02-27 DIAGNOSIS — Z8673 Personal history of transient ischemic attack (TIA), and cerebral infarction without residual deficits: Secondary | ICD-10-CM | POA: Diagnosis not present

## 2020-02-27 DIAGNOSIS — R0602 Shortness of breath: Secondary | ICD-10-CM

## 2020-02-27 LAB — BASIC METABOLIC PANEL
Anion gap: 5 (ref 5–15)
BUN: 10 mg/dL (ref 6–20)
CO2: 26 mmol/L (ref 22–32)
Calcium: 8.9 mg/dL (ref 8.9–10.3)
Chloride: 110 mmol/L (ref 98–111)
Creatinine, Ser: 0.76 mg/dL (ref 0.61–1.24)
GFR calc Af Amer: >60 mL/min (ref >60)
GFR calc non Af Amer: >60 mL/min (ref >60)
Glucose, Bld: 118 mg/dL — ABNORMAL HIGH (ref 70–99)
Potassium: 4.4 mmol/L (ref 3.5–5.1)
Sodium: 141 mmol/L (ref 135–145)

## 2020-02-27 LAB — CBC
HCT: 42.3 % (ref 39.0–52.0)
Hemoglobin: 13.7 g/dL (ref 13.0–17.0)
MCH: 27 pg (ref 26.0–34.0)
MCHC: 32.4 g/dL (ref 30.0–36.0)
MCV: 83.4 fL (ref 80.0–100.0)
Platelets: 525 10*3/uL — ABNORMAL HIGH (ref 150–400)
RBC: 5.07 MIL/uL (ref 4.22–5.81)
RDW: 14.8 % (ref 11.5–15.5)
WBC: 10.7 10*3/uL — ABNORMAL HIGH (ref 4.0–10.5)
nRBC: 0 % (ref 0.0–0.2)

## 2020-02-27 LAB — HIV ANTIBODY (ROUTINE TESTING W REFLEX): HIV Screen 4th Generation wRfx: NONREACTIVE

## 2020-02-27 NOTE — ED Notes (Signed)
Dr. Myriam Forehand contacted for discharge instructions.

## 2020-02-27 NOTE — Evaluation (Signed)
Clinical/Bedside Swallow Evaluation Patient Details  Name: Cory Harvey MRN: [id number] Date of Birth: 03/31/1961  Today's Date: 02/27/2020 Time: SLP Start Time (ACUTE ONLY): 1040 SLP Stop Time (ACUTE ONLY): 1130 SLP Time Calculation (min) (ACUTE ONLY): 50 min  Past Medical History:  Past Medical History:  Diagnosis Date  . Depression   . History of CVA (cerebrovascular accident)   . Hypertension   . Stroke (HCC)    x 4  . Tobacco abuse    Past Surgical History:  Past Surgical History:  Procedure Laterality Date  . IR CT HEAD LTD  05/25/2018  . IR INTRA CRAN STENT  05/25/2018  . kidney surgery Left 1983  . RADIOLOGY WITH ANESTHESIA N/A 05/25/2018   Procedure: RADIOLOGY WITH ANESTHESIA;  Surgeon: Julieanne Cotton, MD;  Location: [facility] OR;  Service: Radiology;  Laterality: N/A;  . TEE WITHOUT CARDIOVERSION N/A 03/27/2018   Procedure: TRANSESOPHAGEAL ECHOCARDIOGRAM (TEE);  Surgeon: Dalia Heading, MD;  Location: [facility] ORS;  Service: Cardiovascular;  Laterality: N/A;   HPI:  Pt is an 59 y.o. male with medical history significant for depression, stroke x4 with residual left-sided weakness, hypertension, tobacco abuse, was brought from the nursing home to the emergency room because of shortness of breath, cough, and fatigue. He is currently in [employer]; negative Covid test at facility. Patient is unable to provide any history.  CHest CT Angio: "Negative for acute pulmonary embolus or aortic dissection; No focal airspace disease".    Assessment / Plan / Recommendation Clinical Impression  Pt appears to present w/ grossly adequate oropharyngeal phase swallow function w/ reduced risk for aspiration when following general aspiration precautions. Pt appears to have a baseline of potential food pocketing in his left cheek d/t prior CVAs/weakness per previous assessment in 2019 when he was seen by skilled ST services for language/swallowing evaluations. Pt was recommended a Regular  diet w/ thin liquids at that time w/ precautions. Oral mechanism exam slight decreased facial/labial decreased tone but no anterior spillage noted to occur during po trials. Lingual strength and ROM was [facility]. Cough strong. During the oral phase, there was slight-min increased oral phase time to fully attend to/address mastication of increased textured food trials; this increased oral phase time slightly. ALso educated and instructed pt to use the lingual sweeping to clear any oral residue especially in Left cheek. No other oral phase deficits were noted. No overt clinical s/s of aspiration were noted; vocal quality clear, no decline in respiratory status noted during/post po trials. O2 sats remained in upper 90's during entire session/po intake. Of note, an extremely delayed cough response was seen well after completion of po's and this SLP had left the room -- question whether or not related to Reflux? Recommend f/u w/ GI to r/o Reflux d/t belching and such a delayed cough response noted post all po's given.  Recommend a mech soft diet with thin liquids; monitor straw use. Aspiration precautions including oral clearing using a lingual sweep as needed. General Reflux precautions. Pills given in Puree for safer, easier swallowing overall. NSG consulted post evaluation on results and recommendations.  SLP Visit Diagnosis: Dysphagia, oral phase (R13.11)(slight-min)    Aspiration Risk  (reduced following precautions)    Diet Recommendation  Mech Soft diet w/ Thin liquids; general aspiration and Reflux precautions. Monitoring during meals.  Medication Administration: Whole meds with puree(for safer swallowing in general)    Other  Recommendations Recommended Consults: Consider GI evaluation(Dietician f/u for support as needed) Oral Care Recommendations: Oral  care BID;Oral care before and after PO;Staff/trained caregiver to provide oral care(Denture care) Other Recommendations: (n/a)   Follow up  Recommendations None      Frequency and Duration (n/a)  (n/a)       Prognosis Prognosis for Safe Diet Advancement: Good Barriers to Reach Goals: Cognitive deficits;Time post onset;Severity of deficits Barriers/Prognosis Comment: would f/u w/ further assessment for Reflux and need for treatment      Swallow Study   General Date of Onset: 02/26/20 HPI: Pt is an 59 y.o. male with medical history significant for depression, stroke x4 with residual left-sided weakness, hypertension, tobacco abuse, was brought from the nursing home to the emergency room because of shortness of breath, cough, and fatigue. He is currently in [employer]; negative Covid test at facility. Patient is unable to provide any history.  CHest CT Angio: "Negative for acute pulmonary embolus or aortic dissection; No focal airspace disease".  Type of Study: Bedside Swallow Evaluation Previous Swallow Assessment: 05/2018 - regular diet rec'd then; oral sweeping to clear Diet Prior to this Study: Regular;Thin liquids Temperature Spikes Noted: No(wbc 10.7 declining) Respiratory Status: Room air History of Recent Intubation: No Behavior/Cognition: Pleasant mood;Cooperative;Alert;Distractible;Requires cueing Oral Cavity Assessment: Within Functional Limits Oral Care Completed by SLP: Yes Oral Cavity - Dentition: Dentures, top;Dentures, bottom(broken tooth on denture) Vision: Functional for self-feeding Self-Feeding Abilities: Able to feed self;Needs assist;Needs set up(monitoring) Patient Positioning: Upright in bed(needed cues to positiong upright) Baseline Vocal Quality: (adequate; min quick/muttered speech) Volitional Cough: Strong Volitional Swallow: Able to elicit    Oral/Motor/Sensory Function Overall Oral Motor/Sensory Function: Within functional limits   Ice Chips Ice chips: Within functional limits Presentation: Spoon(2 trials)   Thin Liquid Thin Liquid: Within functional limits Presentation: Cup;Self  Fed;Straw Other Comments: no immediate, overt s/s of aspiration noted w/ oral intake at this eval    Nectar Thick Nectar Thick Liquid: Not tested   Honey Thick Honey Thick Liquid: Not tested   Puree Puree: Within functional limits Presentation: Spoon;Self Fed(4 ozs)   Solid     Solid: Impaired(min) Presentation: Self Fed;Spoon(8 trials) Oral Phase Impairments: Impaired mastication(min increased time for full mastication/clearing) Oral Phase Functional Implications: Impaired mastication(min) Pharyngeal Phase Impairments: (none) Other Comments: pt instructed to check for oral clearing especially on Left side/cheek       Orinda Kenner, MS, CCC-SLP Noeh Sparacino 02/27/2020,12:48 PM

## 2020-02-27 NOTE — Discharge Summary (Signed)
Physician Discharge Summary  Cory Harvey LNL:[id number] DOB: Jun 10, 1961 DOA: 02/26/2020  PCP: Dion Body, MD  Admit date: 02/26/2020 Discharge date: 02/27/2020  Discharge disposition: Skilled nursing facility   Recommendations for Outpatient Follow-Up:   Follow-up with physician at the nursing home within 3 days of discharge   Discharge Diagnosis:   Principal Problem:   Shortness of breath Active Problems:   History of CVA (cerebrovascular accident)    Discharge Condition: Stable.  Diet recommendation: Mechanical soft diet  Code status: Full code    Hospital Course:   Cory Harvey is an 59 y.o. male with medical history significant for depression, stroke with residual left-sided weakness and slurred speech, hypertension, tobacco abuse, was brought from the nursing home to the emergency room because of shortness of breath.  Patient is unable to provide any history.  When asked why he came to the emergency room, he responded "I have pneumonia".  History was obtained from chart review and ED physician, Dr. Ellender Hose.  Reportedly, patient had a temperature of 99.1 at the nursing home and had complained of shortness of breath.  When EMS arrived, his oxygen saturation was 89% on room air.  He was given albuterol and Solu-Medrol at the nursing home.  The patient had a temperature of 97.9, pulse of 103, BP 115/91 and oxygen saturation of 95% on room air.  Lactic acid was elevated at 3.3.  Chest x-ray showed bibasilar interstitial prominence with subtle streaky left basilar opacity and findings were concerning for pneumonia.  He was given IV fluids, IV Rocephin and azithromycin in the emergency room. However, CTA of the chest that was ordered later did not show any evidence of acute abnormality such as pulmonary embolism or pneumonia.  The etiology of his shortness of breath and mild hypoxemia is not clear but it is suspected that maybe he had aspirated at the nursing  home.  He was evaluated by the speech therapist who recommended mechanical soft diet for dysphagia.  He had lactic acidosis which has resolved.  Acute hypoxemic respiratory failure has resolved as well and he is tolerating room air.  He is asymptomatic and he is deemed stable for discharge to the nursing home today.     Discharge Exam:   Vitals:   02/27/20 1200 02/27/20 1300  BP: 120/81 110/80  Pulse: 83 78  Resp: 20 18  Temp:    SpO2: 97% 98%   Vitals:   02/27/20 0601 02/27/20 1100 02/27/20 1200 02/27/20 1300  BP: 119/73 122/88 120/81 110/80  Pulse: 80 75 83 78  Resp: 17 20 20 18   Temp:      TempSrc:      SpO2: 97% 97% 97% 98%     GEN: NAD SKIN: No rash EYES: EOMI ENT: MMM CV: RRR PULM: CTA B ABD: soft, ND, NT, +BS CNS: AAO x 2 (person and place), slurred speech, left upper extremity weakness and left facial droop at baseline EXT: No edema or tenderness   The results of significant diagnostics from this hospitalization (including imaging, microbiology, ancillary and laboratory) are listed below for reference.     Procedures and Diagnostic Studies:   CT ANGIO CHEST PE W OR WO CONTRAST  Result Date: 02/26/2020 CLINICAL DATA:  Shortness of breath and cough EXAM: CT ANGIOGRAPHY CHEST WITH CONTRAST TECHNIQUE: Multidetector CT imaging of the chest was performed using the standard protocol during bolus administration of intravenous contrast. Multiplanar CT image reconstructions and MIPs were obtained to evaluate the vascular anatomy.  CONTRAST:  30mL OMNIPAQUE IOHEXOL 350 MG/ML SOLN COMPARISON:  Chest x-ray 02/26/2020, CT chest 09/20/2014 FINDINGS: Cardiovascular: Satisfactory opacification of the pulmonary arteries to the segmental level. No evidence of pulmonary embolism. Normal heart size. No pericardial effusion. Nonaneurysmal aorta. No dissection is seen. Mild aortic atherosclerosis. Mediastinum/Nodes: No enlarged mediastinal, hilar, or axillary lymph nodes. Thyroid gland,  trachea, and esophagus demonstrate no significant findings. Lungs/Pleura: Lungs are clear. No pleural effusion or pneumothorax. Minimal atelectasis at the right base. Upper Abdomen: Cyst in the upper pole of the right kidney. No acute abnormality Musculoskeletal: No chest wall abnormality. No acute or significant osseous findings. Degenerative changes of the spine. Review of the MIP images confirms the above findings. IMPRESSION: 1. Negative for acute pulmonary embolus or aortic dissection. 2. No focal airspace disease Aortic Atherosclerosis (ICD10-I70.0). Electronically Signed   By: Jasmine Pang M.D.   On: 02/26/2020 18:47   DG Chest Portable 1 View  Result Date: 02/26/2020 CLINICAL DATA:  Shortness of breath EXAM: PORTABLE CHEST 1 VIEW COMPARISON:  09/29/2018 FINDINGS: The heart size and mediastinal contours are within normal limits. Bibasilar interstitial prominence with subtle streaky left basilar opacity. No pleural effusion or pneumothorax. The visualized skeletal structures are unremarkable. IMPRESSION: Bibasilar interstitial prominence with subtle streaky left basilar opacity. Findings may reflect bronchitic type lung changes versus early infiltrate. Electronically Signed   By: Duanne Guess D.O.   On: 02/26/2020 14:25     Labs:   Basic Metabolic Panel: Recent Labs  Lab 02/26/20 1408 02/27/20 0448  NA 137 141  K 3.5 4.4  CL 102 110  CO2 25 26  GLUCOSE 167* 118*  BUN 9 10  CREATININE 0.97 0.76  CALCIUM 9.1 8.9   GFR CrCl cannot be calculated (Unknown ideal weight.). Liver Function Tests: Recent Labs  Lab 02/26/20 1408  AST 19  ALT 17  ALKPHOS 71  BILITOT 0.7  PROT 6.6  ALBUMIN 3.7   No results for input(s): LIPASE, AMYLASE in the last 168 hours. No results for input(s): AMMONIA in the last 168 hours. Coagulation profile Recent Labs  Lab 02/26/20 1752  INR 1.1    CBC: Recent Labs  Lab 02/26/20 1408 02/27/20 0448  WBC 11.6* 10.7*  NEUTROABS 9.7*  --   HGB  14.3 13.7  HCT 45.0 42.3  MCV 85.1 83.4  PLT 527* 525*   Cardiac Enzymes: No results for input(s): CKTOTAL, CKMB, CKMBINDEX, TROPONINI in the last 168 hours. BNP: Invalid input(s): POCBNP CBG: No results for input(s): GLUCAP in the last 168 hours. D-Dimer No results for input(s): DDIMER in the last 72 hours. Hgb A1c No results for input(s): HGBA1C in the last 72 hours. Lipid Profile No results for input(s): CHOL, HDL, LDLCALC, TRIG, CHOLHDL, LDLDIRECT in the last 72 hours. Thyroid function studies No results for input(s): TSH, T4TOTAL, T3FREE, THYROIDAB in the last 72 hours.  Invalid input(s): FREET3 Anemia work up No results for input(s): VITAMINB12, FOLATE, FERRITIN, TIBC, IRON, RETICCTPCT in the last 72 hours. Microbiology Recent Results (from the past 240 hour(s))  Blood Culture (routine x 2)     Status: None (Preliminary result)   Collection Time: 02/26/20  5:25 PM   Specimen: BLOOD  Result Value Ref Range Status   Specimen Description BLOOD BLOOD RIGHT WRIST  Final   Special Requests   Final    BOTTLES DRAWN AEROBIC AND ANAEROBIC Blood Culture results may not be optimal due to an inadequate volume of blood received in culture bottles   Culture  Final    NO GROWTH < 24 HOURS Performed at [facility], [address] [place], [place] [address]    Report Status PENDING  Incomplete  Blood Culture (routine x 2)     Status: None (Preliminary result)   Collection Time: 02/26/20  5:25 PM   Specimen: BLOOD  Result Value Ref Range Status   Specimen Description BLOOD BLOOD RIGHT FOREARM  Final   Special Requests   Final    BOTTLES DRAWN AEROBIC AND ANAEROBIC Blood Culture results may not be optimal due to an inadequate volume of blood received in culture bottles   Culture   Final    NO GROWTH < 24 HOURS Performed at [facility], [address] [place], [place] [address]    Report Status PENDING  Incomplete  Respiratory Panel by RT PCR (Flu A&B,  Covid) - Nasopharyngeal Swab     Status: None   Collection Time: 02/26/20  5:26 PM   Specimen: Nasopharyngeal Swab  Result Value Ref Range Status   SARS Coronavirus 2 by RT PCR NEGATIVE NEGATIVE Final    Comment: (NOTE) SARS-CoV-2 target nucleic acids are NOT DETECTED. The SARS-CoV-2 RNA is generally detectable in upper respiratoy specimens during the acute phase of infection. The lowest concentration of SARS-CoV-2 viral copies this assay can detect is 131 copies/mL. A negative result does not preclude SARS-Cov-2 infection and should not be used as the sole basis for treatment or other patient management decisions. A negative result may occur with  improper specimen collection/handling, submission of specimen other than nasopharyngeal swab, presence of viral mutation(s) within the areas targeted by this assay, and inadequate number of viral copies (<131 copies/mL). A negative result must be combined with clinical observations, patient history, and epidemiological information. The expected result is Negative. Fact Sheet for Patients:  https://www.moore.com/ Fact Sheet for Healthcare Providers:  https://www.young.biz/ This test is not yet ap proved or cleared by the [place] FDA and  has been authorized for detection and/or diagnosis of SARS-CoV-2 by FDA under an Emergency Use Authorization (EUA). This EUA will remain  in effect (meaning this test can be used) for the duration of the COVID-19 declaration under Section 564(b)(1) of the Act, 21 U.S.C. section 360bbb-3(b)(1), unless the authorization is terminated or revoked sooner.    Influenza A by PCR NEGATIVE NEGATIVE Final   Influenza B by PCR NEGATIVE NEGATIVE Final    Comment: (NOTE) The Xpert Xpress SARS-CoV-2/FLU/RSV assay is intended as an aid in  the diagnosis of influenza from Nasopharyngeal swab specimens and  should not be used as a sole basis for treatment. Nasal washings and    aspirates are unacceptable for Xpert Xpress SARS-CoV-2/FLU/RSV  testing. Fact Sheet for Patients: https://www.moore.com/ Fact Sheet for Healthcare Providers: https://www.young.biz/ This test is not yet approved or cleared by the [place] FDA and  has been authorized for detection and/or diagnosis of SARS-CoV-2 by  FDA under an Emergency Use Authorization (EUA). This EUA will remain  in effect (meaning this test can be used) for the duration of the  Covid-19 declaration under Section 564(b)(1) of the Act, 21  U.S.C. section 360bbb-3(b)(1), unless the authorization is  terminated or revoked. Performed at [facility], [address] [place], [place] [address]      Discharge Instructions:   Discharge Instructions    Activity as tolerated - No restrictions   Complete by: As directed    Fall precautions   DIET DYS 3   Complete by: As  directed    Fluid consistency: Thin   Discharge instructions   Complete by: As directed    Follow-up with physician at the nursing home within 3 days of discharge     Allergies as of 02/27/2020   No Known Allergies     Medication List    TAKE these medications   aspirin 81 MG chewable tablet Chew 1 tablet (81 mg total) by mouth daily.   atorvastatin 40 MG tablet Commonly known as: LIPITOR Take 1 tablet (40 mg total) by mouth at bedtime.   bisacodyl 5 MG EC tablet Commonly known as: DULCOLAX Take 1 tablet (5 mg total) by mouth daily as needed for moderate constipation.   divalproex 125 MG capsule Commonly known as: DEPAKOTE SPRINKLE Take 125 mg by mouth 2 (two) times daily.   escitalopram 10 MG tablet Commonly known as: LEXAPRO Take 10 mg by mouth daily.   ipratropium-albuterol 0.5-2.5 (3) MG/3ML Soln Commonly known as: DUONEB Inhale 3 mLs into the lungs every 4 (four) hours as needed for wheezing or shortness of breath.   losartan 25 MG tablet Commonly known as: COZAAR Take  1 tablet (25 mg total) by mouth daily.   senna-docusate 8.6-50 MG tablet Commonly known as: Senokot-S Take 1 tablet by mouth at bedtime as needed for mild constipation.   ticagrelor 90 MG Tabs tablet Commonly known as: BRILINTA Take 1 tablet (90 mg total) by mouth 2 (two) times daily.         Time coordinating discharge: 28 minutes  Signed:  Grace Haggart  [facility] 02/27/2020, 2:17 PM

## 2020-02-27 NOTE — ED Notes (Signed)
Pt incontinent of stool and urine, peri care done. Clean depends in place, linen and gown changed. Pt was calm and cooperative without complaints.

## 2020-02-27 NOTE — ED Notes (Signed)
Pt sleeping on stretcher, and appears comfortable. Respirations are equal and unlabored, no signs of acute distress.

## 2020-02-27 NOTE — ED Notes (Signed)
EDT Cory Harvey as sitter until pt is discharged

## 2020-02-27 NOTE — Care Management (Signed)
TOC RN CM: Spoke with Kelly @ [facility] confirmed patient is returning to SNF and SNF does not need Fl2 for patient to return. RN CM will notify nurse.

## 2020-03-02 LAB — CULTURE, BLOOD (ROUTINE X 2)
Culture: NO GROWTH
Culture: NO GROWTH

## 2020-03-10 ENCOUNTER — Non-Acute Institutional Stay: Payer: Medicare Other | Admitting: Nurse Practitioner

## 2020-03-10 ENCOUNTER — Other Ambulatory Visit: Payer: Self-pay

## 2020-03-10 ENCOUNTER — Encounter: Payer: Self-pay | Admitting: Nurse Practitioner

## 2020-03-10 VITALS — BP 122/89 | HR 78 | Temp 97.9°F | Resp 20 | Wt 157.1 lb

## 2020-03-10 DIAGNOSIS — I639 Cerebral infarction, unspecified: Secondary | ICD-10-CM

## 2020-03-10 DIAGNOSIS — Z515 Encounter for palliative care: Secondary | ICD-10-CM

## 2020-03-10 NOTE — Progress Notes (Signed)
Therapist, nutritional Palliative Care Consult Note Telephone: [phone]  Fax: [phone]  PATIENT NAME: Cory Harvey DOB: 1961-07-31 MRN: [id number]  PRIMARY CARE PROVIDER:   Marisue Ivan, MD  REFERRING PROVIDER: Dr Hodges/[facility] RESPONSIBLE PARTY:   Waymon Budge mother [phone] I was asked by Dr Yetta Flock to see Cory Harvey for Palliative care consult for goals of care  I spent 65 minutes providing this consultation,  from 9:30am to 10:35am. More than 50% of the time in this consultation was spent coordinating communication.   Recommendations 1. ACP: changed from full code to DNR, placed in Vynca/Epic; will mail blank MOST form with Hard Choice book to Cory Harvey for review and to revisit at next [facility] visit, sooner if wishes to complete  2. Palliative Care encounter: Palliative medicine team will continue to support patient, patient's family, and medical team. Visit consisted of counseling and education dealing with the complex and emotionally intense issues of symptom management and palliative care in the setting of serious and potentially life-treating illness.  HISTORY OF PRESENT ILLNESS:  Cory Harvey is a 59 y.o. year old male with multiple medical problems including CVA, right middle cerebral artery, hemiplegic, TIA, seizure disorder, speech disturbance, dysphasia, dysarthria, HTN, HLD, depression, h/o falling, h/o cocaine abuse in remission, h/o tobacco abuse. Cory Harvey was hospitalized 02/26/2020 to 02/27/2020 for dyspnea with workup significant for pna.  CTA of chest did not show evidence of acute abnormality. Dyspnea and mild hypoxia unclear, suspect possible aspiration, lactic acidosis resolved. Discharged back to [facility] where he currently resides. Cory Harvey is total ADL dependent, requires staff assistance for positioning, mobility, transferring. Cory Harvey does require assistance for toileting as he is incontinence. Cory Harvey  does require assistance for feeding, appetite varies depending on food and time of day. Staff endorses Cory Harvey spends most of his time in bed sleeping. Cory Harvey did have a fall on 03/02/2020, no noted injury. Cory Harvey is a full code.At present, Cory Harvey is lying in bed, appears comfortable, though confused, debilitated. No visitors present. I visited and observed Cory Harvey. Cory Harvey did make eye contact with verbal cues. Cory Harvey was cooperative with assessment though unable to answer questions. No meaningful discussion due to cognitive impairment. Emotional support provided. I called Cory Harvey mother, HCPOA Cory Harvey. We talked about purpose of PC visit. Cory Harvey in agreement. We talked about the last time Cory Harvey was independent at home about 3 or 4 years ago. We talked about past medical history, chronic disease progression. We talked about CVA resulted in long term facility placement. We talked about the last time Cory Harvey saw Cory Harvey which has been over a year ago due to covid pandemic.  We talked about realistic expectations, quality of life. We talked about medical goals of care including aggressive vs conservative vs comfort care. We talked about code status. Cory Harvey endorses her wishes are for Cory Harvey to be a DNR. In agreement, [place] DNR form completed, placed in Vynca/Epic. We reviewed MOST form and talked about Hard Choice book. Cory Harvey in agreement to have blank MOST form and Hard Choice book mailed for review and further discussion with family. Cory Harvey in agreement to revisit at next [facility] visit or sooner if wishes are to complete form. We talked about role of PC in poc/goc. We discussed will have PC follow-up visit in 4 weeks, sooner if declines. Cory Harvey in agreement. Therapeutic listening, emotional support  provided. Questions answered to satisfaction. Contact information provided. I updated nursing staff.  Palliative Care was asked to help address goals of care.   CODE  STATUS: DNR  PPS: 30% HOSPICE ELIGIBILITY/DIAGNOSIS: TBD  PAST MEDICAL HISTORY:  Past Medical History:  Diagnosis Date  . Depression   . History of CVA (cerebrovascular accident)   . Hypertension   . Stroke ([place])    x 4  . Tobacco abuse     SOCIAL HX:  Social History   Tobacco Use  . Smoking status: Current Every Day Smoker    Packs/day: 0.25    Types: Cigarettes  . Smokeless tobacco: Never Used  Substance Use Topics  . Alcohol use: Not Currently    Comment: occasional     ALLERGIES: No Known Allergies   PERTINENT MEDICATIONS:  Outpatient Encounter Medications as of 03/10/2020  Medication Sig  . aspirin 81 MG chewable tablet Chew 1 tablet (81 mg total) by mouth daily.  Marland Kitchen atorvastatin (LIPITOR) 40 MG tablet Take 1 tablet (40 mg total) by mouth at bedtime.  . bisacodyl (DULCOLAX) 5 MG EC tablet Take 1 tablet (5 mg total) by mouth daily as needed for moderate constipation.  . divalproex (DEPAKOTE SPRINKLE) 125 MG capsule Take 125 mg by mouth 2 (two) times daily.  Marland Kitchen escitalopram (LEXAPRO) 10 MG tablet Take 10 mg by mouth daily.  Marland Kitchen ipratropium-albuterol (DUONEB) 0.5-2.5 (3) MG/3ML SOLN Inhale 3 mLs into the lungs every 4 (four) hours as needed for wheezing or shortness of breath.  . losartan (COZAAR) 25 MG tablet Take 1 tablet (25 mg total) by mouth daily.  Marland Kitchen senna-docusate (SENOKOT-S) 8.6-50 MG tablet Take 1 tablet by mouth at bedtime as needed for mild constipation.  . ticagrelor (BRILINTA) 90 MG TABS tablet Take 1 tablet (90 mg total) by mouth 2 (two) times daily.   No facility-administered encounter medications on file as of 03/10/2020.    PHYSICAL EXAM:   General: chronically ill, debilitated, confused male Cardiovascular: regular rate and rhythm Pulmonary: clear ant fields Abdomen: soft, nontender, + bowel sounds Neurological: bedbound   Mirella Gueye Ihor Gully, NP

## 2020-04-07 ENCOUNTER — Other Ambulatory Visit: Payer: Self-pay

## 2020-04-07 ENCOUNTER — Encounter: Payer: Self-pay | Admitting: Nurse Practitioner

## 2020-04-07 ENCOUNTER — Non-Acute Institutional Stay: Payer: Medicare Other | Admitting: Nurse Practitioner

## 2020-04-07 VITALS — BP 134/74 | HR 75 | Temp 97.4°F | Resp 18 | Wt 157.5 lb

## 2020-04-07 DIAGNOSIS — Z515 Encounter for palliative care: Secondary | ICD-10-CM

## 2020-04-07 DIAGNOSIS — I639 Cerebral infarction, unspecified: Secondary | ICD-10-CM

## 2020-04-07 NOTE — Progress Notes (Signed)
[place] Consult Note Telephone: [phone]  Fax: [phone]  PATIENT NAME: Cory Harvey DOB: 1961/05/31 MRN: [id number]  PRIMARY CARE PROVIDER:   Dr Yves Dill [facility] RESPONSIBLE PARTY:   [name] mother [phone]   Recommendations 1. ACP: revisited DNR to continue, discussed MOST form with wishes for full scope of treatment, IVF, antibiotics, intubation if necessary, hospitalization, feed tube. Mailed MOST form to Ms.Smith to sign  2. Palliative Care encounter: Palliative medicine team will continue to support patient, patient's family, and medical team. Visit consisted of counseling and education dealing with the complex and emotionally intense issues of symptom management and palliative care in the setting of serious and potentially life-treating illness.  I spent 60 minutes providing this consultation,  from 10:00am to 10:30am. More than 50% of the time in this consultation was spent coordinating communication.   HISTORY OF PRESENT ILLNESS:  Cory Harvey is a 59 y.o. year old male with multiple medical problems including CVA, right middle cerebral artery, hemiplegic, TIA, seizure disorder, speech disturbance, dysphasia, dysarthria, HTN, HLD, depression, h/o falling, h/o cocaine abuse in remission, h/o tobacco abuse. Cory Harvey was hospitalized 02/26/2020 to 02/27/2020 for dyspnea with workup significant for pna.  CTA of chest did not show evidence of acute abnormality. Dyspnea and mild hypoxia unclear, suspect possible aspiration, lactic acidosis resolved. Discharged back to [facility] where he currently resides. Cory Harvey continues to reside in [place] at [facility]. Cory Harvey does require assistance with adl's. Cory Harvey does feed himself. Good appetite with stable weight, 157.5 with BMI 24.1. Last seen by primary [place]  NP [facility] 03/11/2020 for non-traumatic intracranial hemorrhage affecting left non-dominant side with history of falls. Mr. Klas did have a fall on 03/01/2020 getting out of the wheelchair, no noted injury. No further hospitalization since last palliative visit on 03/10/2020. At present Cory Harvey is lying in bed. He appears comfortable. No visitors present. I visited and observed Cory Harvey. We talked about how you feeling today. Cory Harvey endorses he is doing well. We talked about symptoms of pain and shortness of breath which he replied no. Limited discussion with cognitive impairment. Cory Harvey was cooperative with assessment. We talked about foods that Cory Harvey likes to eat. Discussion was very basic. Emotional support provided. I called Cory Harvey mother, Cory Harvey. We talked about clinical update. We talked about visit with Cory Harvey. We review past medical history, progression of chronic disease. This Wily talked about Cory Harvey history of cocaine abuse. Ms. Tamala Harvey asked about rehab, therapy for Cory Harvey. We talked about Cory Harvey currently at baseline, no new problems that would warrant there be at this time. We talked about his weight, appetite. We talked about symptoms. We talked about medical goals of care including aggressive versus conservative versus Comfort Care. Mrs Pattillo endorses she changed your mind about the DNR that she would like for him to be a full code. We talked again about code status, different scenarios what that would appear like. Mrs. Stainback endorses that she did not understand the full code but she wants to continue do not resuscitate. We talked about most form that was sent for her to review. We talked about treatment options. Ms Colpitts endorses she would want him transferred to the hospital, intubated if necessary. Her wishes are for IV fluids, antibiotics and feeding tube if needed. MOST form completed and will mail to  Ms. Katrinka Blazing to sign and return. Ms. Katrinka Blazing endorses that Mr Bos  father just died. Ms Smith's was wondering if Mr. Trickel should be brought to the funeral. We talked about is functional level, incontinence in adult diapers. Ms. Katrinka Blazing was concerned that it may be best that he does not attend as increasing skill level would be challenging for them to care for him during that time. We talked about the possible option so he can be the funeral. Will follow up with facility to see if that is an option. We talked about role of palliative care and plan of care. We talked at present time Cory Harvey is stable will continue to monitor and follow. Next visit in two months if needed or sooner should he declined. [name] in agreement. I updated nursing staff. No new changes to current goals are plan of care. Therapeutic listening, emotional support provided. Contact information. Questions answered to satisfaction.  Palliative Care was asked to help to continue to address goals of care.   CODE STATUS: DNR  PPS: 40% HOSPICE ELIGIBILITY/DIAGNOSIS: TBD  PAST MEDICAL HISTORY:  Past Medical History:  Diagnosis Date  . Depression   . History of CVA (cerebrovascular accident)   . Hypertension   . Stroke ([facility])    x 4  . Tobacco abuse     SOCIAL HX:  Social History   Tobacco Use  . Smoking status: Current Every Day Smoker    Packs/day: 0.25    Types: Cigarettes  . Smokeless tobacco: Never Used  Substance Use Topics  . Alcohol use: Not Currently    Comment: occasional     ALLERGIES: No Known Allergies   PERTINENT MEDICATIONS:  Outpatient Encounter Medications as of 04/07/2020  Medication Sig  . aspirin 81 MG chewable tablet Chew 1 tablet (81 mg total) by mouth daily.  Marland Kitchen atorvastatin (LIPITOR) 40 MG tablet Take 1 tablet (40 mg total) by mouth at bedtime.  . bisacodyl (DULCOLAX) 5 MG EC tablet Take 1 tablet (5 mg total) by mouth daily as needed for moderate constipation.  . divalproex (DEPAKOTE SPRINKLE) 125 MG capsule Take 125 mg by mouth 2 (two) times daily.  Marland Kitchen  escitalopram (LEXAPRO) 10 MG tablet Take 10 mg by mouth daily.  Marland Kitchen ipratropium-albuterol (DUONEB) 0.5-2.5 (3) MG/3ML SOLN Inhale 3 mLs into the lungs every 4 (four) hours as needed for wheezing or shortness of breath.  . losartan (COZAAR) 25 MG tablet Take 1 tablet (25 mg total) by mouth daily.  Marland Kitchen senna-docusate (SENOKOT-S) 8.6-50 MG tablet Take 1 tablet by mouth at bedtime as needed for mild constipation.  . ticagrelor (BRILINTA) 90 MG TABS tablet Take 1 tablet (90 mg total) by mouth 2 (two) times daily.   No facility-administered encounter medications on file as of 04/07/2020.    PHYSICAL EXAM:   General: NAD, cognitively impaired male Cardiovascular: regular rate and rhythm Pulmonary: clear ant fields Extremities: no edema, no joint deformities Neurological:generalized weakness/wheelchair dependent  Sharlee Rufino Prince [place], NP

## 2020-05-05 ENCOUNTER — Non-Acute Institutional Stay: Payer: Medicare Other | Admitting: Nurse Practitioner

## 2020-05-05 ENCOUNTER — Encounter: Payer: Self-pay | Admitting: Nurse Practitioner

## 2020-05-05 VITALS — BP 134/72 | HR 97 | Temp 97.8°F | Resp 18 | Wt 157.5 lb

## 2020-05-05 DIAGNOSIS — Z515 Encounter for palliative care: Secondary | ICD-10-CM

## 2020-05-05 DIAGNOSIS — I639 Cerebral infarction, unspecified: Secondary | ICD-10-CM

## 2020-05-05 NOTE — Progress Notes (Signed)
[place] Consult Note Telephone: [phone]  Fax: [phone]  PATIENT NAME: Cory Harvey DOB: [birth year] MRN: [id number] PRIMARY CARE PROVIDER:   Dr Yves Dill PROVIDER:Dr Hodges/[facility] RESPONSIBLE PARTY:Jessie Tamala Julian mother [phone]   Recommendations 1. ACP: DNR/MOSt form in vynca  2. Palliative Care encounter: Palliative medicine team will continue to support patient, patient's family, and medical team. Visit consisted of counseling and education dealing with the complex and emotionally intense issues of symptom management and palliative care in the setting of serious and potentially life-treating illness.  I spent 60 minutes providing this consultation, started at 12:00pm. More than 50% of the time in this consultation was spent coordinating communication.   HISTORY OF PRESENT ILLNESS:  Cory Harvey is a 59 y.o. year old male with multiple medical problems including CVA, right middle cerebral artery, hemiplegic, TIA, seizure disorder, speech disturbance, dysphasia, dysarthria, HTN, HLD, depression, h/o falling, h/o cocaine abuse in remission, h/o tobacco abuse.Cory Harvey continues to reside at [place] at [facility]. Cory Harvey does require assistance with ADLs, transfers, mobility. Cory Harvey is able to sit up in the wheelchair. Cory Harvey does feed himself after a tray setup and prompting encouraging. Appetite has been fair with stable weight. No recent falls, wounds, infections, hospitalizations. MOST form was completed with DNR, limited additional interventions, no feeding tube but wishes are for IV fluids, antibiotics. Staff endorses no news concerns. At present Cory Harvey is lying in bed. He appears comfortable. No visitors present. I visited and observed Cory Harvey. We talked about purpose of palliative care visit. Cory Harvey does make eye  contact and answer simple questions. Limited discussion due to cognitive impairment. Cory Harvey denies symptoms of pain or shortness of breath. We talked about Cory Harvey appetite. Cory Harvey endorses that food tastes good. I eat. Cory Harvey was cooperative with assessment. Emotional support provided. Medical goals reviewed. No new changes to current goals are plan of care. I attempted to contact Cory Harvey mom, Ms. Tamala Julian for update on palliative care visit. Palliative Care was asked to help to continue to address goals of care.   CODE STATUS: DNR  PPS: 40% HOSPICE ELIGIBILITY/DIAGNOSIS: TBD  PAST MEDICAL HISTORY:  Past Medical History:  Diagnosis Date  . Depression   . History of CVA (cerebrovascular accident)   . Hypertension   . Stroke ([place])    x 4  . Tobacco abuse     SOCIAL HX:  Social History   Tobacco Use  . Smoking status: Current Every Day Smoker    Packs/day: 0.25    Types: Cigarettes  . Smokeless tobacco: Never Used  Substance Use Topics  . Alcohol use: Not Currently    Comment: occasional     ALLERGIES: No Known Allergies   PERTINENT MEDICATIONS:  Outpatient Encounter Medications as of 05/05/2020  Medication Sig  . aspirin 81 MG chewable tablet Chew 1 tablet (81 mg total) by mouth daily.  Marland Kitchen atorvastatin (LIPITOR) 40 MG tablet Take 1 tablet (40 mg total) by mouth at bedtime.  . bisacodyl (DULCOLAX) 5 MG EC tablet Take 1 tablet (5 mg total) by mouth daily as needed for moderate constipation.  . divalproex (DEPAKOTE SPRINKLE) 125 MG capsule Take 125 mg by mouth 2 (two) times daily.  Marland Kitchen escitalopram (LEXAPRO) 10 MG tablet Take 10 mg by mouth daily.  Marland Kitchen ipratropium-albuterol (DUONEB) 0.5-2.5 (3) MG/3ML SOLN Inhale 3 mLs into the lungs every  4 (four) hours as needed for wheezing or shortness of breath.  . losartan (COZAAR) 25 MG tablet Take 1 tablet (25 mg total) by mouth daily.  Marland Kitchen senna-docusate (SENOKOT-S) 8.6-50 MG tablet Take 1 tablet by mouth at bedtime as needed for  mild constipation.  . ticagrelor (BRILINTA) 90 MG TABS tablet Take 1 tablet (90 mg total) by mouth 2 (two) times daily.   No facility-administered encounter medications on file as of 05/05/2020.    PHYSICAL EXAM:   General: NAD, frail appearing, thin, debilitated, male Cardiovascular: regular rate and rhythm Pulmonary: clear ant fields Neurological: w/c dependent  Salisha Bardsley Prince Rome, NP

## 2020-05-06 ENCOUNTER — Other Ambulatory Visit: Payer: Self-pay

## 2020-06-30 ENCOUNTER — Other Ambulatory Visit: Payer: Self-pay

## 2020-06-30 ENCOUNTER — Encounter: Payer: Self-pay | Admitting: Nurse Practitioner

## 2020-06-30 ENCOUNTER — Non-Acute Institutional Stay: Payer: Medicare Other | Admitting: Nurse Practitioner

## 2020-06-30 VITALS — Wt 157.5 lb

## 2020-06-30 DIAGNOSIS — Z515 Encounter for palliative care: Secondary | ICD-10-CM

## 2020-06-30 DIAGNOSIS — I639 Cerebral infarction, unspecified: Secondary | ICD-10-CM

## 2020-06-30 NOTE — Progress Notes (Signed)
Therapist, nutritional Palliative Care Consult Note Telephone: [phone]  Fax: [phone]  PATIENT NAME: Cory Harvey DOB: 05/23/1961 MRN: [id number] PRIMARY CARE PROVIDER:Dr Kathleen Lime PROVIDER:Dr Hodges/[facility] RESPONSIBLE PARTY:Jessie Katrinka Harvey mother [phone]  Recommendations 1. ACP:DNR/MOSt form in vynca  2. Palliative Care encounter: Palliative medicine team will continue to support patient, patient's family, and medical team. Visit consisted of counseling and education dealing with the complex and emotionally intense issues of symptom management and palliative care in the setting of serious and potentially life-treating illness. I spent 60 minutes providing this consultation,  Start at 10:35am. More than 50% of the time in this consultation was spent coordinating communication.   HISTORY OF PRESENT ILLNESS:  Cory Harvey is a 59 y.o. year old male with multiple medical problems including CVA, right middle cerebral artery, hemiplegic, TIA, seizure disorder, speech disturbance, dysphasia, dysarthria, HTN, HLD, depression, h/o falling, h/o cocaine abuse in remission, h/o tobacco abuse.Cory Harvey continues to reside at [facility] Cory Harvey does require assistance for transferring, mobility, turning and positioning. Cory Harvey does get up to the wheelchair and is able to sit. Cory Harvey is ADL dependent and incontinent episodes. Cory Harvey does feed himself after tray setup. Appetite has been fair. Cory Harvey is able to answer simple questions. No recent wounds, hospitalizations, infections, falls. Last Primary Provider [facility] visit 6 / 22 / 2021 for vascular dementia with behavioral disturbances. Medical goals to focus on wishes for antibiotics, diagnostic testing, hospitalization, IV fluid hydrate, lab testing but no intubation, no feeding tube. Cory Harvey is a DNR. At  present Cory Harvey is lying in bed, appears comfortable though debilitated. No visitors present. I visited and observed Cory Harvey. We talked about purpose of palliative care visit. Cory Harvey smiled and said thank you "I am glad you came to see me". We talked about symptoms of pain and shortness of breath what she denies. We talked about his appetite. Cory Harvey endorses he is always hungry. We talked about food said he likes. We talked about resigning at the skilled facility. We talked about his mom recently coming to visit him for his birthday. We talked about mobility and getting out of bed. Cory Harvey politely decline today said that there was no place for him to go. We talked about getting in the wheelchair and sitting in his room rather than watching TV lying in bed. Cory Harvey was in agreement. Cory Harvey was cooperative with assessment. Emotional support provided. Most of visit was supportive. Asked Cory Harvey if it was okay to contact his mother who is this health care power-of-attorney. Mr. Kimura in agreement. I called Cory Harvey, Cory Harvey mother for clinical update on palliative care visit. Cory Harvey and I talked about palliative care visit with Cory Harvey. We talked about symptoms. We talked about her visit with him a few days ago for his birthday. We talked about chronic disease progression. We talked about medical goals of care. We talked about Cory Harvey residing at [facility]. Cory Harvey asked if he could return home. We talked about at this point in time with his ability that he requires total Care and best plan is where he currently is. We talked about seeing Cory Harvey as often as visiting hours at the facility allows. We talked about role of palliative care in plan of care. We talked about coping strategies when you have a loved one in  a facility. Cory Harvey was thankful for palliative call and update. Therapeutic listening, emotional support provided. Contact information provided. Questions answered that  satisfaction. I have updated nursing staff many changes to current goals our plan of care. Ask staff to reweigh Cory Harvey. Palliative Care was asked to help to continue to address goals of care.   CODE STATUS: DNR  PPS: 30% HOSPICE ELIGIBILITY/DIAGNOSIS: TBD  PAST MEDICAL HISTORY:  Past Medical History:  Diagnosis Date  . Depression   . History of CVA (cerebrovascular accident)   . Hypertension   . Stroke ([facility])    x 4  . Tobacco abuse     SOCIAL HX:  Social History   Tobacco Use  . Smoking status: Current Every Day Smoker    Packs/day: 0.25    Types: Cigarettes  . Smokeless tobacco: Never Used  Substance Use Topics  . Alcohol use: Not Currently    Comment: occasional     ALLERGIES: No Known Allergies   PERTINENT MEDICATIONS:  Outpatient Encounter Medications as of 06/30/2020  Medication Sig  . aspirin 81 MG chewable tablet Chew 1 tablet (81 mg total) by mouth daily.  Marland Kitchen atorvastatin (LIPITOR) 40 MG tablet Take 1 tablet (40 mg total) by mouth at bedtime.  . bisacodyl (DULCOLAX) 5 MG EC tablet Take 1 tablet (5 mg total) by mouth daily as needed for moderate constipation.  . divalproex (DEPAKOTE SPRINKLE) 125 MG capsule Take 125 mg by mouth 2 (two) times daily.  Marland Kitchen escitalopram (LEXAPRO) 10 MG tablet Take 10 mg by mouth daily.  Marland Kitchen ipratropium-albuterol (DUONEB) 0.5-2.5 (3) MG/3ML SOLN Inhale 3 mLs into the lungs every 4 (four) hours as needed for wheezing or shortness of breath.  . losartan (COZAAR) 25 MG tablet Take 1 tablet (25 mg total) by mouth daily.  Marland Kitchen senna-docusate (SENOKOT-S) 8.6-50 MG tablet Take 1 tablet by mouth at bedtime as needed for mild constipation.  . ticagrelor (BRILINTA) 90 MG TABS tablet Take 1 tablet (90 mg total) by mouth 2 (two) times daily.   No facility-administered encounter medications on file as of 06/30/2020.    PHYSICAL EXAM:   General: NAD, frail appearing, thin, debilitated pleasant male Cardiovascular: regular rate and rhythm Pulmonary:  clear ant fields Neurological: functional quadriplegic  Cory Bullen Prince [place], NP

## 2020-09-29 ENCOUNTER — Encounter: Payer: Self-pay | Admitting: Nurse Practitioner

## 2020-09-29 ENCOUNTER — Other Ambulatory Visit: Payer: Self-pay

## 2020-09-29 ENCOUNTER — Non-Acute Institutional Stay: Payer: Medicare Other | Admitting: Nurse Practitioner

## 2020-09-29 DIAGNOSIS — Z515 Encounter for palliative care: Secondary | ICD-10-CM

## 2020-09-29 DIAGNOSIS — I639 Cerebral infarction, unspecified: Secondary | ICD-10-CM

## 2020-09-29 NOTE — Progress Notes (Signed)
Therapist, nutritional Palliative Care Consult Note Telephone: [phone]  Fax: [phone]  PATIENT NAME: Cory Harvey DOB: [birth year] MRN: [id number]  PRIMARY CARE PROVIDER:Dr Kathleen Lime PROVIDER:Dr Hodges/[facility] RESPONSIBLE PARTY:Cory Harvey mother [phone]  Recommendations 1. ACP:DNR/MOSt form in vynca  2. Palliative Care encounter: Palliative medicine team will continue to support patient, patient's family, and medical team. Visit consisted of counseling and education dealing with the complex and emotionally intense issues of symptom management and palliative care in the setting of serious and potentially life-treating illness. I spent 60 minutes providing this consultation,  Start at 10:35am. More than 50% of the time in this consultation was spent coordinating communication.   3. F/u 8 weeks with ongoing monitoring weights, disease progression, monitor for decline  I spent 50 minutes providing this consultation, starting at 11:30am. More than 50% of the time in this consultation was spent coordinating communication.   HISTORY OF PRESENT ILLNESS:  Cory Harvey is a 59 y.o. year old male with multiple medical problems including CVA, right middle cerebral artery, hemiplegic, TIA, seizure disorder, speech disturbance, dysphasia, dysarthria, HTN, HLD, depression, h/o falling, h/o cocaine abuse in remission, h/o tobacco abuse. Mr. Ashraf continues to recited [facility]. Mr. Meditz does continue to get up every day and sit in a wheelchair but he requires for him. Mr. Wadel does require assistance for ADL dependents, bathing, dressing, toileting. Dail does require a setup and feeding. BMI 23.1 with weight 147.2 lb how much is 2.2 pound weight loss in the last 4 weeks. Currently on a pureed texture, regular diet, mildly thickened consistency. No recent falls,  wounds, infections, hospitalizations. Mr. Trompeter is followed by Podiatry at the facility. Last [facility] Nurse Practitioner note 9 / 24 / 2021 for epilepsy with no recent seizure activity continue to take Depakote. Medical goals focus on DNR, do not intubate, no feeding tube but wishes are for antibiotics, diagnostic testing, hospitalization, IV hydration, lab testing. Staff endorses no known changes or concerns. At present Mr. Cotta is lying in bed. Mr. Cotham appears comfortable, no visitors present. I visited and observed Mr. Gage. Mr. Holtzer endorses that he got up to a w/c today He was back to bed for a rest. We talked about his daily routine. We talked about something from pain and shortness of breath, he denied. We talked about symptoms of appetite and the food that he liked. We talked about things that he likes to do he does interact with his rib pain. Mr. Szymborski was cooperative with the assessment. Limited verbal discussion with cognitive impairment. Emotional support provided. Medical goals reviewed. I have attempted to contact his mom, Ms. Katrinka Harvey for update on palliative care visit. I have updated nursing staff  Palliative Care was asked to help to continue to address goals of care.   CODE STATUS: DNR  PPS: 40% HOSPICE ELIGIBILITY/DIAGNOSIS: TBD  PAST MEDICAL HISTORY:  Past Medical History:  Diagnosis Date  . Depression   . History of CVA (cerebrovascular accident)   . Hypertension   . Stroke (HCC)    x 4  . Tobacco abuse     SOCIAL HX:  Social History   Tobacco Use  . Smoking status: Current Every Day Smoker    Packs/day: 0.25    Types: Cigarettes  . Smokeless tobacco: Never Used  Substance Use Topics  . Alcohol use: Not Currently    Comment: occasional  ALLERGIES: No Known Allergies     PHYSICAL EXAM:   General: debilitated, chronically ill, pleasant cognitively mild impaired male Cardiovascular: regular rate and rhythm Pulmonary: clear ant fields Neurological:  non-ambulatory; w/c  Avelynn Sellin Prince Rome, NP

## 2020-10-27 ENCOUNTER — Encounter: Payer: Self-pay | Admitting: Nurse Practitioner

## 2020-10-27 ENCOUNTER — Other Ambulatory Visit: Payer: Self-pay

## 2020-10-27 ENCOUNTER — Non-Acute Institutional Stay: Payer: Medicare Other | Admitting: Nurse Practitioner

## 2020-10-27 VITALS — Wt 114.2 lb

## 2020-10-27 DIAGNOSIS — Z515 Encounter for palliative care: Secondary | ICD-10-CM

## 2020-10-27 DIAGNOSIS — I639 Cerebral infarction, unspecified: Secondary | ICD-10-CM

## 2020-10-27 NOTE — Progress Notes (Addendum)
    Therapist, nutritional Palliative Care Consult Note Telephone: [phone]  Fax: [phone]  PATIENT NAME: Cory Harvey DOB: 08/30/1961 MRN: [id number]  PRIMARY CARE PROVIDER:Dr Kathleen Lime PROVIDER:Dr Hodges/[facility] RESPONSIBLE PARTY:Jessie Katrinka Blazing mother [phone]  Recommendations 1. ACP:DNR/MOST form in vynca  2. Palliative Care encounter: Palliative medicine team will continue to support patient, patient's family, and medical team. Visit consisted of counseling and education dealing with the complex and emotionally intense issues of symptom management and palliative care in the setting of serious and potentially life-treating illness.  3. F/u 8 weeks with ongoing monitoring weights, disease progression, monitor for decline  I spent60minutes providing this consultation, Start at 10:35am. More than 50% of the time in this consultation was spent coordinating communication.    HISTORY OF PRESENT ILLNESS:  Cory Harvey is a 59 y.o. year old malewith multiple medical problems including CVA, right middle cerebral artery, hemiplegic, TIA, seizure disorder, speech disturbance, dysphasia, dysarthria, HTN, HLD, depression, h/o falling, h/o cocaine abuse in remission, h/o tobacco abuse. Cory Harvey continues to reside at [facility]. Cory Harvey does transfer to the wheelchair where he does sit up during the day. Cory Harvey does require assistance with bathing, dressing, tray setup. Cory Harvey does feeding himself and appetite has been fair with current weight 114.2 with BMI 17.9. Cory Harvey is on a regular diet, pureed texture, mildly thick consistency. No recent wounds, falls, infections, hospitalizations. Staff endorses no new changes their concerns. Palliative to continuing to monitor and follow weights, chronic disease progression and overall decline. I visited and observed  Cory Harvey. Cory Harvey does make eye contact with verbal cues. Cory Harvey is interactive when asked questions though limited due to cognitive impairment. Cory Harvey repeated that he would like some sweets to eat. Cory Harvey denied symptoms of pain, shortness of breath. Cory Harvey endorses that he is sleeping well. We talked about getting out of bed. Cory Harvey endorses he is not been out of bed today but he was out of bed yesterday. We talked about residing at the facility. Emotional support provided. Cory Harvey was cooperative with assessment. Emotional support provided. Will continue to follow monitoring weight, appetite, disease progression what's next visit in 2 months if needed or sooner should he decline. I have attempted to contact Cory Harvey mother, message left. I have updated nursing staff any changes at present time.  Palliative Care was asked to help to continue to address goals of care.   CODE STATUS: DNR  PPS: 40% HOSPICE ELIGIBILITY/DIAGNOSIS: TBD  PAST MEDICAL HISTORY:  Past Medical History:  Diagnosis Date  . Depression   . History of CVA (cerebrovascular accident)   . Hypertension   . Stroke (HCC)    x 4  . Tobacco abuse     SOCIAL HX:  Social History   Tobacco Use  . Smoking status: Current Every Day Smoker    Packs/day: 0.25    Types: Cigarettes  . Smokeless tobacco: Never Used  Substance Use Topics  . Alcohol use: Not Currently    Comment: occasional     ALLERGIES: No Known Allergies   PHYSICAL EXAM:   General: debilitated pleasant male Cardiovascular: regular rate and rhythm Pulmonary: clear ant fields Neurological: w/c dependent  Hayslee Casebolt Prince Rome, NP

## 2020-12-17 ENCOUNTER — Encounter: Payer: Self-pay | Admitting: Nurse Practitioner

## 2020-12-17 ENCOUNTER — Non-Acute Institutional Stay: Payer: Medicare Other | Admitting: Nurse Practitioner

## 2020-12-17 ENCOUNTER — Other Ambulatory Visit: Payer: Self-pay

## 2020-12-17 VITALS — BP 108/79 | HR 76 | Temp 97.6°F | Wt 141.4 lb

## 2020-12-17 DIAGNOSIS — I639 Cerebral infarction, unspecified: Secondary | ICD-10-CM

## 2020-12-17 DIAGNOSIS — Z515 Encounter for palliative care: Secondary | ICD-10-CM

## 2020-12-17 NOTE — Progress Notes (Signed)
Therapist, nutritional Palliative Care Consult Note Telephone: [phone]  Fax: [phone]  PATIENT NAME: Cory Harvey DOB: 02/11/1961 MRN: [id number]  PRIMARY CARE PROVIDER:   [facility] RESPONSIBLE PARTY:Cory Harvey mother [phone]  Recommendations 1. ACP:DNR/MOST form in vynca  2. Palliative Care encounter: Palliative medicine team will continue to support patient, patient's family, and medical team. Visit consisted of counseling and education dealing with the complex and emotionally intense issues of symptom management and palliative care in the setting of serious and potentially life-treating illness.  3. F/u 8 weeks with ongoing monitoring weights, disease progression, monitor for decline  I spent 40 minutes providing this consultation, starting at 11:15am. More than 50% of the time in this consultation was spent coordinating communication.   HISTORY OF PRESENT ILLNESS:  Cory Harvey is a 60 y.o. year old male with multiple medical problems including CVA, right middle cerebral artery, hemiplegic, TIA, seizure disorder, speech disturbance, dysphasia, dysarthria, HTN, HLD, depression, h/o falling, h/o cocaine abuse in remission, h/o tobacco abuse. Cory Harvey continues to reside at [facility]. Cory Harvey does require assistance for transferring to a wheelchair where he is able to sit up. Cory Harvey does require assistance from staff to bathe and dress him. Cory Harvey does feed himself. 141.4 lb with BMI 22.1 with stable weight over the last three months. Cory Harvey is on a regular diet. Texture mild thickened consistency. Care plan meeting was held on 12/30 / 2021 with Carley Hammed RP invited and did not return call. No changes in care plan meeting. Last [facility] Primary Provider note 12 / 8 / 2021 for vascular dementia with behaviors, repeated falls, hemiplegia and hemiparesis.  Cognitive impairment with impulsive behaviors getting up without help resulting in falls. Medical goals focus on DNR, do not intubate, no feeding tube but wishes are for antibiotics, diagnostic testing, hospitalization, lab testing, IV hydration. No recent wounds, falls, infections, hospitalizations. At present Cory Harvey is lying in bed. Cory Harvey appears comfortable. No visitors present. I visited and observed Cory Harvey. We talked about purpose of Palliative care visit. Cory Harvey does make eye contact and is very interactive answering questions.Cory Harvey denied symptoms of pain and shortness of breath. We talked about his appetite. We talked about foods that he likes. Cory Harvey was unable to recall what he ate for breakfast. We talked about Cory Harvey daily routine. We talked about getting out of bed. We talked about how I fall risk. Cory Harvey endorses he does not get up by himself anymore. We talked about we talked about things that he likes to do during the day. Cory Harvey endorses he enjoys visiting with the staff, watching what is going on around him. We talked about quality of life. Cory Harvey endorses he is very happy at [facility]. They take good care of him. We talked about role of Palliative care. Limited discussion with cognitive impairment. Cory Harvey was cooperative with this assessment. Palliative care visit supportive. No new changes recommended at this time. I have attempted to contact Waymon Budge mother message left. Medical goals reviewed. I updated nursing staff.  Palliative Care was asked to help to continue to address goals of care.   CODE STATUS: DNR  PPS: 40% HOSPICE ELIGIBILITY/DIAGNOSIS: TBD  PAST MEDICAL HISTORY:  Past Medical History:  Diagnosis Date  . Depression   . History of CVA (cerebrovascular accident)   . Hypertension   .  Stroke (HCC)    x 4  . Tobacco abuse     SOCIAL HX:  Social History   Tobacco Use  . Smoking status: Current Every Day Smoker     Packs/day: 0.25    Types: Cigarettes  . Smokeless tobacco: Never Used  Substance Use Topics  . Alcohol use: Not Currently    Comment: occasional     ALLERGIES: No Known Allergies   PHYSICAL EXAM:   General: NAD, debilitated, oriented to self male Cardiovascular: regular rate and rhythm Pulmonary: clear ant fields Neurological: w/c dependent  Bern Fare Prince Rome, NP

## 2021-02-02 ENCOUNTER — Encounter: Payer: Self-pay | Admitting: Nurse Practitioner

## 2021-02-02 ENCOUNTER — Non-Acute Institutional Stay: Payer: Medicare Other | Admitting: Nurse Practitioner

## 2021-02-02 ENCOUNTER — Other Ambulatory Visit: Payer: Self-pay

## 2021-02-02 VITALS — BP 141/71 | HR 72 | Resp 18 | Wt 143.0 lb

## 2021-02-02 DIAGNOSIS — I639 Cerebral infarction, unspecified: Secondary | ICD-10-CM

## 2021-02-02 DIAGNOSIS — Z515 Encounter for palliative care: Secondary | ICD-10-CM

## 2021-02-02 NOTE — Progress Notes (Signed)
Therapist, nutritional Palliative Care Consult Note Telephone: [phone]  Fax: [phone]  PATIENT NAME: Cory Harvey DOB: 1961-04-23 MRN: [id number] PRIMARY CARE PROVIDER:   [facility] RESPONSIBLE PARTY:Jessie Katrinka Blazing mother [phone] Recommendations 1. ACP:DNR/MOSTform in vynca  2. Palliative Care encounter: Palliative medicine team will continue to support patient, patient's family, and medical team. Visit consisted of counseling and education dealing with the complex and emotionally intense issues of symptom management and palliative care in the setting of serious and potentially life-treating illness.  3. F/u 4 weeks with ongoing monitoring weights, disease progression, monitor for decline  I spent 35 minutes providing this consultation,  Starting at 10:30am. More than 50% of the time in this consultation was spent coordinating communication.   HISTORY OF PRESENT ILLNESS:  Cory Harvey is a 60 y.o. year old male with multiple medical problems including CVA, right middle cerebral artery, hemiplegic, TIA, seizure disorder, speech disturbance, dysphasia, dysarthria, HTN, HLD, depression, h/o falling, h/o cocaine abuse in remission, h/o tobacco abuse. [name] continues to reside at [facility]. [name] does transfer to the wheelchair and is able to sit up during the day. [name] does require assistance to be bathed and dressed as he wears adult diapers for incontinence. [name] does feed himself and appetite has been good per staff. Current weight 4.6 lb with weight gain in 3 months. BMI 22.4. Regular diet, puree texture, mild thickened consistency diet. [name] is followed by a Podiatry at the facility. Last primary [facility] provider note 1/19 / 2022 for depression ongoing with medical therapy stable. Wishes are for DNR, do not intubate, no feeding tube but wishes for IV  fluids, antibiotics, hospitalization, diagnostic testing, lab testing. No recent wounds, falls, infections, hospitalizations. [name] is able to verbalize basic simple needs. At present [name] is lying in bed. [name] appears comfortable. No visitors present. I visited and observed [name]. We talked about purpose of Palliative care visit. We talked about how he was feeling today. [name] endorses he is doing well. Smiling and interactive, making eye contact. [name] was cooperative with this estimate. [name] denied symptoms of pain or shortness of breath. [name] endorses his appetite is very good. [name] likes food in the staff brings him good things to eat. Limited verbal discussion with cognitive impairment. Most of Palliative care visit supportive. I have attempted to contact his mom for update on palliative care visit. Medical goals reviewed. Weight stable. Will continue to Monitor and follow palliative care visit in 4 weeks, ongoing  discussions of complex medical decision-making. I have updated nursing staff, continue current poc.   Palliative Care was asked to help to continue to address goals of care.   CODE STATUS: DNR  PPS: 40% HOSPICE ELIGIBILITY/DIAGNOSIS: TBD  PAST MEDICAL HISTORY:  Past Medical History:  Diagnosis Date  . Depression   . History of CVA (cerebrovascular accident)   . Hypertension   . Stroke (HCC)    x 4  . Tobacco abuse     SOCIAL HX:  Social History   Tobacco Use  . Smoking status: Current Every Day Smoker    Packs/day: 0.25    Types: Cigarettes  . Smokeless tobacco: Never Used  Substance Use Topics  . Alcohol use: Not Currently    Comment: occasional     ALLERGIES: No Known Allergies     PHYSICAL EXAM:  General: NAD, debilitated, pleasant male Cardiovascular: regular rate and rhythm Pulmonary: clear ant fields Abdomen: soft, nontender, + bowel sounds Neurological: w/c dependent  Evella Kasal Prince Rome, NP

## 2021-04-06 ENCOUNTER — Other Ambulatory Visit: Payer: Self-pay

## 2021-04-06 ENCOUNTER — Non-Acute Institutional Stay: Payer: Medicare Other | Admitting: Nurse Practitioner

## 2021-04-06 ENCOUNTER — Encounter: Payer: Self-pay | Admitting: Nurse Practitioner

## 2021-04-06 VITALS — BP 122/78 | HR 78 | Temp 97.5°F | Resp 18 | Wt 143.0 lb

## 2021-04-06 DIAGNOSIS — R5381 Other malaise: Secondary | ICD-10-CM

## 2021-04-06 DIAGNOSIS — Z515 Encounter for palliative care: Secondary | ICD-10-CM

## 2021-04-06 NOTE — Progress Notes (Signed)
[facility] Consult Note Telephone: [phone]  Fax: [phone]    Date of encounter: 04/06/21 PATIENT NAME: Marissa Lowrey [place] [address]   [phone] (home)  DOB: [birth year] MRN: [id number] PRIMARY CARE PROVIDER:   Dr [facility] RESPONSIBLE PARTY:    Contact Information    Name Relation Home Work Mobile   [name] Mother   [phone]     I met face to face with patient facility. Palliative Care was asked to follow this patient by consultation request of Dr Reesa Chew to address advance care planning and complex medical decision making. This is a follow up visit.  ASSESSMENT AND PLAN / RECOMMENDATIONS:   Advance Care Planning/Goals of Care: Goals include to maximize quality of life and symptom management. Our advance care planning conversation included a discussion about:     The value and importance of advance care planning   Experiences with loved ones who have been seriously ill or have died   Exploration of personal, cultural or spiritual beliefs that might influence medical decisions   Exploration of goals of care in the event of a sudden injury or illness   Identification and preparation of a healthcare agent   Review and updating or creation of an  advance directive document .  Decision not to resuscitate or to de-escalate disease focused treatments due to poor prognosis.  CODE STATUS: DNR  Symptom Management/Plan: 1. ACP:DNR/MOSTform in vynca  2. Debility secondary to CVA, encourage oob, restorative exercises; socialization;   3. Palliative Care encounter: Palliative medicine team will continue to support patient, patient's family, and medical team. Visit consisted of counseling and education dealing with the complex and emotionally intense issues of symptom management and palliative care in the setting of serious and potentially life-treating  illness.   Follow up Palliative Care Visit: Palliative care will continue to follow for complex medical decision making, advance care planning, and clarification of goals. Return 6 weeks or prn.  I spent 35 minutes providing this consultation starting at 1:20pm. More than 50% of the time in this consultation was spent in counseling and care coordination.  PPS: 40%  HOSPICE ELIGIBILITY/DIAGNOSIS: TBD  Chief Complaint: Palliative follow up consult for complex medical decision making  HISTORY OF PRESENT ILLNESS:  Dyquan Minks is a 60 y.o. year old male  with CVA, right middle cerebral artery, hemiplegic, TIA, seizure disorder, speech disturbance, dysphasia, dysarthria, HTN, HLD, depression, h/o falling, h/o cocaine abuse in remission, h/o tobacco abuse. Mr Pandya continues to reside at [place] [facility]. Mr Graham does transfer to the wheelchair and is able to sit up during the day. Mr Weigel does require assistance to be bathed and dressed as he wears adult diapers for incontinence. Mr. Wronski does feed himself after tray setup. Staff endorses appetite is fair with current weight  143 lbs with BMI 22.4. Last psychiatry visit 03/17/2021 for vascular dementia with h/o NCD, MDD prescribed depakote  for dementia related behavior and with escitalopram for mood, major depressive disorder. No changes at this visit. Staff endorses no other changes or concerns. Mr. Fiallos was recently moved to a new room with a new room-mate. At present, Mr. Gladman is lying in bed. Mr Beazer appears comfortable, oriented to self. No visitors present. I visited and observed Mr. Wander. Mr Wahlen does make eye contact. Mr. Hritz is able to answer simple questions, limited with cognitive impairment.  We talked about symptoms of pain, shortness of breath which he denies. We talked about appetite. Mr. Boulden was able to talk about food that he likes to eat. We talked about his new room. We  talked about things Mr. Gassert likes to do through the day, watch TV. We talked about getting oob to recliner. Mr. Tata endorses he does get up. Mr. Westrup was cooperative with assessment. Mr. Wiebelhaus was smiling, interactive, making eye contact. Most of PC visit supportive. I have attempted to contact Mr. Mcmillen mom for update on PC visit. Medical goals reviewed. No changes to poc at this visit, will continue to monitor overall slow decline, debility, appetite with weights. Updated staff.   History obtained from review of EMR, discussion and interview with  facility staff and Mr. Bialas. I reviewed available labs, medications, imaging, studies and related documents from the EMR.  Records reviewed and summarized above.   ROS Full 14 system review of systems performed and negative with exception of: as per HPI.  Physical Exam: Constitutional: NAD General: frail appearing, thin, cognitively impaired male EYES: lids intact ENMT: oral mucous membranes moist CV: S1S2, RRR, no LE edema Pulmonary: LCTA, no increased work of breathing, room air Abdomen: normo-active BS + 4 quadrants, soft and non tender MSK: w/c dependent Skin: warm and dry, no rashes or wounds on visible skin Neuro:  + generalized weakness,  +cognitive impairment Psych: non-anxious affect, A and O x 2  Questions and concerns were addressed. The patient/staff was encouraged to call with questions and/or concerns. Provided general support and encouragement, no other unmet needs identified  Thank you for the opportunity to participate in the care of Mr. Disney.  The palliative care team will continue to follow. Please call our office at [phone] if we can be of additional assistance.   This chart was dictated using voice recognition software. Despite best efforts to proofread, errors can occur which can change the documentation meaning.  Kody Vigil Ihor Gully, NP , MSN, [facility]

## 2021-06-03 ENCOUNTER — Non-Acute Institutional Stay: Payer: Medicare Other | Admitting: Nurse Practitioner

## 2021-06-03 ENCOUNTER — Encounter: Payer: Self-pay | Admitting: Nurse Practitioner

## 2021-06-03 ENCOUNTER — Other Ambulatory Visit: Payer: Self-pay

## 2021-06-03 DIAGNOSIS — Z515 Encounter for palliative care: Secondary | ICD-10-CM

## 2021-06-03 DIAGNOSIS — R5381 Other malaise: Secondary | ICD-10-CM

## 2021-06-03 NOTE — Progress Notes (Signed)
[facility] Consult Note Telephone: [phone]  Fax: [phone]    Date of encounter: 06/03/21 PATIENT NAME: Cory Harvey [place] [address]   [phone] (home)  DOB: 08-Aug-1961 MRN: [id number] PRIMARY CARE PROVIDER:   Dr Lucianne Lei [facility]  RESPONSIBLE PARTY:    Contact Information     Name Relation Home Work Mobile   [name] Mother   [phone]      I met face to face with patient in facility. Palliative Care was asked to follow this patient by consultation request of  Dr Nona Dell to address advance care planning and complex medical decision making. This is a follow up visit.  ASSESSMENT AND PLAN / RECOMMENDATIONS:   Symptom Management/Plan: 1. ACP: DNR/MOST form in vynca   2. Debility secondary to CVA, encourage oob, restorative exercises; socialization;    3. Palliative Care encounter: Palliative medicine team will continue to support patient, patient's family, and medical team. Visit consisted of counseling and education dealing with the complex and emotionally intense issues of symptom management and palliative care in the setting of serious and potentially life-treating illness.  Follow up Palliative Care Visit: Palliative care will continue to follow for complex medical decision making, advance care planning, and clarification of goals. Return 4 weeks or prn.  I spent 35 minutes providing this consultation. More than 50% of the time in this consultation was spent in counseling and care coordination.  PPS: 30%  HOSPICE ELIGIBILITY/DIAGNOSIS: TBD  Chief Complaint: follow up palliative consult for complex medical decision making  HISTORY OF PRESENT ILLNESS:  Cory Harvey is a 60 y.o. year old male  with multiple medical problems including CVA, right middle cerebral artery, hemiplegic, TIA, seizure disorder, speech disturbance, dysphasia, dysarthria, HTN, HLD,  depression, h/o falling, h/o cocaine abuse in remission, h/o tobacco abuse. Cory Harvey continues to reside at [place] at [facility]. Cory Harvey does transfer to the wheelchair and is able to sit up during the day. Cory Harvey does require assistance to be bathed and dressed as he wears adult diapers for incontinence. Cory. Harvey does feed himself after tray setup. Staff endorses appetite is fair with current weight  144 with BMI 22.6. Staff endorses no other changes or concerns. Cory. Harvey was recently moved to a new room with a new room-mate. At present, Cory. Harvey is lying in bed. Cory Harvey appears comfortable, oriented to self. No visitors present. I visited and observed Cory. Harvey. Cory Harvey does make eye contact. Cory. Harvey is able to answer simple questions, limited with cognitive impairment. Cory Harvey was cooperative with assessment. No new changes recommended to poc. I updated nursing staff, attempted to contact mother for update on pc visit  History obtained from review of EMR, discussion with facility staff and Cory. Moreland.  I reviewed available labs, medications, imaging, studies and related documents from the EMR.  Records reviewed and summarized above.   ROS  Full 14 system review of systems performed and negative with exception of: as per HPI.   Physical Exam: Constitutional: NAD General: frail appearing, decompensated male EYES:  lids intact ENMT: oral mucous membranes moist CV: S1S2, RRR Pulmonary: LCTA, no increased work of breathing, no cough, room air Abdomen: normo-active BS + 4 quadrants, soft and non tender MSK: w/c dependent, lift transfers Skin: warm and dry, no rashes or wounds on visible skin Neuro:  + generalized weakness,  +  cognitive impairment Psych: non-anxious affect, alert, oriented to self  Questions and concerns were addressed. The staff was encouraged to call with questions and/or concerns. Provided general support and  encouragement, no other unmet needs identified   Thank you for the opportunity to participate in the care of Cory. Harvey.  The palliative care team will continue to follow. Please call our office at [phone] if we can be of additional assistance.   This chart was dictated using voice recognition software.  Despite best efforts to proofread,  errors can occur which can change the documentation meaning.   Aayliah Rotenberry Ihor Gully, NP , MSN,, [facility]

## 2021-07-18 ENCOUNTER — Non-Acute Institutional Stay: Payer: Medicare Other | Admitting: Nurse Practitioner

## 2021-07-18 ENCOUNTER — Encounter: Payer: Self-pay | Admitting: Nurse Practitioner

## 2021-07-18 ENCOUNTER — Other Ambulatory Visit: Payer: Self-pay

## 2021-07-18 VITALS — Resp 18 | Wt 144.2 lb

## 2021-07-18 DIAGNOSIS — R5381 Other malaise: Secondary | ICD-10-CM

## 2021-07-18 DIAGNOSIS — Z515 Encounter for palliative care: Secondary | ICD-10-CM

## 2021-07-18 NOTE — Progress Notes (Signed)
[facility] Consult Note Telephone: [phone]  Fax: [phone]    Date of encounter: 07/18/21 PATIENT NAME: Cory Harvey [place] [id number]   [phone] (home)  DOB: 1961-03-20 MRN: [id number] PRIMARY CARE PROVIDER:    Dr Cory Harvey [facility]  RESPONSIBLE PARTY:    Contact Information     Name Relation Home Work Mobile   Cory Harvey Mother   [phone]      I met face to face with patient in facility. Palliative Care was asked to follow this patient by consultation request of  Dr Cory Harvey to address advance care planning and complex medical decision making. This is a follow up visit.                                  ASSESSMENT AND PLAN / RECOMMENDATIONS:  Symptom Management/Plan: 1. ACP: DNR/MOST form in vynca   2. Debility secondary to CVA, encourage oob, restorative exercises; socialization;    3. Palliative Care encounter: Palliative medicine team will continue to support patient, patient's family, and medical team. Visit consisted of counseling and education dealing with the complex and emotionally intense issues of symptom management and palliative care in the setting of serious and potentially life-treating illness.  Follow up Palliative Care Visit: Palliative care will continue to follow for complex medical decision making, advance care planning, and clarification of goals. Return 8 weeks or prn.  I spent 40 minutes providing this consultation. More than 50% of the time in this consultation was spent in counseling and care coordination.  PPS: 40%  Chief Complaint: Follow up palliative consult for complex medical decision making  HISTORY OF PRESENT ILLNESS:  Cory Harvey is a 59 y.o. year old male  with multiple medical problems including CVA, right middle cerebral artery, hemiplegic, TIA, seizure disorder, speech disturbance, dysphasia, dysarthria, HTN, HLD,  depression, h/o falling, h/o cocaine abuse in remission, h/o tobacco abuse. Cory Harvey continues to reside at [place] at [facility]. Cory Harvey does transfer to the wheelchair and is able to sit up during the day. Cory Harvey does require assistance to be bathed and dressed as he wears adult diapers for incontinence. Cory. Harvey does feed himself after tray setup. Staff endorses appetite is fair with current weight 144.2. Staff endorses no other changes or concerns, no recent falls, wounds, hospitalizations, infections per staff. At present, Cory. Harvey is lying in bed, appears comfortable. No visitors present. Cory. Harvey and I talked about how his day was. Cory. Harvey was able to verbalize simple answers saying his day was good. No pain or sob. He declined getting oob. Cory. Harvey was cooperative with assessment, limited with answers due to cognitive impairment. Emotional support provided. Medical goals reviewed. I attempted to contact Cory. Harvey mother, no answer, message left. I updated staff, no new changes to goc  History obtained from review of EMR, discussion with facility staff and rMr. Harvey.  I reviewed available labs, medications, imaging, studies and related documents from the EMR.  Records reviewed and summarized above.   ROS Full 14 system review of systems performed and negative with exception of: as per HPI.   Physical Exam: Constitutional: NAD General: thin, pleasantly confused male EYES: a lids intact ENMT: oral mucous membranes moist CV: S1S2, RRR, no LE edema Pulmonary: LCTA, no increased work of breathing,  no cough, room air Abdomen: soft and non tender MSK: s/c, non-ambulatory Skin: warm and dry, no rashes or wounds on visible skin Neuro:  + generalized weakness,  + cognitive impairment Psych: non-anxious affect, Alert, oriented to self  Questions and concerns were addressed. The patient/family was encouraged to call with questions and/or  concerns. My business card was provided. Provided general support and encouragement, no other unmet needs identified   Thank you for the opportunity to participate in the care of Cory. Harvey.  The palliative care team will continue to follow. Please call our office at [phone] if we can be of additional assistance.   This chart was dictated using voice recognition software.  Despite best efforts to proofread,  errors can occur which can change the documentation meaning.   Cory Harvey Cory Gully, NP

## 2021-10-03 ENCOUNTER — Other Ambulatory Visit: Payer: Self-pay

## 2021-10-03 ENCOUNTER — Non-Acute Institutional Stay: Payer: Medicare Other | Admitting: Nurse Practitioner

## 2021-10-03 ENCOUNTER — Encounter: Payer: Self-pay | Admitting: Nurse Practitioner

## 2021-10-03 VITALS — BP 94/62 | HR 55 | Temp 97.7°F | Resp 18 | Wt 138.1 lb

## 2021-10-03 DIAGNOSIS — I639 Cerebral infarction, unspecified: Secondary | ICD-10-CM

## 2021-10-03 DIAGNOSIS — R630 Anorexia: Secondary | ICD-10-CM

## 2021-10-03 DIAGNOSIS — Z515 Encounter for palliative care: Secondary | ICD-10-CM

## 2021-10-03 DIAGNOSIS — R5381 Other malaise: Secondary | ICD-10-CM

## 2021-10-03 NOTE — Progress Notes (Signed)
[facility] Consult Note Telephone: [phone]  Fax: [phone]    Date of encounter: 10/03/21 4:44 PM PATIENT NAME: Cory Harvey [place] [address]   [phone] (home)  DOB: 1961/05/08 MRN: [id number] PRIMARY CARE PROVIDER:    Dr [facility] RESPONSIBLE PARTY:    Contact Information     Name Relation Home Work Mobile   [name] Mother   [phone]      I met face to face with patient in facility. Palliative Care was asked to follow this patient by consultation request of  Dr Nicole Kindred to address advance care planning and complex medical decision making. This is a follow up visit.                                  ASSESSMENT AND PLAN / RECOMMENDATIONS:  Symptom Management/Plan: 1. ACP: DNR/MOST form in vynca   2. Debility secondary to CVA, encourage oob, restorative exercises; socialization;    3. Palliative Care encounter: Palliative medicine team will continue to support patient, patient's family, and medical team. Visit consisted of counseling and education dealing with the complex and emotionally intense issues of symptom management and palliative care in the setting of serious and potentially life-treating illness.   4. 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 Regular diet, Level 4 - Pureed texture, Level 2 - Mildly Thick consistency. Continue to monitor weights, % meals.   Follow up Palliative Care Visit: Palliative care will continue to follow for complex medical decision making, advance care planning, and clarification of goals. Return 4 weeks or prn.  I spent 37 minutes providing this consultation starting at 4:00 pm. More than 50% of the time in this consultation was spent in counseling and care coordination.  PPS: 30%  Chief Complaint: Follow up palliative consult for complex medical  decision making  HISTORY OF PRESENT ILLNESS:  Cory Harvey is a 60 y.o. year old male  with multiple medical problems including CVA, right middle cerebral artery, hemiplegic, TIA, seizure disorder, speech disturbance, dysphasia, dysarthria, HTN, HLD, depression, h/o falling, h/o cocaine abuse in remission, h/o tobacco abuse. Cory Harvey continues to reside at [place] at [facility]. Cory Harvey does transfer to the wheelchair and is able to sit up during the day. Cory Harvey does require assistance to be bathed and dressed as he wears adult diapers for incontinence. Cory Harvey does feed himself after tray setup. Current weight 138.1 lbs with BMI 21.6 and 2lb weight gain. 09/29/2021 last Psychiatry note for vascular dementia with major depression prescribed escitalopram for mood. Insomnia prescribed trazodone. Last labs 07/01/2021. Staff endorses no recent wounds, falls, infections, hospitalizations. Overall decline functionally as he does not get oob, remains in bed. Cory Harvey does require more assistance with feeding. Overall cognitive decline, not as interactive per staff. No other changes. I visited and observed Cory Harvey. Cory Harvey did make eye contact, smiling. Cory Harvey was interactive though no meaningful discussion. Emotional support provided. Cory Harvey was cooperative with assessment. Medical goals reviewed. I attempted to contact Cory Harvey mother for further discussions goc, pc visit. I updated staff.   History obtained from review of EMR, discussion with facility staff and Cory. Clapper.  I reviewed available labs, medications, imaging, studies and related documents from the  EMR.  Records reviewed and summarized above.   ROS Full 10 system review of systems performed and negative with exception of: as per HPI.   Physical Exam: Constitutional: NAD General: frail appearing, thin, debilitated, confused male EYES: lids intact ENMT: oral mucous membranes  moist CV: S1S2, RRR Pulmonary: LCTA, no increased work of breathing, no cough, room air Abdomen: normo-active BS + 4 quadrants, soft and non tender MSK: bed-bound; functionally quadriplegic Skin: warm and dry Neuro:  + generalized weakness,  + cognitive impairment Psych: flat affect, A and Oriented to self  Questions and concerns were addressed. Provided general support and encouragement, no other unmet needs identified   Thank you for the opportunity to participate in the care of Cory. Keetch.  The palliative care team will continue to follow. Please call our office at [phone] if we can be of additional assistance.   This chart was dictated using voice recognition software.  Despite best efforts to proofread,  errors can occur which can change the documentation meaning.   Devynne Sturdivant Ihor Gully, NP

## 2021-11-18 ENCOUNTER — Encounter: Payer: Self-pay | Admitting: Nurse Practitioner

## 2021-11-18 ENCOUNTER — Non-Acute Institutional Stay: Payer: Medicare Other | Admitting: Nurse Practitioner

## 2021-11-18 VITALS — BP 116/64 | HR 74 | Temp 96.8°F | Resp 18 | Wt 143.5 lb

## 2021-11-18 NOTE — Progress Notes (Signed)
[facility] Consult Note Telephone: [phone]  Fax: [phone]    Date of encounter: 11/18/21 8:02 PM PATIENT NAME: Jeptha Hinnenkamp [place] [address]   [phone] (home)  DOB: 20-Oct-1961 MRN: [id number] PRIMARY CARE PROVIDER:    [facility]  RESPONSIBLE PARTY:    Contact Information     Name Relation Home Work Mobile   [name] Mother   [phone]      I met face to face with patient in facility. Palliative Care was asked to follow this patient by consultation request of  Dion Body, MD to address advance care planning and complex medical decision making. This is a follow up visit.                                  ASSESSMENT AND PLAN / RECOMMENDATIONS: Symptom Management/Plan: 1. ACP: DNR/MOST form in vynca   2. Debility secondary to CVA, encourage oob, restorative exercises; socialization;   3. Anorexia secondary to dementia with late onset CVA. Continue to monitor weights; encourage to eat, continue to monitor, supplements; will request labs to see albumin/total protein 08/16/2021 weight 132.1 lbs 09/20/2021 weight 138.1 lbs 10/21/2021 weight 147.5 lbs 11/17/2021 weight 143.5 lbs BMI 22.5  3. Palliative Care encounter: Palliative medicine team will continue to support patient, patient's family, and medical team. Visit consisted of counseling and education dealing with the complex and emotionally intense issues of symptom management and palliative care in the setting of serious and potentially life-treating illness.  Follow up Palliative Care Visit: Palliative care will continue to follow for complex medical decision making, advance care planning, and clarification of goals. Return 8 weeks or prn.  I spent 38 minutes providing this consultation. More than 50% of the time in this consultation was spent in counseling and care coordination. PPS: 30%  Chief Complaint:  Follow up palliative consult for complex medical decision making  HISTORY OF PRESENT ILLNESS:  Dmoni Fortson is a 60 y.o. year old male  with multiple medical problems including CVA, right middle cerebral artery, hemiplegic, TIA, seizure disorder, speech disturbance, dysphasia, dysarthria, HTN, HLD, depression, h/o falling, h/o cocaine abuse in remission, h/o tobacco abuse. Mr Sime continues to reside at [place] at [facility]. Mr Wenzlick does transfer to the wheelchair and is able to sit up during the day. Mr Gastelum does require assistance to be bathed and dressed as he wears adult diapers for incontinence. Mr. Ghosh requires assistance with feeding. Mr. Marcell is a DNR. At present, Mr. Stanzione is lying in bed, appears comfortable. No visitors present. I visited and observed Mr. Liller. Mr Koopmann does make eye contact. Mr. Walrond is able to answer simple questions, limited with cognitive impairment. Mr Lamp was cooperative with assessment. No new changes recommended to poc. I updated nursing staff, attempted to contact mother for update on pc visit  History obtained from review of EMR, discussion with facility staff and Mr. Knecht.  I reviewed available labs, medications, imaging, studies and related documents from the EMR.  Records reviewed and summarized above.   ROS Full 10 system review of systems performed and negative with exception of: as per HPI per staff  Physical Exam: Constitutional: NAD General: frail appearing, thin, confused male EYES:  lids intact ENMT: oral mucous membranes moist CV: S1S2, RRR Pulmonary: LCTA, no increased work of  breathing, no cough, room air Abdomen: normo-active BS + 4 quadrants, soft and non tender MSK: bed-bound; muscle wasting Skin: warm and dry Neuro:  + generalized weakness,  + cognitive impairment Psych: non-anxious affect, A and Oriented to self  Questions and concerns were addressed. Provided general  support and encouragement, no other unmet needs identified   Thank you for the opportunity to participate in the care of Mr. Cina.  The palliative care team will continue to follow. Please call our office at [phone] if we can be of additional assistance.   This chart was dictated using voice recognition software.  Despite best efforts to proofread,  errors can occur which can change the documentation meaning.   Yitzchak Kothari Ihor Gully, NP

## 2022-01-30 ENCOUNTER — Other Ambulatory Visit: Payer: Self-pay

## 2022-01-30 ENCOUNTER — Encounter: Payer: Self-pay | Admitting: Nurse Practitioner

## 2022-01-30 ENCOUNTER — Non-Acute Institutional Stay: Payer: Medicare Other | Admitting: Nurse Practitioner

## 2022-01-30 VITALS — BP 111/64 | HR 78 | Temp 98.1°F | Wt 145.0 lb

## 2022-01-30 DIAGNOSIS — I639 Cerebral infarction, unspecified: Secondary | ICD-10-CM

## 2022-01-30 DIAGNOSIS — R5381 Other malaise: Secondary | ICD-10-CM

## 2022-01-30 DIAGNOSIS — Z515 Encounter for palliative care: Secondary | ICD-10-CM

## 2022-01-30 DIAGNOSIS — R630 Anorexia: Secondary | ICD-10-CM

## 2022-01-30 NOTE — Progress Notes (Signed)
° ° °[facility] °Community Palliative Care Consult Note °Telephone: [phone]  °Fax: [phone]  ° ° °Date of encounter: 01/30/22 °2:38 PM °PATIENT NAME: Cory Harvey °[address] ° [place] [address]   °[phone] (home)  °DOB: 07/13/1961 °MRN: [id number] °PRIMARY CARE PROVIDER:    °[facility] ° °RESPONSIBLE PARTY:    °Contact Information   ° ° Name Relation Home Work Mobile  ° Smith, Jessie Mother   [phone]  ° °  ° °I met face to face with patient in /facility. Palliative Care was asked to follow this patient by consultation request of  [facility] to address advance care planning and complex medical decision making. This is a follow up visit.                                  °ASSESSMENT AND PLAN / RECOMMENDATIONS:  °Symptom Management/Plan: °1. ACP: DNR/MOST form in vynca °  °2. Debility secondary to CVA, encourage oob, restorative exercises; socialization;  °  °3. Palliative Care encounter: Palliative medicine team will continue to support patient, patient's family, and medical team. Visit consisted of counseling and education dealing with the complex and emotionally intense issues of symptom management and palliative care in the setting of serious and potentially life-treating illness. °  °4. 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 Regular diet, Level 4 - Pureed texture, Level 2 - Mildly Thick consistency. Continue to monitor weights, % meals.  °11/17/2021 weight 143.5 lbs °01/13/2022 wegiht 145 lbs ° °Follow up Palliative Care Visit: Palliative care will continue to follow for complex medical decision making, advance care planning, and clarification of goals. Return 8 weeks or prn. ° °I spent 47 minutes providing this consultation. More than 50% of the time in this consultation was spent in counseling and care coordination. °PPS: 40% ° °Chief Complaint: Follow up  palliative consult for complex medical decision making ° °HISTORY OF PRESENT ILLNESS:  Keena James Stockburger is a 60 y.o. year old male  with multiple medical problems including CVA, right middle cerebral artery, hemiplegic, TIA, seizure disorder, speech disturbance, dysphasia, dysarthria, HTN, HLD, depression, h/o falling, h/o cocaine abuse in remission, h/o tobacco abuse. Mr Birkeland continues to reside at [facility]. Mr Eagles does ambulate, other days w/c dependent.  Mr Kostka does require assistance to be bathed and dressed as he wears adult diapers for incontinence. Mr. Griffitts does feed himself after tray setup, with steady weight loss current weight 145 lbs.  Psychiatry to continue to follow. Mr. Pedraza does require more assistance with feeding. Overall cognitive decline, not as interactive per staff. No other changes. I visited and observed Mr. Mitschke lying in bed, appears comfortable. Mr. Metsker did make eye contact, smiling. Mr. Gillen was interactive though no meaningful discussion. Emotional support provided. Mr. Sooy was cooperative with assessment. Medical goals reviewed. I attempted to contact Mr. Gopaul mother for further discussions goc, pc visit. I updated staff.   ° °History obtained from review of EMR, discussion with facility staff and Mr. Bonifield.  °I reviewed available labs, medications, imaging, studies and related documents from the EMR.  Records reviewed and summarized above.  ° °ROS °10 point system reviewed with staff with Mr. Schiltz cognitively impaired all negative except HPI ° °Physical Exam: °Constitutional: NAD °General: frail appearing, thin,   confused male °EYES: lids intact °ENMT: oral mucous membranes moist °CV: S1S2, RRR, no LE edema °Pulmonary: LCTA, no increased work of breathing, no cough, room air °Abdomen:  normo-active BS + 4 quadrants, soft and non tender °MSK: ambulatory, +muscle wasting °Skin: warm and dry °Neuro:  +  generalized weakness,  + cognitive impairment °Psych: non-anxious affect, A and Oriented to self °Thank you for the opportunity to participate in the care of Mr. Khouri.  The palliative care team will continue to follow. Please call our office at [phone] if we can be of additional assistance.  ° ° Z , NP  ° °  °

## 2022-03-10 ENCOUNTER — Encounter: Payer: Self-pay | Admitting: Nurse Practitioner

## 2022-03-10 ENCOUNTER — Non-Acute Institutional Stay: Payer: Medicare Other | Admitting: Nurse Practitioner

## 2022-03-10 VITALS — BP 116/64 | HR 51 | Temp 97.6°F | Resp 18 | Wt 146.0 lb

## 2022-03-10 DIAGNOSIS — I639 Cerebral infarction, unspecified: Secondary | ICD-10-CM

## 2022-03-10 DIAGNOSIS — R5381 Other malaise: Secondary | ICD-10-CM

## 2022-03-10 DIAGNOSIS — R630 Anorexia: Secondary | ICD-10-CM

## 2022-03-10 DIAGNOSIS — Z515 Encounter for palliative care: Secondary | ICD-10-CM

## 2022-03-10 NOTE — Progress Notes (Addendum)
? ? ?Manufacturing engineer ?Community Palliative Care Consult Note ?Telephone: [phone]  ?Fax: [phone]  ? ? ?Date of encounter: 03/10/22 ?4:07 PM ?PATIENT NAME: Cory Harvey ?[place] [address]   ?[phone] (home)  ?DOB: [birth year] ?MRN: [id number] ?PRIMARY CARE PROVIDER:    ?Cory Harvey ? ?RESPONSIBLE PARTY:    ?Contact Information   ? ? Name Relation Home Work Mobile  ? Cory Harvey Mother   [phone]  ? ?  ? ?I met face to face with patient in facility. Palliative Care was asked to follow this patient by consultation request of  Cory Harvey to address advance care planning and complex medical decision making. This is a follow up visit.                                  ?ASSESSMENT AND PLAN / RECOMMENDATIONS:  ?Symptom Management/Plan: ?1. ACP: DNR/MOST form in vynca ?  ?2. Debility secondary to CVA, encourage oob, restorative exercises; socialization;  ?  ?3. Palliative Care encounter: Palliative medicine team will continue to support patient, patient's family, and medical team. Visit consisted of counseling and education dealing with the complex and emotionally intense issues of symptom management and palliative care in the setting of serious and potentially life-treating illness. ?  ?4. 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 Regular diet, Level 4 - Pureed texture, Level 2 - Mildly Thick consistency. Continue to monitor weights, % meals.  ?11/17/2021 weight 143.5 lbs ?01/13/2022 wegiht 145 lbs ?02/14/2022 weight 146 lbs ? ?5. Debility secondary to late onset CVA, with dementia, continue to ambulate and fall, encouraged to ask for assistance, high fall risk, staff placed fall matts by bed, bed in lowest position ?  ?Follow up Palliative Care Visit: Palliative care will continue to follow for complex medical decision making, advance care planning, and clarification  of goals. Return 8 weeks or prn. ? ?Follow up Palliative Care Visit: Palliative care will continue to follow for complex medical decision making, advance care planning, and clarification of goals. Return 8 weeks or prn. ? ?I spent 37 minutes providing this consultation starting at 10:15 am. More than 50% of the time in this consultation was spent in counseling and care coordination. ? ?PPS: 40% ? ?Chief Complaint: Follow up palliative consult for complex medical decision making ? ?HISTORY OF PRESENT ILLNESS:  Cory Harvey is a 61 y.o. year old male  with  multiple medical problems including CVA, right middle cerebral artery, hemiplegic, TIA, seizure disorder, speech disturbance, dysphasia, dysarthria, HTN, HLD, depression, h/o falling, h/o cocaine abuse in remission, h/o tobacco abuse. Cory Harvey continues to reside at [place] at [facility]. Cory Harvey does ambulate, with frequent falling despite staff requesting for his safety to wait for assistance. Cory Harvey does require assistance to be bathed and dressed as he wears adult diapers for incontinence. Cory. Harvey does feed himself after tray setup, with steady, weight 146 lbs.  Psychiatry to continue to follow. Cory. Harvey does require more assistance with feeding. Overall cognitive decline, not as interactive per staff. No other changes. I visited and observed Cory. Harvey lying in bed, appears comfortable. Cory. Harvey did make eye contact, smiling. Cory. Harvey was interactive though no meaningful discussion. Emotional support provided. Cory. Harvey was cooperative with assessment. Medical goals reviewed. I  attempted to contact Cory. Harvey mother for further discussions goc, pc visit. I updated staff.  ? ?History obtained from review of EMR, discussion with facility staff and Cory. Harvey.  ?I reviewed available labs, medications, imaging, studies and related documents from the EMR.  Records reviewed and summarized above.  ? ?ROS ?10  point system reviewed with staff as Cory. Harvey is cognitively impaired, all negative except HPI ? ?Physical Exam: ?Constitutional: NAD ?General: frail appearing, thin, cognitively impaired male ?EYES: lids intact ?ENMT: oral mucous membranes moist ?CV: S1S2, RRR ?Pulmonary: LCTA, no increased work of breathing, no cough,  ?Abdomen: intake 100%, normo-active BS + 4 quadrants, soft and non tender ?MSK: ambulatory ?Skin: warm and dry ?Neuro:  no generalized weakness,  + cognitive impairment ?Psych: non-anxious affect, A and Oriented to person ?Thank you for the opportunity to participate in the care of Cory. Harvey.  The palliative care team will continue to follow. Please call our office at [phone] if we can be of additional assistance.  ? ?Puja Caffey Z Rasha Ibe, NP   ?

## 2022-03-24 ENCOUNTER — Encounter: Payer: Self-pay | Admitting: Nurse Practitioner

## 2022-03-24 ENCOUNTER — Non-Acute Institutional Stay: Payer: Medicare Other | Admitting: Nurse Practitioner

## 2022-03-24 VITALS — BP 116/64 | HR 78 | Temp 98.0°F | Resp 18 | Wt 143.5 lb

## 2022-03-24 DIAGNOSIS — R5381 Other malaise: Secondary | ICD-10-CM

## 2022-03-24 DIAGNOSIS — Z515 Encounter for palliative care: Secondary | ICD-10-CM

## 2022-03-24 DIAGNOSIS — I639 Cerebral infarction, unspecified: Secondary | ICD-10-CM

## 2022-03-24 DIAGNOSIS — R630 Anorexia: Secondary | ICD-10-CM

## 2022-03-24 NOTE — Progress Notes (Addendum)
? ? ?Manufacturing engineer ?Community Palliative Care Consult Note ?Telephone: [phone]  ?Fax: [phone]  ? ? ?Date of encounter: 03/24/22 ?8:08 PM ?PATIENT NAME: Cory Harvey ?[place] [address]   ?[phone] (home)  ?DOB: [birth year] ?MRN: [id number] ?PRIMARY CARE PROVIDER:    ?[employer]  ? ?RESPONSIBLE PARTY:    ?Contact Information   ? ? Name Relation Home Work Mobile  ? [name] Mother   [phone]  ? ?  ? ?I met face to face with patient in facility. Palliative Care was asked to follow this patient by consultation request of  [place] to address advance care planning and complex medical decision making. This is a follow up visit.                                  ?ASSESSMENT AND PLAN / RECOMMENDATIONS:  ?Symptom Management/Plan: ?1. ACP: DNR/MOST form in vynca ?  ?2. Debility secondary to CVA, encourage oob, restorative exercises; socialization;  ?  ?3. Palliative Care encounter: Palliative medicine team will continue to support patient, patient's family, and medical team. Visit consisted of counseling and education dealing with the complex and emotionally intense issues of symptom management and palliative care in the setting of serious and potentially life-treating illness. ?  ?4. 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 Regular diet, Level 4 - Pureed texture, Level 2 - Mildly Thick consistency. Continue to monitor weights, % meals.  ?11/17/2021 weight 143.5 lbs ?01/13/2022 wegiht 145 lbs ?02/14/2022 weight 146 lbs ?03/15/2022 weight 143.5 lbs ?5. Debility secondary to late onset CVA, with dementia, continue to ambulate and fall, encouraged to ask for assistance, high fall risk, staff placed fall matts by bed, bed in lowest position ? ?Follow up Palliative Care Visit: Palliative care will continue to follow for complex medical decision making, advance care  planning, and clarification of goals. Return 4 weeks or prn. ? ?I spent 36 minutes providing this consultation. More than 50% of the time in this consultation was spent in counseling and care coordination. ?PPS: 40% ? ?Chief Complaint: Follow up palliative consult for complex medical decision making ? ?HISTORY OF PRESENT ILLNESS:  Cory Harvey is a 61 y.o. year old male  with multiple medical problems including CVA, right middle cerebral artery, hemiplegic, TIA, seizure disorder, speech disturbance, dysphasia, dysarthria, HTN, HLD, depression, h/o falling, h/o cocaine abuse in remission, h/o tobacco abuse. Cory Harvey continues to reside at [place] at [facility]. Cory Harvey does ambulate, with frequent falling despite staff requesting for his safety to wait for assistance. Cory Harvey does require assistance to be bathed and dressed as he wears adult diapers for incontinence. Cory. Harvey does feed himself after tray setup, prompting, assistance from staff.  Psychiatry to continue to follow. Cory. Harvey does require more assistance with feeding. Overall cognitive decline, not as interactive per staff. No other changes. I visited and observed Cory. Harvey lying in bed, eyes open, appears comfortable. Cory. Harvey did make eye contact, smiling. Cory. Harvey was interactive though no meaningful discussion. Cory. Harvey endorses he does ambulate. Discussed importance asking for help as fall risk. Cory. Harvey endorses "yes, yes". Most of PC visit Emotional support provided. Cory. Harvey was cooperative with assessment. Medical goals reviewed. I attempted to contact Cory. Harvey mother for further  discussions goc, pc visit. I updated staff.  ? ?History obtained from review of EMR, discussion with facility staff and Cory. Harvey.  ?I reviewed available labs, medications, imaging, studies and related documents from the EMR.  Records reviewed and summarized above.  ? ?ROS ?10 point system reviewed with  staff as Cory. Harvey is cognitively impaired all negative except HPI ? ?Physical Exam: ?Constitutional: NAD ?General: frail appearing, thin, confused male ?EYES:  lids intact ?ENMT:  oral mucous membranes moist ?CV: S1S2, RRR ?Pulmonary: LCTA, no increased work of breathing, no cough, room air ?Abdomen: normo-active BS + 4 quadrants, soft and non tender ?MSK: ambulatory; unsteady gait ?Skin: warm and dry ?Neuro:  no generalized weakness,  + cognitive impairment ?Psych: non-anxious affect, Alert ?Thank you for the opportunity to participate in the care of Cory. Harvey.  The palliative care team will continue to follow. Please call our office at [phone] if we can be of additional assistance.  ? ?Raykwon Hobbs Z Aleksandra Raben, NP   ?

## 2022-05-01 ENCOUNTER — Encounter: Payer: Self-pay | Admitting: Nurse Practitioner

## 2022-05-01 ENCOUNTER — Non-Acute Institutional Stay: Payer: Medicare Other | Admitting: Nurse Practitioner

## 2022-05-01 VITALS — BP 168/80 | HR 70 | Temp 97.3°F | Resp 18 | Wt 151.8 lb

## 2022-05-01 DIAGNOSIS — I639 Cerebral infarction, unspecified: Secondary | ICD-10-CM

## 2022-05-01 DIAGNOSIS — R5381 Other malaise: Secondary | ICD-10-CM

## 2022-05-01 DIAGNOSIS — R630 Anorexia: Secondary | ICD-10-CM

## 2022-05-01 DIAGNOSIS — Z515 Encounter for palliative care: Secondary | ICD-10-CM

## 2022-05-01 NOTE — Progress Notes (Signed)
[facility] Consult Note Telephone: [phone]  Fax: [phone]    Date of encounter: 05/01/22 1:47 PM PATIENT NAME: Joffre Lucks [place]  [address]   [phone] (home)  DOB: 1961-09-25 MRN: [id number] PRIMARY CARE PROVIDER:    [facility]  RESPONSIBLE PARTY:    Contact Information     Name Relation Home Work Mobile   [name] Mother   [phone]      I met face to face with patient in facility. Palliative Care was asked to follow this patient by consultation request of  [place] to address advance care planning and complex medical decision making. This is a follow up visit.                                  ASSESSMENT AND PLAN / RECOMMENDATIONS:  Symptom Management/Plan:  1. ACP: DNR/MOST form in vynca   2. Debility secondary to CVA, encourage oob, restorative exercises; socialization;    3. Palliative Care encounter: Palliative medicine team will continue to support patient, patient's family, and medical team. Visit consisted of counseling and education dealing with the complex and emotionally intense issues of symptom management and palliative care in the setting of serious and potentially life-treating illness.   4. 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 Regular diet, Level 4 - Pureed texture, Level 2 - Mildly Thick consistency. Continue to monitor weights, % meals.  11/17/2021 weight 143.5 lbs 01/13/2022 wegiht 145 lbs 02/14/2022 weight 146 lbs 03/15/2022 weight 143.5 lbs 04/13/2022 weight 151.8 lbs 5. Debility secondary to late onset CVA, with dementia, continue to ambulate and fall, encouraged to ask for assistance, high fall risk, staff placed fall matts by bed, bed in lowest position   Follow up Palliative Care Visit: Palliative care will continue to follow for complex medical  decision making, advance care planning, and clarification of goals. Return 4 weeks or prn.   I spent 46 minutes providing this consultation starting at 1:00 pm. More than 50% of the time in this consultation was spent in counseling and care coordination. PPS: 40%   Chief Complaint: Follow up palliative consult for complex medical decision making HISTORY OF PRESENT ILLNESS:  Alejos Reinhardt is a 61 y.o. year old male  with multiple medical problems including CVA, right middle cerebral artery, hemiplegic, TIA, seizure disorder, speech disturbance, dysphasia, dysarthria, HTN, HLD, depression, h/o falling, h/o cocaine abuse in remission, h/o tobacco abuse. Mr Spiering continues to reside at [place] at [facility]. Mr Amble does ambulate, with frequent falling despite staff requesting for his safety to wait for assistance. Mr Shamblin does require assistance to be bathed and dressed as he wears adult diapers for incontinence. Mr. Bolotin does feed himself after tray setup, prompting, assistance from staff.  Psychiatry to continue to follow. Mr. Norkus does require more assistance with feeding. Overall cognitive decline, not as interactive per staff. No other changes. I visited and observed Mr. Willert siting at the dining room table in the dining area. Mr. Beg did make eye contact, smiling, very interactive, asking and answering simple questions.  Mr. Feria was interactive though no meaningful discussion. Mr. Mash talked about eating, he would like chicken. Mr. Laws endorses he likes being out of his room, ROS, limited due to  cognitive impairment. Most of PC visit Emotional support provided. Mr. Szumski was cooperative with assessment. Medical goals reviewed. I contacted Ms. Tamala Julian, Mr [facility] mother, RP, clinical update discussed, PC visit, medical goals, cognitive, functional changes, overall disease progression. No new changes to goc, Ms. Tamala Julian endorses she was thankful  for [facility] visit and discussion. I updated staff.    History obtained from review of EMR, discussion with facility staff and Mr. Bushey.  I reviewed available labs, medications, imaging, studies and related documents from the EMR.  Records reviewed and summarized above.    ROS 10 point system reviewed with staff as Mr. Cockrell is cognitively impaired all negative except HPI   Physical Exam: Constitutional: NAD General: frail appearing, thin, pleasant male EYES:  lids intact ENMT:  oral mucous membranes moist CV: S1S2, RRR Pulmonary: LCTA, no increased work of breathing, no cough, room air Abdomen: soft and non tender MSK: ambulatory; unsteady gait Skin: warm and dry Neuro:  no generalized weakness,  + cognitive impairment Psych: non-anxious affect, Alert Thank you for the opportunity to participate in the care of Mr. Gee.  The palliative care team will continue to follow. Please call our office at [phone] if we can be of additional assistance.    Angelin Cutrone Ihor Gully, NP

## 2022-07-10 ENCOUNTER — Non-Acute Institutional Stay: Payer: Medicare Other | Admitting: Nurse Practitioner

## 2022-07-10 ENCOUNTER — Encounter: Payer: Self-pay | Admitting: Nurse Practitioner

## 2022-07-10 VITALS — BP 160/80 | HR 78 | Temp 97.0°F | Resp 18 | Wt 146.6 lb

## 2022-07-10 DIAGNOSIS — Z515 Encounter for palliative care: Secondary | ICD-10-CM

## 2022-07-10 DIAGNOSIS — I639 Cerebral infarction, unspecified: Secondary | ICD-10-CM

## 2022-07-10 DIAGNOSIS — R630 Anorexia: Secondary | ICD-10-CM

## 2022-07-10 DIAGNOSIS — R5381 Other malaise: Secondary | ICD-10-CM

## 2022-07-10 NOTE — Progress Notes (Signed)
[facility] Consult Note Telephone: [phone]  Fax: [phone]    Date of encounter: 07/10/22 4:14 PM PATIENT NAME: Cory Harvey Cory [place] [address]   [phone] (home)  DOB: 1961/07/04 MRN: [id number] PRIMARY CARE PROVIDER:    [facility]  RESPONSIBLE PARTY:    Contact Information     Name Relation Home Work Mobile   Cory Harvey Mother   [phone]       I met face to face with patient in facility. Palliative Care was asked to follow this patient by consultation request of  [place] to address advance care planning and complex medical decision making. This is a follow up visit.                                  ASSESSMENT AND PLAN / RECOMMENDATIONS:  Symptom Management/Plan:  1. ACP: DNR/MOST form in vynca   2. Debility secondary to CVA, encourage oob, restorative exercises; socialization;    3. Palliative Care encounter: Palliative medicine team will continue to support patient, patient's family, and medical team. Visit consisted of counseling and education dealing with the complex and emotionally intense issues of symptom management and palliative care in the setting of serious and potentially life-treating illness.   4. 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 Regular diet, Level 4 - Pureed texture, Level 2 - Mildly Thick consistency. Continue to monitor weights, % meals.  11/17/2021 weight 143.5 lbs 01/13/2022 wegiht 145 lbs 02/14/2022 weight 146 lbs 03/15/2022 weight 143.5 lbs 04/13/2022 weight 151.8 lbs 6.19/2023 weight 148 lbs 06/19/2022 weight 146.6 lbs 5. Debility secondary to late onset CVA, with dementia, continue to ambulate and fall, encouraged to ask for assistance, high fall risk, staff placed fall matts by bed, bed in lowest position   Follow up Palliative Care Visit: Palliative  care will continue to follow for complex medical decision making, advance care planning, and clarification of goals. Return 4 weeks or prn.   I spent 47 minutes providing this consultation starting at 3:45 pm. More than 50% of the time in this consultation was spent in counseling and care coordination. PPS: 40%   Chief Complaint: Follow up palliative consult for complex medical decision making HISTORY OF PRESENT ILLNESS:  Cory Harvey is a 61 y.o. year old male  with multiple medical problems including CVA, right middle cerebral artery, hemiplegic, TIA, seizure disorder, speech disturbance, dysphasia, dysarthria, HTN, HLD, depression, h/o falling, h/o cocaine abuse in remission, h/o tobacco abuse. Mr Cory Harvey continues to reside at [place] at [facility]. Mr Harvey does ambulate, with frequent falling despite staff requesting for his safety to wait for assistance. Mr Harvey does require assistance to be bathed and dressed as he wears adult diapers for incontinence. Cory Harvey does feed himself after tray setup, prompting, assistance from staff.  Psychiatry to continue to follow. Cory Harvey does require more assistance with feeding. Overall cognitive decline, not as interactive per staff. No other changes. I visited and observed Cory Harvey lying in bed. Cory Harvey did make eye contact, smiling, very interactive, asking and answering simple questions. Cory Harvey endorses "I have not fallen, I ask for help".  Cory Harvey was interactive though no meaningful discussion. Cory Harvey was cooperative with assessment. Medical goals reviewed. I  contacted Cory Harvey, Mr Cory Harvey mother, RP, message left to re-contact for update on pc visit. No new changes to goc, I updated staff.    History obtained from review of EMR, discussion with facility staff and Cory Harvey.  I reviewed available labs, medications, imaging, studies and related documents from the EMR.  Records reviewed and  summarized above.    ROS 10 point system reviewed with staff as Cory Harvey is cognitively impaired all negative except HPI   Physical Exam: Constitutional: NAD General: frail appearing, thin, pleasant male EYES:  lids intact ENMT:  oral mucous membranes moist CV: S1S2, RRR Pulmonary: LCTA, no increased work of breathing, no cough, room air Abdomen: soft and non tender MSK: ambulatory; unsteady gait Skin: warm and dry Neuro:  no generalized weakness,  + cognitive impairment Psych: non-anxious affect, Alert  Cory Kemler Ihor Gully, NP

## 2022-10-06 ENCOUNTER — Encounter: Payer: Self-pay | Admitting: Nurse Practitioner

## 2022-10-06 ENCOUNTER — Non-Acute Institutional Stay: Payer: Medicare Other | Admitting: Nurse Practitioner

## 2022-10-06 VITALS — BP 124/74 | HR 78 | Temp 97.1°F | Resp 18 | Wt 148.5 lb

## 2022-10-06 DIAGNOSIS — I639 Cerebral infarction, unspecified: Secondary | ICD-10-CM

## 2022-10-06 DIAGNOSIS — Z515 Encounter for palliative care: Secondary | ICD-10-CM

## 2022-10-06 DIAGNOSIS — R630 Anorexia: Secondary | ICD-10-CM

## 2022-10-06 DIAGNOSIS — R5381 Other malaise: Secondary | ICD-10-CM

## 2022-10-06 NOTE — Progress Notes (Signed)
[facility] Consult Note Telephone: [phone]  Fax: [phone]    Date of encounter: 10/06/22 1:10 PM PATIENT NAME: Zachory Mangual [place] [address]   [phone] (home)  DOB: [birth year] MRN: [id number] PRIMARY CARE PROVIDER:    Dion Body, MD,  [place] [address] [phone]  REFERRING PROVIDER:   Dion Body, MD [place] [facility] [place],  [place] [address] [phone]  RESPONSIBLE PARTY:    Contact Information     Name Relation Home Work Mobile   [name] Mother   [phone]     I met face to face with patient in facility. Palliative Care was asked to follow this patient by consultation request of  [place] to address advance care planning and complex medical decision making. This is a follow up visit.                                  ASSESSMENT AND PLAN / RECOMMENDATIONS:  Symptom Management/Plan:  1. ACP: DNR/MOST form in vynca   2. Debility secondary to CVA, encourage oob, restorative exercises; socialization;    3. Palliative Care encounter: Palliative medicine team will continue to support patient, patient's family, and medical team. Visit consisted of counseling and education dealing with the complex and emotionally intense issues of symptom management and palliative care in the setting of serious and potentially life-treating illness.   4. 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 Regular diet, Level 4 - Pureed texture, Level 2 - Mildly Thick consistency. Continue to monitor weights, % meals.  11/17/2021 weight 143.5 lbs 01/13/2022 wegiht 145 lbs 02/14/2022 weight 146 lbs 03/15/2022 weight 143.5 lbs 04/13/2022 weight 151.8 lbs 6.19/2023 weight 148 lbs 06/19/2022 weight 146.6 lbs 09/14/222 weight 148.5 lbs 5.  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 Return 4 weeks or prn.   I spent 37 minutes providing this consultation starting at 12:15 pm. More than 50% of the time in this consultation was spent in counseling and care coordination. PPS: 40%   Chief Complaint: Follow up palliative consult for complex medical decision making HISTORY OF PRESENT ILLNESS:  Yordan Martindale is a 61 y.o. year old male  with multiple medical problems including CVA, right middle cerebral artery, hemiplegic, TIA, seizure disorder, speech disturbance, dysphasia, dysarthria, HTN, HLD, depression, h/o falling, h/o cocaine abuse in remission, h/o tobacco abuse. Mr Quinley continues to reside at [place] at [facility]. Mr Barberi does require assistance for bathing, dressing. Mr. Gargis is able to transfer to w/c, staff endorses not ambulating. Staff endorses Mr. Carnell has been eating with good appetite. Weight gain. At present Mr. Stogdill is sitting in a w/c in the dining area being fed by staff. Mr. Bozzi appears comfortable. Mr. Porter was engaging, cooperative, difficulty answering questions due to cognitive impairment. Medical goals reviewed. Mr Tietze appears stable at present time, continue to follow with PC. Most of PC supportive. Staff updated.    History  obtained from review of EMR, discussion with facility staff and Mr. Ragone.  I reviewed available labs, medications, imaging, studies and related documents from the EMR.  Records reviewed and summarized above.    ROS 10 point system reviewed with staff as Mr. Piechota is cognitively impaired all negative except HPI   Physical  Exam: Constitutional: NAD General: frail appearing, thin, pleasant male EYES:  lids intact ENMT:  oral mucous membranes moist CV: S1S2, RRR Pulmonary: LCTA, no increased work of breathing, no cough, room air Abdomen: soft and non tender MSK: ambulatory; unsteady gait Skin: warm and dry Neuro:  no generalized weakness,  + cognitive impairment Psych: non-anxious affect, Alert  Thank you for the opportunity to participate in the care of Mr. Creppel.  The palliative care team will continue to follow. Please call our office at [phone] if we can be of additional assistance.   [place]asar Twilley Ihor Gully, NP

## 2022-11-22 ENCOUNTER — Encounter: Payer: Self-pay | Admitting: Nurse Practitioner

## 2022-11-22 ENCOUNTER — Non-Acute Institutional Stay: Payer: Medicare Other | Admitting: Nurse Practitioner

## 2022-11-22 VITALS — BP 106/64 | HR 78 | Temp 97.0°F | Resp 18 | Wt 155.0 lb

## 2022-11-22 DIAGNOSIS — R630 Anorexia: Secondary | ICD-10-CM

## 2022-11-22 DIAGNOSIS — I639 Cerebral infarction, unspecified: Secondary | ICD-10-CM

## 2022-11-22 DIAGNOSIS — Z515 Encounter for palliative care: Secondary | ICD-10-CM

## 2022-11-22 DIAGNOSIS — R5381 Other malaise: Secondary | ICD-10-CM

## 2022-11-22 NOTE — Progress Notes (Signed)
[facility] Consult Note Telephone: [phone]  Fax: [phone]    Date of encounter: 11/22/22 9:57 PM PATIENT NAME: Cory Harvey [id number]   [phone] (home)  DOB: Jan 20, 1961 MRN: [id number] PRIMARY CARE PROVIDER:    [facility]  RESPONSIBLE PARTY:    Contact Information     Name Relation Home Work Mobile   [name] Mother   [phone]     I met face to face with patient in facility. Palliative Care was asked to follow this patient by consultation request of  [facility] [place] to address advance care planning and complex medical decision making. This is a follow up visit.                                  ASSESSMENT AND PLAN / RECOMMENDATIONS:  Symptom Management/Plan:  1. ACP: DNR/MOST form in vynca   2. Debility secondary to CVA, encourage oob, restorative exercises; socialization;    3. Palliative Care encounter: Palliative medicine team will continue to support patient, patient's family, and medical team. Visit consisted of counseling and education dealing with the complex and emotionally intense issues of symptom management and palliative care in the setting of serious and potentially life-treating illness.   4. 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 Regular diet, Level 4 - Pureed texture, Level 2 - Mildly Thick consistency. Continue to monitor weights, % meals.  11/17/2021 weight 143.5 lbs 01/13/2022 wegiht 145 lbs 02/14/2022 weight 146 lbs 03/15/2022 weight 143.5 lbs 04/13/2022 weight 151.8 lbs 6.19/2023 weight 148 lbs 06/19/2022 weight 146.6 lbs 09/14/222 weight 148.5 lbs 11/16/2022 weight 155 lbs 5. Debility secondary to late onset CVA, with dementia, continue to ambulate and fall, encouraged to ask for assistance, high fall risk, staff placed fall matts by bed, bed in lowest  position   Follow up Palliative Care Visit: Palliative care will continue to follow for complex medical decision making, advance care planning, and clarification of goals. Return 4 to 8 weeks or prn.   I spent 45 minutes providing this consultation. More than 50% of the time in this consultation was spent in counseling and care coordination. PPS: 40%   Chief Complaint: Follow up palliative consult for complex medical decision making HISTORY OF PRESENT ILLNESS:  Cory Harvey is a 61 y.o. year old male  with multiple medical problems including CVA, right middle cerebral artery, hemiplegic, TIA, seizure disorder, speech disturbance, dysphasia, dysarthria, HTN, HLD, depression, h/o falling, h/o cocaine abuse in remission, h/o tobacco abuse. Cory Harvey continues to reside at [place] at [facility]. Cory Harvey does require assistance for bathing, dressing. Cory. Harvey is able to transfer to w/c, staff endorses not ambulating but able to propel his w/c with his feet. Staff endorses no recent falls, wounds, infections, hospitalizations. Cory. Harvey has been eating with good appetite, continues to gain weight. No s/s aspiration. No concerns per staff.  At present Cory. Harvey is sitting in a w/c in the hallway in his w/c, smiling, engaging, very interactive asking questions though limited with cognitive impairment.. Cory. Harvey appears comfortable. Cory. Harvey was engaging, cooperative, difficulty answering questions due to cognitive impairment. Medical goals, medications and poc reviewed. Cory Harvey appears stable at present time, continue to follow with PC. Most of PC  supportive. Staff updated. I have attempted to contact Cory Harvey mom for update on pc visit. Most pc visit supportive   History obtained from review of EMR, discussion with facility staff and Cory. Harvey.  I reviewed available labs, medications, imaging, studies and related documents from the EMR.  Records  reviewed and summarized above.    ROS 10 point system reviewed with staff as Cory Harvey is cognitively impaired all negative except HPI   Physical Exam: Constitutional: NAD General: frail appearing, thin, pleasant male EYES:  lids intact ENMT:  oral mucous membranes moist CV: S1S2, RRR Pulmonary: LCTA, no increased work of breathing, no cough, room air Abdomen: soft and non tender MSK: ambulatory; unsteady gait Skin: warm and dry Neuro:  no generalized weakness,  + cognitive impairment Psych: non-anxious affect, Alert  Thank you for the opportunity to participate in the care of Cory Harvey. Please call our office at [phone] if we can be of additional assistance.   Cory Grimaldo Ihor Gully, NP

## 2022-12-21 ENCOUNTER — Encounter: Payer: Self-pay | Admitting: Nurse Practitioner

## 2022-12-21 ENCOUNTER — Non-Acute Institutional Stay: Payer: Medicare Other | Admitting: Nurse Practitioner

## 2022-12-21 VITALS — BP 130/86 | HR 78 | Temp 97.1°F | Resp 18 | Wt 153.0 lb

## 2022-12-21 DIAGNOSIS — I639 Cerebral infarction, unspecified: Secondary | ICD-10-CM

## 2022-12-21 DIAGNOSIS — R5381 Other malaise: Secondary | ICD-10-CM

## 2022-12-21 DIAGNOSIS — R630 Anorexia: Secondary | ICD-10-CM

## 2022-12-21 DIAGNOSIS — Z515 Encounter for palliative care: Secondary | ICD-10-CM

## 2022-12-21 NOTE — Progress Notes (Signed)
Cory Harvey: [phone]  Fax: [phone]    Date of encounter: 12/21/22 5:08 PM PATIENT NAME: Cory Harvey [id number]   [phone] (home)  DOB: 1961-08-18 MRN: [id number] PRIMARY CARE PROVIDER:    [facility]  RESPONSIBLE PARTY:    Contact Information     Name Relation Home Work Mobile   Cory Harvey Mother   [phone]     I met face to face with patient in facility. Palliative Care was asked to follow this patient by consultation request of  Cory Harvey to address advance care planning and complex Cory decision making. This is a follow up visit.                                  ASSESSMENT AND PLAN / RECOMMENDATIONS:  Symptom Management/Plan:  1. ACP: DNR/MOST form in vynca   2. Debility secondary to CVA, encourage oob, restorative exercises; socialization;    3. Palliative Care encounter: Palliative medicine team will continue to support patient, patient's family, and Cory team. Visit consisted of counseling and education dealing with the complex and emotionally intense issues of symptom management and palliative care in the setting of serious and potentially life-treating illness.   4. 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 Regular diet, Level 4 - Pureed texture, Level 2 - Mildly Thick consistency. Continue to monitor weights, % meals.  11/17/2021 weight 143.5 lbs 01/13/2022 wegiht 145 lbs 02/14/2022 weight 146 lbs 03/15/2022 weight 143.5 lbs 04/13/2022 weight 151.8 lbs 6.19/2023 weight 148 lbs 06/19/2022 weight 146.6 lbs 09/14/222 weight 148.5 lbs 11/16/2022 weight 155 lbs 12/13/2022 weight 153 lbs 2 lbs weight loss 5. Debility secondary to late onset CVA, with dementia, continue to ambulate and fall, encouraged to ask for assistance, high fall risk, staff  placed fall matts by bed, bed in lowest position   Follow up Palliative Care Visit: Palliative care will continue to follow for complex Cory decision making, advance care planning, and clarification of goals. Return 4 to 8 weeks or prn.   I spent 35 minutes providing this consultation starting at 12:41 pm. More than 50% of the time in this consultation was spent in counseling and care coordination. PPS: 40%   Chief Complaint: Follow up palliative consult for complex Cory decision making HISTORY OF PRESENT ILLNESS:  Cory Harvey is a 62 y.o. year old male  with multiple Cory problems including CVA, right middle cerebral artery, hemiplegic, TIA, seizure disorder, speech disturbance, dysphasia, dysarthria, HTN, HLD, depression, h/o falling, h/o cocaine abuse in remission, h/o tobacco abuse. Mr Cory Harvey continues to reside at [facility]. Mr Cory Harvey does require assistance for bathing, dressing. Mr. Cory Harvey is able to transfer to w/c, staff endorses not ambulating but able to propel his w/c with his feet. Staff endorses no recent falls, wounds, infections, hospitalizations. Mr. Cory Harvey has been eating with good appetite, continues to gain weight. No s/s aspiration. No concerns per staff.  At present Mr. Cory Harvey is sitting in a w/c in the dining area, getting ready to eat lunch.  "I like to eat", "I am ready to eat". "I wonder what I am going to eat". "I really like to eat". Mr. Cory Harvey appears comfortable. Mr. Cory Harvey was engaging, cooperative, difficulty  answering questions due to cognitive impairment. Mr Cory Harvey endorses his mother did recently visit him. He talked about that visit. Cory goals, medications and poc reviewed. Mr Cory Harvey appears stable at present time, continue to follow with PC. Most of PC supportive. Staff updated. I have attempted to contact Mr Cory Harvey mom for update on pc visit. Most pc visit supportive   History obtained from review  of EMR, discussion with facility staff and Mr. Cory Harvey.  I reviewed available labs, medications, imaging, studies and related documents from the EMR.  Records reviewed and summarized above.    Physical Exam: Constitutional: NAD General: debilitated pleasant male ENMT:  oral mucous membranes moist CV: S1S2, RRR Pulmonary: LCTA, no increased work of breathing, no cough, room air Abdomen: soft and non tender Skin: warm and dry Neuro:  no generalized weakness,  + cognitive impairment Psych: non-anxious affect, Alert Thank you for the opportunity to participate in the care of Mr. Cory Harvey. Please call our office at [phone] if we can be of additional assistance.   Cory Harvey Ihor Gully, NP

## 2023-02-05 ENCOUNTER — Encounter: Payer: Self-pay | Admitting: Nurse Practitioner

## 2023-02-05 ENCOUNTER — Non-Acute Institutional Stay: Payer: Medicare Other | Admitting: Nurse Practitioner

## 2023-02-05 VITALS — BP 112/57 | HR 78 | Temp 97.1°F | Resp 18 | Wt 148.5 lb

## 2023-02-05 DIAGNOSIS — R5381 Other malaise: Secondary | ICD-10-CM

## 2023-02-05 DIAGNOSIS — I639 Cerebral infarction, unspecified: Secondary | ICD-10-CM

## 2023-02-05 DIAGNOSIS — R630 Anorexia: Secondary | ICD-10-CM

## 2023-02-05 DIAGNOSIS — Z515 Encounter for palliative care: Secondary | ICD-10-CM

## 2023-02-05 NOTE — Progress Notes (Signed)
[facility] Consult Note Telephone: [phone]  Fax: [phone]    Date of encounter: 02/05/23 1:08 PM PATIENT NAME: Cory Harvey [place] [address]   [phone] (home)  DOB: 07-11-1961 MRN: [id number] PRIMARY CARE PROVIDER:    [facility]  RESPONSIBLE PARTY:    Contact Information     Name Relation Home Work Mobile   Cory Harvey Mother   [phone]     I met face to face with patient in facility. Palliative Care was asked to follow this patient by consultation request of  Cory Harvey to address advance care planning and complex medical decision making. This is a follow up visit.                                  ASSESSMENT AND PLAN / RECOMMENDATIONS:  Symptom Management/Plan:  1. ACP: DNR/MOST form in vynca   2. Debility secondary to CVA, encourage oob, restorative exercises; socialization;    3. Palliative Care encounter: Palliative medicine team will continue to support patient, patient's family, and medical team. Visit consisted of counseling and education dealing with the complex and emotionally intense issues of symptom management and palliative care in the setting of serious and potentially life-treating illness.   4. 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 Regular diet, Level 4 - Pureed texture, Level 2 - Mildly Thick consistency. Continue to monitor weights, % meals.  09/14/222 weight 148.5 lbs 11/16/2022 weight 155 lbs 12/13/2022 weight 153 lbs 01/17/2023 weight 148.5 lbs 5. Debility secondary to late onset CVA, with dementia, continue to ambulate and fall, encouraged to ask for assistance, high fall risk, staff placed fall matts by bed, bed in lowest position   Follow up Palliative Care Visit: PC f/u visit further discussion monitor trends of appetite, weights, monitor for functional,  cognitive decline with chronic disease progression, assess any active symptoms, supportive role.Palliative care will continue to follow for complex medical decision making, advance care planning, and clarification of goals. Return 4 to 8 weeks or prn.   I spent 45 minutes providing this consultation starting at 12:30 pm. More than 50% of the time in this consultation was spent in counseling and care coordination. PPS: 40%   Chief Complaint: Follow up palliative consult for complex medical decision making HISTORY OF PRESENT ILLNESS:  Cory Harvey is a 62 y.o. year old male  with multiple medical problems including CVA, right middle cerebral artery, hemiplegic, TIA, seizure disorder, speech disturbance, dysphasia, dysarthria, HTN, HLD, depression, h/o falling, h/o cocaine abuse in remission, h/o tobacco abuse. Cory Harvey continues to reside at [place] at [facility]. Cory Harvey does require assistance for bathing, dressing. Cory. Harvey is able to transfer to w/c, staff endorses not ambulating but able to propel his w/c with his feet. Staff endorses no recent falls, wounds, infections, hospitalizations. Cory. Harvey has been eating, feeding himself at times. Purpose of today PC f/u visit further discussion monitor trends of appetite, weights, monitor for functional, cognitive decline with chronic disease progression, assess any active symptoms, supportive role.I visited and observed Cory Harvey feeding himself in the dining room at the table with other residents. Cory Harvey does make eye contact, answers simple questions, ros. Cory Harvey words were clear today, able to hold a brief  discussion through limited with cognitive impairment. Most discussion about food, appetite, things he likes to do. Attempted to talk about daily routine though difficulty following. Cory Harvey was smiling throughout pc visit, praised for being oob. Support provided. Medications, goc, poc reviewed,  continue to encourage Cory Harvey to feed himself, encourage him to eat. Updated staff.    History obtained from review of EMR, discussion with facility staff and Cory. Harvey.  I reviewed available labs, medications, imaging, studies and related documents from the EMR.  Records reviewed and summarized above.    Physical Exam: General: debilitated pleasant male, engaging ENMT:  oral mucous membranes moist CV: S1S2, RRR Pulmonary: LCTA Neuro:  + generalized weakness,  + cognitive impairment Psych: non-anxious affect, Alert Thank you for the opportunity to participate in the care of Cory. Harvey. Please call our office at [phone] if we can be of additional assistance.   Ryleeann Urquiza Ihor Gully, NP

## 2023-02-21 ENCOUNTER — Non-Acute Institutional Stay: Payer: Medicare Other | Admitting: Nurse Practitioner

## 2023-02-21 ENCOUNTER — Encounter: Payer: Self-pay | Admitting: Nurse Practitioner

## 2023-02-21 VITALS — BP 112/57 | HR 79 | Temp 97.1°F | Resp 18 | Wt 147.0 lb

## 2023-02-21 DIAGNOSIS — R630 Anorexia: Secondary | ICD-10-CM

## 2023-02-21 DIAGNOSIS — R5381 Other malaise: Secondary | ICD-10-CM

## 2023-02-21 DIAGNOSIS — Z515 Encounter for palliative care: Secondary | ICD-10-CM

## 2023-02-21 DIAGNOSIS — I639 Cerebral infarction, unspecified: Secondary | ICD-10-CM

## 2023-02-21 NOTE — Progress Notes (Signed)
[facility] Consult Note Telephone: [phone]  Fax: [phone]    Date of encounter: 02/21/23 7:10 PM PATIENT NAME: Cory Harvey [id number]   [phone] (home)  DOB: 03/27/1961 MRN: [id number] PRIMARY CARE PROVIDER:    [facility]  RESPONSIBLE PARTY:    Contact Information     Name Relation Home Work Mobile   Cory Harvey Mother   [phone]     I met face to face with patient in facility. Palliative Care was asked to follow this patient by consultation request of  [facility] to address advance care planning and complex medical decision making. This is a follow up visit.                                  ASSESSMENT AND PLAN / RECOMMENDATIONS:  Symptom Management/Plan:  1. ACP: DNR/MOST form in vynca   2. Debility secondary to CVA, encourage oob, restorative exercises; socialization;    3. Palliative Care encounter: Palliative medicine team will continue to support patient, patient's family, and medical team. Visit consisted of counseling and education dealing with the complex and emotionally intense issues of symptom management and palliative care in the setting of serious and potentially life-treating illness.   4. 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 Regular diet, Level 4 - Pureed texture, Level 2 - Mildly Thick consistency. Continue to monitor weights, % meals.   11/16/2022 weight 155 lbs 12/13/2022 weight 153 lbs 01/17/2023 weight 148.5 lbs 02/16/2023 weight 147 lbs 8 lbs/3 months; 5.16% 5. Debility secondary to late onset CVA, with dementia, continue to ambulate and fall, encouraged to ask for assistance, high fall risk, staff placed fall matts by bed, bed in lowest position   Follow up Palliative Care Visit: PC f/u visit further discussion monitor trends of appetite, weights,  monitor for functional, cognitive decline with chronic disease progression, assess any active symptoms, supportive role.Palliative care will continue to follow for complex medical decision making, advance care planning, and clarification of goals. Return 4 to 8 weeks or prn.   I spent 47 minutes providing this consultation starting at 1:00 pm. More than 50% of the time in this consultation was spent in counseling and care coordination. PPS: 40%   Chief Complaint: Follow up palliative consult for complex medical decision making HISTORY OF PRESENT ILLNESS:  Cory Harvey is a 62 y.o. year old male  with multiple medical problems including CVA, right middle cerebral artery, hemiplegic, TIA, seizure disorder, speech disturbance, dysphasia, dysarthria, HTN, HLD, depression, h/o falling, h/o cocaine abuse in remission, h/o tobacco abuse. Cory Harvey continues to reside at [place] at [facility]. Cory Harvey does require assistance for bathing, dressing. Cory. Harvey is able to transfer to w/c, staff endorses not ambulating but able to propel his w/c with his feet. Staff endorses no recent fwounds, infections, hospitalizations. Cory. Harvey has been eating, feeding himself at times. Purpose of today PC f/u visit further discussion monitor trends of appetite, weights, monitor for functional, cognitive decline with chronic disease progression, assess any active symptoms, supportive role.I visited and observed Cory Harvey sitting in the w/c in the dining area. Cory Harvey was engaging, interactive, saying multiple words sentences. Answering simple questions. Cory Harvey was cooperative with assessment. We talked about  food, which is his favorite topic. Cory Harvey was very proud to share he fed himself self today. Positive motivation and encouragement with today pc visit. Encouraged Cory Harvey to stay oob in w/c, attend activities participate as able, social interactions, sitting in front of  the nurses station. With cognitive impairment, limited ability. Cory Harvey is stable currently. ROS, medications, poc, goc reviewed. Ongoing weight loss, though not >10%, will continue to monitor and follow close.  I attempted to contact Cory Harvey mother with update. Updated staff, no new changes to poc.    History obtained from review of EMR, discussion with facility staff and Cory. Harvey.  I reviewed available labs, medications, imaging, studies and related documents from the EMR.  Records reviewed and summarized above.    Physical Exam: General: debilitated pleasant male ENMT:  oral mucous membranes moist CV: S1S2, RRR Pulmonary: LCTA Neuro:  + generalized weakness,  + cognitive impairment Psych: non-anxious affect, Alert Thank you for the opportunity to participate in the care of Cory. Harvey. Please call our office at [phone] if we can be of additional assistance.   Phillis Thackeray Ihor Gully, NP

## 2023-03-30 ENCOUNTER — Non-Acute Institutional Stay: Payer: Medicare Other | Admitting: Nurse Practitioner

## 2023-03-30 ENCOUNTER — Encounter: Payer: Self-pay | Admitting: Nurse Practitioner

## 2023-03-30 VITALS — BP 127/78 | HR 83 | Temp 97.2°F | Resp 18 | Wt 154.5 lb

## 2023-03-30 DIAGNOSIS — Z515 Encounter for palliative care: Secondary | ICD-10-CM

## 2023-03-30 DIAGNOSIS — I639 Cerebral infarction, unspecified: Secondary | ICD-10-CM

## 2023-03-30 DIAGNOSIS — R630 Anorexia: Secondary | ICD-10-CM

## 2023-03-30 DIAGNOSIS — R5381 Other malaise: Secondary | ICD-10-CM

## 2023-03-30 NOTE — Progress Notes (Signed)
[facility] Consult Note Telephone: [phone]  Fax: [phone]    Date of encounter: 03/30/23 5:23 PM PATIENT NAME: Cory Harvey [address] [place] [address]   [phone] (home)  DOB: 1960-12-18 MRN: [id number] PRIMARY CARE PROVIDER:    [facility]  RESPONSIBLE PARTY:    Contact Information     Name Relation Home Work Mobile   Waymon Budge Mother   [phone]     I met face to face with patient in facility. Palliative Care was asked to follow this patient by consultation request of  [facility] to address advance care planning and complex medical decision making. This is a follow up visit.                                  ASSESSMENT AND PLAN / RECOMMENDATIONS:  Symptom Management/Plan:  1. ACP: DNR/MOST form in vynca   2. Debility secondary to CVA, encourage oob, restorative exercises; socialization;    3. Palliative Care encounter: Palliative medicine team will continue to support patient, patient's family, and medical team. Visit consisted of counseling and education dealing with the complex and emotionally intense issues of symptom management and palliative care in the setting of serious and potentially life-treating illness.   4. 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 Regular diet, Level 4 - Pureed texture, Level 2 - Mildly Thick consistency. Continue to monitor weights, % meals.    11/16/2022 weight 155 lbs 12/13/2022 weight 153 lbs 01/17/2023 weight 148.5 lbs 02/16/2023 weight 147 lbs 03/14/2023 weight 154.5 lbs 5. Debility secondary to late onset CVA, with dementia, continue to ambulate and fall, encouraged to ask for assistance, high fall risk, staff placed fall matts by bed, bed in lowest position   Follow up Palliative Care Visit: PC f/u visit further discussion monitor trends of appetite, weights, monitor  for functional, cognitive decline with chronic disease progression, assess any active symptoms, supportive role.Palliative care will continue to follow for complex medical decision making, advance care planning, and clarification of goals. Return 4 to 8 weeks or prn.   I spent 45 minutes providing this consultation. More than 50% of the time in this consultation was spent in counseling and care coordination. PPS: 40%   Chief Complaint: Follow up palliative consult for complex medical decision making HISTORY OF PRESENT ILLNESS:  Cory Harvey is a 62 y.o. year old male  with multiple medical problems including CVA, right middle cerebral artery, hemiplegic, TIA, seizure disorder, speech disturbance, dysphasia, dysarthria, HTN, HLD, depression, h/o falling, h/o cocaine abuse in remission, h/o tobacco abuse. Cory Harvey continues to reside at [facility]. Cory Harvey does require assistance for bathing, dressing. Cory. Harvey is able to transfer to w/c, staff endorses not ambulating but able to propel his w/c with his feet. Staff endorses no recent fwounds, infections, hospitalizations. Cory. Harvey has been eating, feeding himself at times. Purpose of today PC f/u visit further discussion monitor trends of appetite, weights, monitor for functional, cognitive decline with chronic disease progression, assess any active symptoms, supportive role.I visited and observed Cory Harvey sitting in the w/c in the in the hall, propelling himself with his feet. Engaging, talkative, smiling. Cory Harvey talked about going to activities, propelling in the hall and food, though limited with cognitive  impairment. We talked about his mom. Cory Harvey made eye contact, smiling, engaging throughout visit. Cory Harvey was cooperative, asked questions, very chatty. Support provided. Cory Harvey is currently stable, talked about appetite, foods he enjoys. What he likes to do with daily routine,  "going to activities", "eating". Medications, goc, ros reviewed. I attempted to contact Cory Harvey mother with update. Updated staff, no new changes to poc.    History obtained from review of EMR, discussion with facility staff and Cory. Harvey.  I reviewed available labs, medications, imaging, studies and related documents from the EMR.  Records reviewed and summarized above.    Physical Exam: General: debilitated pleasant male ENMT:  oral mucous membranes moist CV: S1S2, RRR Pulmonary: LCTA Neuro:  + generalized weakness,  + cognitive impairment Psych: non-anxious affect, Alert, oriented to self, answering questions  Thank you for the opportunity to participate in the care of Cory. Harvey. Please call our office at [phone] if we can be of additional assistance.   Daya Dutt [place], NP

## 2023-05-01 ENCOUNTER — Encounter: Payer: Self-pay | Admitting: Nurse Practitioner

## 2023-05-01 ENCOUNTER — Non-Acute Institutional Stay: Payer: Medicare Other | Admitting: Nurse Practitioner

## 2023-05-01 VITALS — BP 127/78 | HR 83 | Temp 97.2°F | Resp 18 | Wt 155.0 lb

## 2023-05-01 DIAGNOSIS — R630 Anorexia: Secondary | ICD-10-CM

## 2023-05-01 DIAGNOSIS — R5381 Other malaise: Secondary | ICD-10-CM

## 2023-05-01 DIAGNOSIS — Z515 Encounter for palliative care: Secondary | ICD-10-CM

## 2023-05-01 DIAGNOSIS — I639 Cerebral infarction, unspecified: Secondary | ICD-10-CM

## 2023-05-01 NOTE — Progress Notes (Addendum)
Therapist, nutritional Palliative Care Consult Note Telephone: [phone]  Fax: [phone]    Date of encounter: 05/01/23 2:23 PM PATIENT NAME: Cory Harvey [address] [place] [address]   [phone] (home)  DOB: March 07, 1961 MRN: [id number] PRIMARY CARE PROVIDER:    [facility]  RESPONSIBLE PARTY:    Contact Information     Name Relation Home Work Mobile   Waymon Budge Mother   [phone]     I met face to face with patient in facility. Palliative Care was asked to follow this patient by consultation request of  [facility] to address advance care planning and complex medical decision making. This is a follow up visit.                                  ASSESSMENT AND PLAN / RECOMMENDATIONS:  Symptom Management/Plan:  1. ACP: DNR/MOST form in vynca   2. Debility secondary to CVA, encourage oob, restorative exercises; socialization;    3. Palliative Care encounter: Palliative medicine team will continue to support patient, patient's family, and medical team. Visit consisted of counseling and education dealing with the complex and emotionally intense issues of symptom management and palliative care in the setting of serious and potentially life-treating illness.   4. 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 Continue to monitor weights, % meals.    11/16/2022 weight 155 lbs 12/13/2022 weight 153 lbs 01/17/2023 weight 148.5 lbs 02/16/2023 weight 147 lbs 03/14/2023 weight 154.5 lbs 04/11/2023 weight 155 lbs 5. Debility secondary to late onset CVA, with dementia, continue to ambulate and fall, encouraged to ask for assistance, high fall risk, staff placed fall matts by bed, bed in lowest position   Follow up Palliative Care Visit: PC f/u visit further discussion monitor trends of appetite, weights, monitor for functional, cognitive decline with chronic  disease progression, assess any active symptoms, supportive role.Palliative care will continue to follow for complex medical decision making, advance care planning, and clarification of goals. Return 2 to 8 weeks or prn.   I spent 45 minutes providing this consultation. More than 50% of the time in this consultation was spent in counseling and care coordination. PPS: 40%   Chief Complaint: Follow up palliative consult for complex medical decision making HISTORY OF PRESENT ILLNESS:  Cory Harvey is a 62 y.o. year old male  with multiple medical problems including CVA, right middle cerebral artery, hemiplegic, TIA, seizure disorder, speech disturbance, dysphasia, dysarthria, HTN, HLD, depression, h/o falling, h/o cocaine abuse in remission, h/o tobacco abuse. Cory Harvey continues to reside at [facility]. Cory Harvey does require assistance for bathing, dressing. Cory. Harvey is able to transfer to w/c, staff endorses not ambulating but able to propel his w/c with his feet. Staff endorses no recent fwounds, infections, hospitalizations. Cory. Harvey has been eating, feeding himself at times. Purpose of today PC f/u visit further discussion monitor trends of appetite, weights, monitor for functional, cognitive decline with chronic disease progression, assess any active symptoms, supportive role.I visited and observed Cory Harvey sitting in the w/c in the in the hall, propelling himself with his feet. We talked about food, appetite, his favorite topic, daily routine, how he has been feeling, talked about his mother. We talked about activities he has been going too,  praised for being oob in w/c with increase in mobility. Limited discussion with cognitive impairment though was very chatty, engaging today even asking questions. Support provided. Cory Harvey was cooperative with assessment. Medications, goc, poc reviewed. I called [name], Cory Harvey mother, clinical  update given, talked about pc visit, goc, weights, currently Cory Harvey is stable, chronic disease, daily routine, quality of life. Support provided. PC f/u visit further discussion monitor trends of appetite, weights, monitor for functional, cognitive decline with chronic disease progression, assess any active symptoms, supportive role.   History obtained from review of EMR, discussion with facility staff and Cory. Harvey.  I reviewed available labs, medications, imaging, studies and related documents from the EMR.  Records reviewed and summarized above.    Physical Exam: General: debilitated pleasant male ENMT:  oral mucous membranes moist CV: S1S2, RRR Pulmonary: LCTA Neuro:  + generalized weakness,  + cognitive impairment Psych: non-anxious affect, Alert, oriented to self, answering questions Thank you for the opportunity to participate in the care of Cory. Harvey. Please call our office at [phone] if we can be of additional assistance.   Lexine Jaspers [place], NP
